# Patient Record
Sex: Female | Born: 1935 | Race: White | Hispanic: No | Smoking: Never smoker
Health system: Southern US, Community
[De-identification: ages and names within clinical notes are randomized; demographics above are authoritative.]

## PROBLEM LIST (undated history)

## (undated) DIAGNOSIS — N189 Chronic kidney disease, unspecified: Secondary | ICD-10-CM

## (undated) DIAGNOSIS — R519 Headache, unspecified: Secondary | ICD-10-CM

## (undated) DIAGNOSIS — N183 Chronic kidney disease, stage 3 unspecified: Secondary | ICD-10-CM

## (undated) DIAGNOSIS — J189 Pneumonia, unspecified organism: Secondary | ICD-10-CM

## (undated) DIAGNOSIS — R001 Bradycardia, unspecified: Secondary | ICD-10-CM

## (undated) DIAGNOSIS — R011 Cardiac murmur, unspecified: Secondary | ICD-10-CM

## (undated) DIAGNOSIS — I10 Essential (primary) hypertension: Secondary | ICD-10-CM

## (undated) DIAGNOSIS — C801 Malignant (primary) neoplasm, unspecified: Secondary | ICD-10-CM

## (undated) DIAGNOSIS — M1990 Unspecified osteoarthritis, unspecified site: Secondary | ICD-10-CM

## (undated) DIAGNOSIS — R51 Headache: Secondary | ICD-10-CM

## (undated) DIAGNOSIS — E039 Hypothyroidism, unspecified: Secondary | ICD-10-CM

## (undated) DIAGNOSIS — N179 Acute kidney failure, unspecified: Secondary | ICD-10-CM

## (undated) DIAGNOSIS — Z9889 Other specified postprocedural states: Secondary | ICD-10-CM

## (undated) DIAGNOSIS — C4490 Unspecified malignant neoplasm of skin, unspecified: Secondary | ICD-10-CM

## (undated) DIAGNOSIS — K219 Gastro-esophageal reflux disease without esophagitis: Secondary | ICD-10-CM

## (undated) DIAGNOSIS — N390 Urinary tract infection, site not specified: Secondary | ICD-10-CM

## (undated) DIAGNOSIS — R112 Nausea with vomiting, unspecified: Secondary | ICD-10-CM

## (undated) DIAGNOSIS — I248 Other forms of acute ischemic heart disease: Secondary | ICD-10-CM

## (undated) HISTORY — PX: COLONOSCOPY: SHX174

## (undated) HISTORY — PX: JOINT REPLACEMENT: SHX530

## (undated) HISTORY — PX: EYE SURGERY: SHX253

## (undated) HISTORY — DX: Chronic kidney disease, stage 3 unspecified: N18.30

## (undated) HISTORY — DX: Chronic kidney disease, stage 3 (moderate): N18.3

## (undated) HISTORY — DX: Cardiac murmur, unspecified: R01.1

## (undated) HISTORY — PX: TONSILLECTOMY: SUR1361

## (undated) HISTORY — PX: VAGINAL HYSTERECTOMY: SUR661

## (undated) HISTORY — PX: BILATERAL CARPAL TUNNEL RELEASE: SHX6508

---

## 1999-06-07 ENCOUNTER — Inpatient Hospital Stay (HOSPITAL_COMMUNITY): Admission: RE | Admit: 1999-06-07 | Discharge: 1999-06-11 | Payer: Self-pay | Admitting: Orthopedic Surgery

## 1999-06-07 ENCOUNTER — Encounter: Payer: Self-pay | Admitting: Orthopedic Surgery

## 2000-10-24 ENCOUNTER — Other Ambulatory Visit: Admission: RE | Admit: 2000-10-24 | Discharge: 2000-10-24 | Payer: Self-pay | Admitting: *Deleted

## 2004-02-16 ENCOUNTER — Ambulatory Visit: Payer: Self-pay | Admitting: Critical Care Medicine

## 2004-03-17 ENCOUNTER — Ambulatory Visit: Payer: Self-pay | Admitting: Critical Care Medicine

## 2004-05-25 ENCOUNTER — Ambulatory Visit: Payer: Self-pay | Admitting: Critical Care Medicine

## 2005-05-04 ENCOUNTER — Encounter: Admission: RE | Admit: 2005-05-04 | Discharge: 2005-05-04 | Payer: Self-pay | Admitting: Endocrinology

## 2007-08-18 ENCOUNTER — Encounter: Admission: RE | Admit: 2007-08-18 | Discharge: 2007-08-18 | Payer: Self-pay | Admitting: Specialist

## 2007-08-21 ENCOUNTER — Emergency Department (HOSPITAL_COMMUNITY): Admission: EM | Admit: 2007-08-21 | Discharge: 2007-08-21 | Payer: Self-pay | Admitting: Emergency Medicine

## 2010-07-28 NOTE — Op Note (Signed)
[place]. [facility]  Patient:    GRACLYNN, VANANTWERP                         MRN: [id number] Proc. Date: 06/07/99 Adm. Date:  [id number] Attending:  Twana First                           Operative Report  PREOPERATIVE DIAGNOSIS:  Left knee degenerative joint disease.  POSTOPERATIVE DIAGNOSIS:  Left knee degenerative joint disease.  PROCEDURES: 1. Left total knee replacement using Osteonics Scorpio total knee system with #5    cemented femur, #5 cemented tibial tray with 12 mm polyethylene spacer with    26 mm polyethylene cemented patella. 2. Left knee lateral retinacular release.  SURGEON:  Elana Alm. Thurston Hole, M.D.  ASSISTANT:  Kirstin Adelberger, P.A.  ANESTHESIA:  General.  OPERATIVE TIME:  1 hour and 25 minutes.  COMPLICATIONS:  None.  DESCRIPTION OF PROCEDURE:  Ms. Hedman was brought to the operating room on June 07, 1999, placed on the operating table in supine position.  After an adequate level of general anesthesia was obtained, she had Foley catheter placed under sterile conditions.  She received 1 g IV Ancef preoperatively for prophylaxis. Her left knee was examined under anesthesia.  Range of motion from -3 to 115 degrees, mild varus deformity.  Knee stable ligamentous examination.  After this was done, knee was prepped using sterile Betadine and draped using sterile technique. Leg was exsanguinated and a thigh tourniquet elevated 350 mm.  Initially, through a 25 cm longitudinal anterior incision, initial exposure was made.  The underlying subcutaneous tissues were incised in line with the skin incision.  A median arthrotomy was performed revealing an excessive amount of normal-appearing joint fluid.   Found to have significant degenerative changes, grade 4 changes medially and in the patellofemoral joint, grade 3 changes laterally.  The medial and lateral meniscal remnants were removed, as well as, the ACL, as well as,  the osteophytes off the femoral condyles and the patella.  Intramedullary drill was then drilled up the femoral canal for placement of the distal femoral cutting jig, which was placed in the approximately amount of rotation and set and then a distal 12 mm cut was  made removing the distal femoral condyles.  The distal femur was then incised. A #5 was felt to be the appropriate size, a #5 cutting jig placed and then the anterior, posterior and champhor cuts were made.   The proximal tibia was exposed. The tibial spines were removed with an oscillating saw.  Proximal tibia was sized. A #5 was felt to be the appropriate size.  An intramedullary drill drilled down the proximal tibial canal for placement of the proximal tibial cutting jig, which was then placed and a 4 mm proximal cut was made in the appropriate amount of rotation. At this point, the Scorpio PCL-sacrificing cutter was placed on the femur and these cuts were made without complications.  The #5 femoral trial was then placed, the #5 tibial trial was then placed with a 12 mm polyethylene spacer.  Found to be an excellent fit.  Range of motion zero to 125 degrees with excellent stability and restoration of normal alignment.  The tibial tray was then marked for rotation nd then the Keel cut was made.  The patella was then sized.  A 26 mm was felt to be the appropriate size and  a recessed 10 mm x 26 mm cut was made and three locking holes placed.  Excess bone removed from around the edges.  At this point, it was felt that all the trial components were of excellent size, fit and stability. he knee was then jet lavage irrigated with 3 L of saline solution.  The proximal tibial was then exposed.  The actual #5 tibial base plate with cement backing was hammered into position with an excellent fit.  Excess cement being removed from  around the edges.  The femoral component was hammered into position, also with n excellent  fit with excess cement being removed from around the edges.  The 12 mm polyethylene spacer was locked on the tibial base plate and the knee taken through a range of motion zero to 120 degrees with no lift off on the tray and excellent stability and norma alignment.  The patella button was locked into its recess hole and held there with a clamp.  Again, with excess cement being removed.  After all the cement hardened, patellofemoral tracking was evaluated.  There was some excessive tightness laterally and thus a lateral retinacular release was carried out improving patellofemoral tracking to normal.  At this point, it was felt that all of the components were of excellent size, fit and stability.  The wound was  further irrigated and then closed using #2 Panocryl suture over two medium Hemovac drains.  Subcutaneous tissues were closed with 0 and 2-0 Vicryl.  Skin closed with skin staples.  Sterile dressings were applied.  The Hemovac injected with 0.25%  Marcaine with epinephrine and then clamped.  The patient then turned lateral, where an epidural catheter was placed for postoperative pain control.  She was then awakened and taken to recovery room in stable condition.  Needle and sponge counts were correct x 2 at the end of the case. DD:  06/07/99 TD:  06/07/99 Job: [id number]

## 2010-07-28 NOTE — Discharge Summary (Signed)
[place]. [facility]  Patient:    April Hodges, April Hodges                           MRN: [id number] Adm. Date:  06/07/99 Disc. Date: 06/11/99 Attending:  Elana Alm. Thurston Hole, M.D. Dictator:   Product manager, P.A.                           Discharge Summary  ADMITTING DIAGNOSES:  1. End-stage degenerative joint disease, left knee.  2. Hypertension.  3. Hypothyroidism.   DISCHARGE DIAGNOSES:  1. End-stage degenerative joint disease of left knee, status post total knee     replacement.  2. Hypothyroidism.  3. Hypertension.  HISTORY OF PRESENT ILLNESS: The patient is a 77 year old white female who has end-stage DJD of her left knee, and continues to have pain with activity and pain at rest despite anti-inflammatories and cortisone injection.  She understands the risks and benefits and possible complications of a total knee replacement and is without questions.  PROCEDURES WHILE IN-HOUSE: On June 07, 1999 the patient underwent left total knee replacement.  HOSPITAL COURSE: The patient was admitted postoperatively for pain control, physical therapy, and DVT prophylaxis.  On postoperative day #1 the patient was up with physical therapy and tolerated this well.  She was begun on Coumadin.  Hemoglobin was 10.4.  On postoperative day #2 hemoglobin was once again 10.4 and on postoperative day #3 hemoglobin was 10.3.  On postoperative day #4 hemoglobin was 10.9.  The patient tolerated physical therapy well and was up on a walker on postoperative day #1 and ambulating well.  On postoperative day #3 the patient continued to improve and discharge planning was begun.  On postoperative day #4 the patient was discharged home.  DISCHARGE MEDICATIONS:  1. Home medications.  2. Percocet 1-2 p.o. q.4h to q.6h p.r.n. pain.  3. Colace 100 mg 1 p.o. b.i.d. with food.  4. Coumadin.  DISCHARGE INSTRUCTIONS: She had a home CPM, home health PT, and home health R.N. for PT  draws.  FOLLOW-UP: She will follow up in our office in two weeks.  DISPOSITION: She was discharged home.  DISCHARGE CONDITION: Stable. DD:  07/05/99 TD:  07/05/99 Job: [id number]

## 2010-07-28 NOTE — Procedures (Signed)
[place]. [facility]  Patient:    April Hodges, April Hodges                         MRN: [id number] Proc. Date: 06/07/99 Adm. Date:  [id number] Attending:  Twana First CC:         Anesthesia Dept                           Procedure Report  I was consulted by Elana Alm. Thurston Hole, M.D. to provide postoperative pain relief or his patient, [name].  We decided this should take the form of epidural catheter placement and postoperative monitoring by the anesthesiologist.  The risks and benefits of the procedure were discussed with the patient in the preoperative period.  The patient was aware of the risks and benefits and agreeable to the procedure.  At the completion of the operative procedure, the patient was turned to the left lateral decubitus position.  The back was prepped with Betadine solution x 3. rom a sterile epidural tray, a #17 gauge Tuohy needle was used in a paramedian approach at the L1-2 interspace.  A loss of resistance technique with air was used. The epidural space was located easily on the first attempt.  A Buerhenne catheter was passed 5 cm into the epidural space.  The needle was removed without apparent movement of the catheter.  The catheter was aspirated.  There was no evidence of cerebrospinal fluid or blood.  The catheter was injected with a solution containing 5 cc of 1% lidocaine plain and 10 cc of sterile preservative free normal saline. The catheter injected easily.  Repeat aspiration revealed no evidence of cerebrospinal fluid or blood.  The catheter was taped securely in place.  The patient was placed in the supine position and emerged uneventfully from the anesthetic.  The patient was brought to the recovery in good condition. DD:  06/07/99 TD:  06/07/99 Job: [id number]

## 2010-12-07 LAB — CBC
HCT: 42.3
Hemoglobin: 14.6
MCHC: 34.6
MCV: 90.7
Platelets: 201
RBC: 4.67
RDW: 13
WBC: 6.2

## 2010-12-07 LAB — BASIC METABOLIC PANEL
BUN: 27 — ABNORMAL HIGH
CO2: 26
Calcium: 9.8
Chloride: 103
Creatinine, Ser: 0.96
GFR calc Af Amer: >60
GFR calc non Af Amer: 56 — ABNORMAL LOW
Glucose, Bld: 100 — ABNORMAL HIGH
Potassium: 4.3
Sodium: 140

## 2010-12-07 LAB — POCT CARDIAC MARKERS
CKMB, poc: 1.6
Myoglobin, poc: 121
Operator id: 264031
Troponin i, poc: <0.05

## 2010-12-07 LAB — DIFFERENTIAL
Basophils Absolute: 0
Basophils Relative: 0
Eosinophils Absolute: 0
Eosinophils Relative: 1
Lymphocytes Relative: 19
Lymphs Abs: 1.2
Monocytes Absolute: 0.3
Monocytes Relative: 5
Neutro Abs: 4.6
Neutrophils Relative %: 75

## 2013-12-01 ENCOUNTER — Other Ambulatory Visit (HOSPITAL_COMMUNITY): Payer: Self-pay | Admitting: Geriatric Medicine

## 2013-12-01 ENCOUNTER — Ambulatory Visit (HOSPITAL_COMMUNITY): Payer: Medicare Other | Attending: Cardiology | Admitting: Radiology

## 2013-12-01 DIAGNOSIS — R011 Cardiac murmur, unspecified: Secondary | ICD-10-CM

## 2013-12-01 DIAGNOSIS — I379 Nonrheumatic pulmonary valve disorder, unspecified: Secondary | ICD-10-CM | POA: Insufficient documentation

## 2013-12-01 DIAGNOSIS — I10 Essential (primary) hypertension: Secondary | ICD-10-CM | POA: Insufficient documentation

## 2013-12-01 DIAGNOSIS — E7800 Pure hypercholesterolemia, unspecified: Secondary | ICD-10-CM | POA: Diagnosis not present

## 2013-12-01 DIAGNOSIS — E039 Hypothyroidism, unspecified: Secondary | ICD-10-CM | POA: Diagnosis not present

## 2013-12-01 NOTE — Progress Notes (Signed)
Echocardiogram performed.  

## 2014-02-22 ENCOUNTER — Other Ambulatory Visit: Payer: Self-pay | Admitting: Physical Medicine and Rehabilitation

## 2014-02-22 DIAGNOSIS — M48061 Spinal stenosis, lumbar region without neurogenic claudication: Secondary | ICD-10-CM

## 2014-02-23 ENCOUNTER — Ambulatory Visit
Admission: RE | Admit: 2014-02-23 | Discharge: 2014-02-23 | Disposition: A | Discharge status: Home / Self Care | Payer: Medicare Other | Source: Ambulatory Visit | Attending: Physical Medicine and Rehabilitation | Admitting: Physical Medicine and Rehabilitation

## 2014-02-23 DIAGNOSIS — M48061 Spinal stenosis, lumbar region without neurogenic claudication: Secondary | ICD-10-CM

## 2014-02-23 MED ORDER — ONDANSETRON HCL 4 MG/2ML IJ SOLN
4.0000 mg | Freq: Once | INTRAMUSCULAR | Status: AC
  Administered 2014-02-23: 4 mg via INTRAMUSCULAR

## 2014-02-23 MED ORDER — METHYLPREDNISOLONE ACETATE 40 MG/ML INJ SUSP (RADIOLOG
120.0000 mg | Freq: Once | INTRAMUSCULAR | Status: AC
  Administered 2014-02-23: 120 mg via EPIDURAL

## 2014-02-23 MED ORDER — IOHEXOL 180 MG/ML  SOLN
1.0000 mL | Freq: Once | INTRAMUSCULAR | Status: AC | PRN
  Administered 2014-02-23: 1 mL via EPIDURAL

## 2014-02-23 MED ORDER — MEPERIDINE HCL 100 MG/ML IJ SOLN
75.0000 mg | Freq: Once | INTRAMUSCULAR | Status: AC
  Administered 2014-02-23: 75 mg via INTRAMUSCULAR

## 2014-02-23 NOTE — Discharge Instructions (Signed)

## 2014-06-09 ENCOUNTER — Encounter (HOSPITAL_COMMUNITY): Payer: Self-pay | Admitting: *Deleted

## 2014-06-09 MED ORDER — MUPIROCIN 2 % EX OINT
1.0000 "application " | TOPICAL_OINTMENT | Freq: Once | CUTANEOUS | Status: AC
  Administered 2014-06-10: 1 via TOPICAL
  Filled 2014-06-09: qty 22

## 2014-06-09 MED ORDER — CEFAZOLIN SODIUM-DEXTROSE 2-3 GM-% IV SOLR
2.0000 g | INTRAVENOUS | Status: AC
  Administered 2014-06-10: 2 g via INTRAVENOUS
  Filled 2014-06-09: qty 50

## 2014-06-10 ENCOUNTER — Inpatient Hospital Stay (HOSPITAL_COMMUNITY)
Admission: RE | Admit: 2014-06-10 | Discharge: 2014-06-14 | DRG: 519 | Disposition: A | Discharge status: Skilled Nursing Facility | Payer: Medicare Other | Source: Ambulatory Visit | Attending: Orthopedic Surgery | Admitting: Orthopedic Surgery

## 2014-06-10 ENCOUNTER — Inpatient Hospital Stay (HOSPITAL_COMMUNITY): Payer: Medicare Other | Admitting: Anesthesiology

## 2014-06-10 ENCOUNTER — Other Ambulatory Visit (HOSPITAL_COMMUNITY): Payer: Self-pay

## 2014-06-10 ENCOUNTER — Encounter (HOSPITAL_COMMUNITY)
Admission: RE | Disposition: A | Discharge status: Skilled Nursing Facility | Payer: Self-pay | Source: Ambulatory Visit | Attending: Orthopedic Surgery

## 2014-06-10 ENCOUNTER — Inpatient Hospital Stay (HOSPITAL_COMMUNITY): Payer: Medicare Other

## 2014-06-10 ENCOUNTER — Encounter (HOSPITAL_COMMUNITY): Payer: Self-pay | Admitting: *Deleted

## 2014-06-10 DIAGNOSIS — Z96653 Presence of artificial knee joint, bilateral: Secondary | ICD-10-CM | POA: Diagnosis present

## 2014-06-10 DIAGNOSIS — M4316 Spondylolisthesis, lumbar region: Secondary | ICD-10-CM | POA: Diagnosis present

## 2014-06-10 DIAGNOSIS — E039 Hypothyroidism, unspecified: Secondary | ICD-10-CM | POA: Diagnosis present

## 2014-06-10 DIAGNOSIS — R001 Bradycardia, unspecified: Secondary | ICD-10-CM | POA: Diagnosis present

## 2014-06-10 DIAGNOSIS — Z85828 Personal history of other malignant neoplasm of skin: Secondary | ICD-10-CM

## 2014-06-10 DIAGNOSIS — Z90711 Acquired absence of uterus with remaining cervical stump: Secondary | ICD-10-CM | POA: Diagnosis present

## 2014-06-10 DIAGNOSIS — M5416 Radiculopathy, lumbar region: Secondary | ICD-10-CM | POA: Diagnosis present

## 2014-06-10 DIAGNOSIS — Y658 Other specified misadventures during surgical and medical care: Secondary | ICD-10-CM | POA: Diagnosis not present

## 2014-06-10 DIAGNOSIS — K219 Gastro-esophageal reflux disease without esophagitis: Secondary | ICD-10-CM | POA: Diagnosis present

## 2014-06-10 DIAGNOSIS — Y92234 Operating room of hospital as the place of occurrence of the external cause: Secondary | ICD-10-CM

## 2014-06-10 DIAGNOSIS — G960 Cerebrospinal fluid leak: Secondary | ICD-10-CM | POA: Diagnosis not present

## 2014-06-10 DIAGNOSIS — R110 Nausea: Secondary | ICD-10-CM | POA: Diagnosis not present

## 2014-06-10 DIAGNOSIS — I10 Essential (primary) hypertension: Secondary | ICD-10-CM | POA: Diagnosis present

## 2014-06-10 DIAGNOSIS — K5900 Constipation, unspecified: Secondary | ICD-10-CM | POA: Diagnosis not present

## 2014-06-10 DIAGNOSIS — B029 Zoster without complications: Secondary | ICD-10-CM | POA: Diagnosis present

## 2014-06-10 DIAGNOSIS — Z79899 Other long term (current) drug therapy: Secondary | ICD-10-CM

## 2014-06-10 DIAGNOSIS — M549 Dorsalgia, unspecified: Secondary | ICD-10-CM | POA: Diagnosis present

## 2014-06-10 DIAGNOSIS — M4806 Spinal stenosis, lumbar region: Secondary | ICD-10-CM | POA: Diagnosis present

## 2014-06-10 DIAGNOSIS — G9781 Other intraoperative complications of nervous system: Secondary | ICD-10-CM | POA: Diagnosis not present

## 2014-06-10 DIAGNOSIS — Z419 Encounter for procedure for purposes other than remedying health state, unspecified: Secondary | ICD-10-CM

## 2014-06-10 HISTORY — DX: Gastro-esophageal reflux disease without esophagitis: K21.9

## 2014-06-10 HISTORY — DX: Other specified postprocedural states: Z98.890

## 2014-06-10 HISTORY — DX: Pneumonia, unspecified organism: J18.9

## 2014-06-10 HISTORY — DX: Headache, unspecified: R51.9

## 2014-06-10 HISTORY — DX: Malignant (primary) neoplasm, unspecified: C80.1

## 2014-06-10 HISTORY — DX: Bradycardia, unspecified: R00.1

## 2014-06-10 HISTORY — DX: Unspecified osteoarthritis, unspecified site: M19.90

## 2014-06-10 HISTORY — DX: Hypothyroidism, unspecified: E03.9

## 2014-06-10 HISTORY — PX: LUMBAR LAMINECTOMY/DECOMPRESSION MICRODISCECTOMY: SHX5026

## 2014-06-10 HISTORY — DX: Headache: R51

## 2014-06-10 HISTORY — DX: Other specified postprocedural states: R11.2

## 2014-06-10 LAB — BASIC METABOLIC PANEL
Anion gap: 11 (ref 5–15)
BUN: 27 mg/dL — AB (ref 6–23)
CHLORIDE: 105 mmol/L (ref 96–112)
CO2: 24 mmol/L (ref 19–32)
CREATININE: 1.02 mg/dL (ref 0.50–1.10)
Calcium: 9.9 mg/dL (ref 8.4–10.5)
GFR calc Af Amer: 56 mL/min — ABNORMAL LOW (ref >90)
GFR calc non Af Amer: 48 mL/min — ABNORMAL LOW (ref >90)
Glucose, Bld: 96 mg/dL (ref 70–99)
Potassium: 4.2 mmol/L (ref 3.5–5.1)
Sodium: 140 mmol/L (ref 135–145)

## 2014-06-10 LAB — CBC
HCT: 43.1 % (ref 36.0–46.0)
Hemoglobin: 14.2 g/dL (ref 12.0–15.0)
MCH: 31.3 pg (ref 26.0–34.0)
MCHC: 32.9 g/dL (ref 30.0–36.0)
MCV: 94.9 fL (ref 78.0–100.0)
PLATELETS: 198 10*3/uL (ref 150–400)
RBC: 4.54 MIL/uL (ref 3.87–5.11)
RDW: 12.8 % (ref 11.5–15.5)
WBC: 5.6 10*3/uL (ref 4.0–10.5)

## 2014-06-10 LAB — SURGICAL PCR SCREEN
MRSA, PCR: NEGATIVE
Staphylococcus aureus: POSITIVE — AB

## 2014-06-10 SURGERY — LUMBAR LAMINECTOMY/DECOMPRESSION MICRODISCECTOMY 2 LEVELS
Anesthesia: General | Site: Back

## 2014-06-10 MED ORDER — GLYCOPYRROLATE 0.2 MG/ML IJ SOLN
INTRAMUSCULAR | Status: AC
  Filled 2014-06-10: qty 2

## 2014-06-10 MED ORDER — HYDROMORPHONE HCL 1 MG/ML IJ SOLN
INTRAMUSCULAR | Status: AC
  Administered 2014-06-10: 0.5 mg via INTRAVENOUS
  Filled 2014-06-10: qty 1

## 2014-06-10 MED ORDER — LACTATED RINGERS IV SOLN: INTRAVENOUS | Status: DC

## 2014-06-10 MED ORDER — FENTANYL CITRATE 0.05 MG/ML IJ SOLN
INTRAMUSCULAR | Status: AC
  Filled 2014-06-10: qty 5

## 2014-06-10 MED ORDER — LACTATED RINGERS IV SOLN
INTRAVENOUS | Status: DC | PRN
  Administered 2014-06-10 (×2): via INTRAVENOUS

## 2014-06-10 MED ORDER — NEOSTIGMINE METHYLSULFATE 10 MG/10ML IV SOLN
INTRAVENOUS | Status: AC
  Filled 2014-06-10: qty 1

## 2014-06-10 MED ORDER — ONDANSETRON HCL 4 MG/2ML IJ SOLN
4.0000 mg | INTRAMUSCULAR | Status: DC | PRN
  Administered 2014-06-10: 4 mg via INTRAVENOUS
  Filled 2014-06-10: qty 2

## 2014-06-10 MED ORDER — ACETAMINOPHEN 10 MG/ML IV SOLN
1000.0000 mg | Freq: Four times a day (QID) | INTRAVENOUS | Status: AC
  Administered 2014-06-10 – 2014-06-11 (×4): 1000 mg via INTRAVENOUS
  Filled 2014-06-10 (×4): qty 100

## 2014-06-10 MED ORDER — MENTHOL 3 MG MT LOZG: 1.0000 | LOZENGE | OROMUCOSAL | Status: DC | PRN

## 2014-06-10 MED ORDER — OXYCODONE HCL 5 MG PO TABS
10.0000 mg | ORAL_TABLET | ORAL | Status: DC | PRN
  Administered 2014-06-11: 5 mg via ORAL
  Administered 2014-06-11: 10 mg via ORAL
  Filled 2014-06-10 (×2): qty 2

## 2014-06-10 MED ORDER — EPHEDRINE SULFATE 50 MG/ML IJ SOLN
INTRAMUSCULAR | Status: DC | PRN
  Administered 2014-06-10 (×3): 10 mg via INTRAVENOUS

## 2014-06-10 MED ORDER — GLYCOPYRROLATE 0.2 MG/ML IJ SOLN
INTRAMUSCULAR | Status: DC | PRN
  Administered 2014-06-10: 0.4 mg via INTRAVENOUS

## 2014-06-10 MED ORDER — ONDANSETRON HCL 4 MG/2ML IJ SOLN: 4.0000 mg | Freq: Once | INTRAMUSCULAR | Status: DC | PRN

## 2014-06-10 MED ORDER — FENTANYL CITRATE 0.05 MG/ML IJ SOLN
INTRAMUSCULAR | Status: DC | PRN
  Administered 2014-06-10: 25 ug via INTRAVENOUS
  Administered 2014-06-10: 50 ug via INTRAVENOUS
  Administered 2014-06-10 (×2): 100 ug via INTRAVENOUS
  Administered 2014-06-10: 50 ug via INTRAVENOUS

## 2014-06-10 MED ORDER — DEXAMETHASONE 4 MG PO TABS
4.0000 mg | ORAL_TABLET | Freq: Four times a day (QID) | ORAL | Status: DC
  Administered 2014-06-11 – 2014-06-12 (×3): 4 mg via ORAL
  Filled 2014-06-10 (×4): qty 1

## 2014-06-10 MED ORDER — LIDOCAINE HCL (CARDIAC) 20 MG/ML IV SOLN
INTRAVENOUS | Status: DC | PRN
  Administered 2014-06-10: 100 mg via INTRAVENOUS

## 2014-06-10 MED ORDER — METHOCARBAMOL 1000 MG/10ML IJ SOLN
500.0000 mg | Freq: Four times a day (QID) | INTRAVENOUS | Status: DC | PRN
  Administered 2014-06-10: 500 mg via INTRAVENOUS
  Filled 2014-06-10 (×3): qty 5

## 2014-06-10 MED ORDER — SODIUM CHLORIDE 0.9 % IV SOLN
250.0000 mL | INTRAVENOUS | Status: DC
  Administered 2014-06-12: 250 mL via INTRAVENOUS

## 2014-06-10 MED ORDER — ONDANSETRON HCL 4 MG/2ML IJ SOLN
INTRAMUSCULAR | Status: DC | PRN
  Administered 2014-06-10: 4 mg via INTRAVENOUS

## 2014-06-10 MED ORDER — NEOSTIGMINE METHYLSULFATE 10 MG/10ML IV SOLN
INTRAVENOUS | Status: DC | PRN
  Administered 2014-06-10: 3 mg via INTRAVENOUS

## 2014-06-10 MED ORDER — LACTATED RINGERS IV SOLN
INTRAVENOUS | Status: DC
  Administered 2014-06-10: 11:00:00 via INTRAVENOUS

## 2014-06-10 MED ORDER — DEXAMETHASONE SODIUM PHOSPHATE 4 MG/ML IJ SOLN
4.0000 mg | Freq: Four times a day (QID) | INTRAMUSCULAR | Status: DC
  Administered 2014-06-10 – 2014-06-12 (×4): 4 mg via INTRAVENOUS
  Filled 2014-06-10 (×4): qty 1

## 2014-06-10 MED ORDER — HEMOSTATIC AGENTS (NO CHARGE) OPTIME
TOPICAL | Status: DC | PRN
  Administered 2014-06-10: 1 via TOPICAL

## 2014-06-10 MED ORDER — PHENYLEPHRINE HCL 10 MG/ML IJ SOLN
INTRAMUSCULAR | Status: DC | PRN
  Administered 2014-06-10 (×2): 40 ug via INTRAVENOUS
  Administered 2014-06-10: 80 ug via INTRAVENOUS

## 2014-06-10 MED ORDER — PHENOL 1.4 % MT LIQD: 1.0000 | OROMUCOSAL | Status: DC | PRN

## 2014-06-10 MED ORDER — AMLODIPINE BESYLATE 2.5 MG PO TABS
2.5000 mg | ORAL_TABLET | Freq: Every day | ORAL | Status: DC
  Administered 2014-06-11 – 2014-06-14 (×4): 2.5 mg via ORAL
  Filled 2014-06-10 (×5): qty 1

## 2014-06-10 MED ORDER — PROPOFOL 10 MG/ML IV BOLUS
INTRAVENOUS | Status: DC | PRN
Start: 2014-06-10 — End: 2014-06-10
  Administered 2014-06-10: 120 mg via INTRAVENOUS

## 2014-06-10 MED ORDER — THROMBIN 20000 UNITS EX SOLR
CUTANEOUS | Status: DC | PRN
  Administered 2014-06-10: 20000 [IU] via TOPICAL

## 2014-06-10 MED ORDER — ONDANSETRON HCL 4 MG/2ML IJ SOLN
INTRAMUSCULAR | Status: AC
  Filled 2014-06-10: qty 2

## 2014-06-10 MED ORDER — HYDROMORPHONE HCL 1 MG/ML IJ SOLN
0.2500 mg | INTRAMUSCULAR | Status: DC | PRN
  Administered 2014-06-10 (×4): 0.5 mg via INTRAVENOUS

## 2014-06-10 MED ORDER — THROMBIN 20000 UNITS EX SOLR
CUTANEOUS | Status: AC
  Filled 2014-06-10: qty 20000

## 2014-06-10 MED ORDER — PHENYLEPHRINE 40 MCG/ML (10ML) SYRINGE FOR IV PUSH (FOR BLOOD PRESSURE SUPPORT)
PREFILLED_SYRINGE | INTRAVENOUS | Status: AC
  Filled 2014-06-10: qty 10

## 2014-06-10 MED ORDER — METHOCARBAMOL 500 MG PO TABS
500.0000 mg | ORAL_TABLET | Freq: Four times a day (QID) | ORAL | Status: DC | PRN
  Administered 2014-06-12 – 2014-06-13 (×2): 500 mg via ORAL
  Filled 2014-06-10 (×3): qty 1

## 2014-06-10 MED ORDER — MEPERIDINE HCL 25 MG/ML IJ SOLN: 6.2500 mg | INTRAMUSCULAR | Status: DC | PRN

## 2014-06-10 MED ORDER — ROCURONIUM BROMIDE 100 MG/10ML IV SOLN
INTRAVENOUS | Status: DC | PRN
  Administered 2014-06-10: 50 mg via INTRAVENOUS

## 2014-06-10 MED ORDER — LEVOTHYROXINE SODIUM 125 MCG PO TABS
125.0000 ug | ORAL_TABLET | Freq: Every day | ORAL | Status: DC
  Administered 2014-06-12: 125 ug via ORAL
  Filled 2014-06-10: qty 1

## 2014-06-10 MED ORDER — BUPIVACAINE-EPINEPHRINE (PF) 0.25% -1:200000 IJ SOLN
INTRAMUSCULAR | Status: DC | PRN
  Administered 2014-06-10: 10 mL via PERINEURAL

## 2014-06-10 MED ORDER — PROMETHAZINE HCL 25 MG/ML IJ SOLN
12.5000 mg | Freq: Four times a day (QID) | INTRAMUSCULAR | Status: DC | PRN
  Administered 2014-06-10: 12.5 mg via INTRAVENOUS
  Filled 2014-06-10: qty 1

## 2014-06-10 MED ORDER — MORPHINE SULFATE 2 MG/ML IJ SOLN
1.0000 mg | INTRAMUSCULAR | Status: DC | PRN
  Filled 2014-06-10: qty 1

## 2014-06-10 MED ORDER — SODIUM CHLORIDE 0.9 % IJ SOLN
3.0000 mL | Freq: Two times a day (BID) | INTRAMUSCULAR | Status: DC
  Filled 2014-06-10: qty 3

## 2014-06-10 MED ORDER — BUPIVACAINE-EPINEPHRINE (PF) 0.25% -1:200000 IJ SOLN
INTRAMUSCULAR | Status: AC
  Filled 2014-06-10: qty 30

## 2014-06-10 MED ORDER — CEFAZOLIN SODIUM 1-5 GM-% IV SOLN
1.0000 g | Freq: Three times a day (TID) | INTRAVENOUS | Status: AC
  Administered 2014-06-10 – 2014-06-11 (×2): 1 g via INTRAVENOUS
  Filled 2014-06-10 (×2): qty 50

## 2014-06-10 MED ORDER — SODIUM CHLORIDE 0.9 % IJ SOLN: 3.0000 mL | INTRAMUSCULAR | Status: DC | PRN

## 2014-06-10 SURGICAL SUPPLY — 67 items
APL SRG 60D 8 XTD TIP BNDBL (TIP) ×1
BNDG GAUZE ELAST 4 BULKY (GAUZE/BANDAGES/DRESSINGS) ×3 IMPLANT
BUR EGG ELITE 4.0 (BURR) ×1 IMPLANT
BUR EGG ELITE 4.0MM (BURR) ×1
CLOSURE STERI-STRIP 1/2X4 (GAUZE/BANDAGES/DRESSINGS) ×2
CLOSURE WOUND 1/2 X4 (GAUZE/BANDAGES/DRESSINGS)
CLSR STERI-STRIP ANTIMIC 1/2X4 (GAUZE/BANDAGES/DRESSINGS) ×2 IMPLANT
CORDS BIPOLAR (ELECTRODE) ×3 IMPLANT
DRAIN CHANNEL 15F RND FF W/TCR (WOUND CARE) ×2 IMPLANT
DRAPE C-ARM 42X72 X-RAY (DRAPES) ×3 IMPLANT
DRAPE POUCH INSTRU U-SHP 10X18 (DRAPES) ×3 IMPLANT
DRAPE SURG 17X11 SM STRL (DRAPES) ×3 IMPLANT
DRAPE U-SHAPE 47X51 STRL (DRAPES) ×3 IMPLANT
DRSG MEPILEX BORDER 4X4 (GAUZE/BANDAGES/DRESSINGS) ×5 IMPLANT
DURAPREP 26ML APPLICATOR (WOUND CARE) ×3 IMPLANT
DURASEAL APPLICATOR TIP (TIP) ×2 IMPLANT
DURASEAL SPINE SEALANT 3ML (MISCELLANEOUS) ×2 IMPLANT
ELECT BLADE 4.0 EZ CLEAN MEGAD (MISCELLANEOUS) ×3
ELECT CAUTERY BLADE 6.4 (BLADE) ×3 IMPLANT
ELECT PENCIL ROCKER SW 15FT (MISCELLANEOUS) ×3 IMPLANT
ELECT REM PT RETURN 9FT ADLT (ELECTROSURGICAL) ×3
ELECTRODE BLDE 4.0 EZ CLN MEGD (MISCELLANEOUS) ×1 IMPLANT
ELECTRODE REM PT RTRN 9FT ADLT (ELECTROSURGICAL) ×1 IMPLANT
EVACUATOR SILICONE 100CC (DRAIN) ×2 IMPLANT
FLOSEAL (HEMOSTASIS) IMPLANT
GLOVE BIOGEL PI IND STRL 8 (GLOVE) ×1 IMPLANT
GLOVE BIOGEL PI IND STRL 8.5 (GLOVE) ×1 IMPLANT
GLOVE BIOGEL PI INDICATOR 8 (GLOVE) ×2
GLOVE BIOGEL PI INDICATOR 8.5 (GLOVE) ×2
GLOVE ORTHO TXT STRL SZ7.5 (GLOVE) ×3 IMPLANT
GLOVE SS BIOGEL STRL SZ 8.5 (GLOVE) ×1 IMPLANT
GLOVE SUPERSENSE BIOGEL SZ 8.5 (GLOVE) ×2
GOWN STRL REUS W/ TWL LRG LVL3 (GOWN DISPOSABLE) ×1 IMPLANT
GOWN STRL REUS W/TWL 2XL LVL3 (GOWN DISPOSABLE) ×6 IMPLANT
GOWN STRL REUS W/TWL LRG LVL3 (GOWN DISPOSABLE) ×3
GRAFT DURAGEN MATRIX 1WX1L (Tissue) ×2 IMPLANT
KIT BASIN OR (CUSTOM PROCEDURE TRAY) ×3 IMPLANT
NDL SPNL 18GX3.5 QUINCKE PK (NEEDLE) ×2 IMPLANT
NEEDLE 22X1 1/2 (OR ONLY) (NEEDLE) ×3 IMPLANT
NEEDLE SPNL 18GX3.5 QUINCKE PK (NEEDLE) ×6 IMPLANT
NS IRRIG 1000ML POUR BTL (IV SOLUTION) ×3 IMPLANT
PACK LAMINECTOMY ORTHO (CUSTOM PROCEDURE TRAY) ×3 IMPLANT
PACK UNIVERSAL I (CUSTOM PROCEDURE TRAY) ×3 IMPLANT
PATTIES SURGICAL .5 X.5 (GAUZE/BANDAGES/DRESSINGS) IMPLANT
PATTIES SURGICAL .5 X1 (DISPOSABLE) ×3 IMPLANT
SPONGE LAP 4X18 X RAY DECT (DISPOSABLE) ×2 IMPLANT
SPONGE SURGIFOAM ABS GEL 100 (HEMOSTASIS) ×3 IMPLANT
STAPLER VISISTAT 35W (STAPLE) IMPLANT
STRIP CLOSURE SKIN 1/2X4 (GAUZE/BANDAGES/DRESSINGS) IMPLANT
SURGIFLO TRUKIT (HEMOSTASIS) IMPLANT
SUT BONE WAX W31G (SUTURE) ×3 IMPLANT
SUT ETHILON 3 0 PS 1 (SUTURE) ×2 IMPLANT
SUT MON AB 3-0 SH 27 (SUTURE) ×3
SUT MON AB 3-0 SH27 (SUTURE) ×1 IMPLANT
SUT PROLENE 6 0 P 1 18 (SUTURE) ×2 IMPLANT
SUT VIC AB 1 CT1 18XCR BRD 8 (SUTURE) ×1 IMPLANT
SUT VIC AB 1 CT1 27 (SUTURE) ×6
SUT VIC AB 1 CT1 27XBRD ANTBC (SUTURE) ×2 IMPLANT
SUT VIC AB 1 CT1 8-18 (SUTURE) ×6
SUT VIC AB 2-0 CT1 18 (SUTURE) ×6 IMPLANT
SUT VICRYL 0 UR6 27IN ABS (SUTURE) ×3 IMPLANT
SYR BULB IRRIGATION 50ML (SYRINGE) ×3 IMPLANT
SYR CONTROL 10ML LL (SYRINGE) ×3 IMPLANT
TOWEL OR 17X26 10 PK STRL BLUE (TOWEL DISPOSABLE) ×6 IMPLANT
TRAY FOLEY CATH 16FRSI W/METER (SET/KITS/TRAYS/PACK) IMPLANT
WATER STERILE IRR 1000ML POUR (IV SOLUTION) ×3 IMPLANT
YANKAUER SUCT BULB TIP NO VENT (SUCTIONS) ×3 IMPLANT

## 2014-06-10 NOTE — Brief Op Note (Signed)
06/10/2014  5:51 PM  PATIENT:  April Hodges  81 y.o. female  PRE-OPERATIVE DIAGNOSIS:  spinal stenosis with slip L4 - L5  POST-OPERATIVE DIAGNOSIS:  spinal stenosis with slip L4 - L5  PROCEDURE:  Procedure(s): LUMBAR DECOMPRESSION L3 - L5 2 LEVELS (N/A)  SURGEON:  Surgeon(s) and Role:    * Melina Schools, MD - Primary  PHYSICIAN ASSISTANT:   ASSISTANTS: none   ANESTHESIA:   general  EBL:  Total I/O In: 1000 [I.V.:1000] Out: 450 [Urine:200; Blood:250]  BLOOD ADMINISTERED:none  DRAINS: 1 JP in the back   LOCAL MEDICATIONS USED:  MARCAINE     SPECIMEN:  No Specimen  DISPOSITION OF SPECIMEN:  N/A  COUNTS:  YES  TOURNIQUET:  * No tourniquets in log *  DICTATION: .Other Dictation: Dictation Number [id number]  PLAN OF CARE: Admit to inpatient   PATIENT DISPOSITION:  PACU - hemodynamically stable.   Complication: CSF leak.  Duragen patch with Duraseal    No active CSF leak afterwards - tested with valsavla to 40 for 10 sec.

## 2014-06-10 NOTE — Anesthesia Preprocedure Evaluation (Signed)
Anesthesia Evaluation  Patient identified by MRN, date of birth, ID band Patient awake    Reviewed: Allergy & Precautions, NPO status , Patient's Chart, lab work & pertinent test results  History of Anesthesia Complications (+) PONV  Airway Mallampati: I  TM Distance: >3 FB Neck ROM: Full    Dental   Pulmonary          Cardiovascular hypertension, Pt. on medications     Neuro/Psych    GI/Hepatic GERD-  Medicated and Controlled,  Endo/Other  Hypothyroidism   Renal/GU      Musculoskeletal   Abdominal   Peds  Hematology   Anesthesia Other Findings   Reproductive/Obstetrics                             Anesthesia Physical Anesthesia Plan  ASA: II  Anesthesia Plan: General   Post-op Pain Management:    Induction: Intravenous  Airway Management Planned: Oral ETT  Additional Equipment:   Intra-op Plan:   Post-operative Plan: Extubation in OR  Informed Consent: I have reviewed the patients History and Physical, chart, labs and discussed the procedure including the risks, benefits and alternatives for the proposed anesthesia with the patient or authorized representative who has indicated his/her understanding and acceptance.     Plan Discussed with: CRNA and Surgeon  Anesthesia Plan Comments:         Anesthesia Quick Evaluation

## 2014-06-10 NOTE — Transfer of Care (Signed)
Immediate Anesthesia Transfer of Care Note  Patient: April Hodges  Procedure(s) Performed: Procedure(s): LUMBAR DECOMPRESSION L3 - L5 2 LEVELS (N/A)  Patient Location: PACU  Anesthesia Type:General  Level of Consciousness: awake, alert , oriented and patient cooperative  Airway & Oxygen Therapy: Patient Spontanous Breathing and Patient connected to face mask oxygen  Post-op Assessment: Report given to RN, Post -op Vital signs reviewed and stable and Patient moving all extremities  Post vital signs: Reviewed and stable  Last Vitals:  Filed Vitals:   06/10/14 1811  BP:   Pulse:   Temp: 36.8 C  Resp:     Complications: No apparent anesthesia complications

## 2014-06-10 NOTE — H&P (Signed)
History of Present Illness The patient is a 81 year old female who presents with back pain. The patient is here today for a surgical consult (from Dr Nelva Bush). The patient reports low back and bilateral leg pain symptoms including low back pain, numbness (on/off) and tingling which began 4 month(s) ago without any known injury. and Symptoms include numbness (both legs), while symptoms do not include incontinence of stool or incontinence of urine. The pain radiates to the left foot and right foot. The patient describes the severity of their symptoms as 3 / 10 on an analog pain scale. The patient feels as if the symptoms are worsening (for several months). Symptoms are relieved by activity modification and heat packs (hot shower helps). Current treatment includes nonsteroidal anti-inflammatory drugs (Celebrex), non-opioid analgesics (Extra Strenght Tylenol) and heating pad. The patient states that the first episode of back pain that they can recall was year(s) (This pain has come and gone over the years. She has a history of shingles secondary to sudden onset of back pain.) ago. Prior to being seen today the patient was previously evaluated in this clinic (Dr Nelva Bush and chiropractor). Past evaluation has included MRI of the lumbar spine (lumbar @ [facility]). Past treatment has included chiropractic manipulation.  Subjective Transcription She presents today for evaluation.  Allergies No Known Drug Allergies12/03/2013  Family History Congestive Heart Failure Mother. Heart Disease Brother, Father, Mother. Osteoarthritis Mother.  Social History  Exercise Exercises rarely Children 3 Current work status retired No alcohol use 03-27-14 Tobacco use Never smoker. 03-27-14 Marital status widowed No history of drug/alcohol rehab Not under pain contract Current drinker 02/09/2014: Currently drinks wine only occasionally per week Living situation live alone  Medication History  Ultram (50MG  Tablet,  1 (one) Tablet Oral three times daily, as needed, Taken starting 03/08/2014) Active. CeleBREX (200MG  Capsule, Oral) Active. Aspirin (81MG  Tablet, 1 (one) Oral) Active. Multiple Vitamin (1 (one) Oral) Active. Tylenol (500MG  Capsule, 1 (one) Oral) Active. Triamterene-HCTZ (37.5-25MG  Tablet, Oral) Active. Levothyroxine Sodium (125MCG Tablet, Oral) Active. Omeprazole (20MG  Tablet DR, Oral) Active. AmLODIPine Besylate (2.5MG  Tablet, Oral) Active. Medications Reconciled  Past Surgical History  Arthroscopy of Knee left Carpal Tunnel Repair bilateral Cataract Surgery bilateral Hysterectomy partial (non-cancerous) Total Knee Replacement bilateral  Other Problems Skin Cancer Shingles last outbreak 2009 High blood pressure Hypothyroidism Osteoarthritis  Objective Transcription She is a pleasant woman who appears younger than her stated age. She is alert, she is oriented times three. No shortness of breath or chest pain. The abdomen is soft and nontender. No incontinence of bowel and bladder. She has got significant back pain with extension of the spine. No previous surgical scars on the lumbar spine. Relief with forward flexion, pain that radiates into the buttock and into both hamstrings or occasionally below the knees. Worse again with extension of the spine. Compartments are soft and nontender. Intact 1+ deep tendon dorsalis pedis and posterior tibialis pulses. 1+ deep tendon reflexes. She has got bilateral total knee replacements. No clonus. She does have difficulty maintaining her balance primarily due to the onset of pain.  Her MRI was reviewed from 04/19/14. It demonstrates significant spinal stenosis at L3-4 and L4-5. There is a grade 1 anterior listhesis at L4-5. There is severe central and lateral recess stenosis at 4-5. This is the worst of the two levels. There is mild disease at 2-3 and there is degenerative disc disease with moderate facet  arthrosis at 5-1.  Assessment & Plan  Spinal stenosis of lumbar region with  radiculopathy  Current Plans Follow up as needed Plans Transcription At this point in time clinically I think the spinal stenosis at 3-4 and 4-5 are her primary sources of pain. We have talked about a surgical solution for her pain. She had a previous epidural injection which was complicated with significant increase in her pain. She is not too thrilled with the idea of repeating the injections. At this point we have talked about a decompressive procedure. I have talked to the patient and her daughter in law. They are present for the dictation, all of their questions were addressed. The risks of surgery include infection, bleeding, nerve damage, death, stroke, paralysis, failure to heal, need for further surgery, ongoing or worse pain, loss of bowel and bladder control, migration of device, recurrent spinal stenosis, wound infection. All of their questions were encouraged and addressed. She will take some time and contact me and let me know how she would like to proceed.

## 2014-06-11 LAB — CBC
HEMATOCRIT: 39.2 % (ref 36.0–46.0)
Hemoglobin: 12.7 g/dL (ref 12.0–15.0)
MCH: 30.6 pg (ref 26.0–34.0)
MCHC: 32.4 g/dL (ref 30.0–36.0)
MCV: 94.5 fL (ref 78.0–100.0)
Platelets: 183 10*3/uL (ref 150–400)
RBC: 4.15 MIL/uL (ref 3.87–5.11)
RDW: 12.8 % (ref 11.5–15.5)
WBC: 8 10*3/uL (ref 4.0–10.5)

## 2014-06-11 MED ORDER — CEFAZOLIN SODIUM 1-5 GM-% IV SOLN
1.0000 g | Freq: Three times a day (TID) | INTRAVENOUS | Status: AC
  Administered 2014-06-12 (×2): 1 g via INTRAVENOUS
  Filled 2014-06-11 (×2): qty 50

## 2014-06-11 MED ORDER — CEFAZOLIN SODIUM 1-5 GM-% IV SOLN
1.0000 g | Freq: Three times a day (TID) | INTRAVENOUS | Status: DC
  Administered 2014-06-11: 1 g via INTRAVENOUS
  Filled 2014-06-11 (×4): qty 50

## 2014-06-11 NOTE — Progress Notes (Addendum)
    Subjective: Procedure(s) (LRB): LUMBAR DECOMPRESSION L3 - L5 2 LEVELS (N/A) 1 Day Post-Op  Patient reports pain as 3 on 0-10 scale.  Reports decreased leg pain reports incisional back pain   N/A void - foley in place Negative bowel movement Negative flatus Negative chest pain or shortness of breath  Objective: Vital signs in last 24 hours: Temp:  [97.5 F (36.4 C)-98.3 F (36.8 C)] 98.1 F (36.7 C) (04/01 0600) Pulse Rate:  [56-94] 78 (04/01 0600) Resp:  [13-18] 16 (04/01 0600) BP: (136-166)/(52-67) 147/67 mmHg (04/01 0600) SpO2:  [95 %-100 %] 95 % (04/01 0600) Weight:  [73.029 kg (161 lb)] 73.029 kg (161 lb) (03/31 1049)  Intake/Output from previous day: 03/31 0701 - 04/01 0700 In: 3818 [P.O.:120; I.V.:1500] Out: 1320 [Urine:750; Drains:320; Blood:250]  Labs:  Recent Labs  06/10/14 1031  WBC 5.6  RBC 4.54  HCT 43.1  PLT 198    Recent Labs  06/10/14 1031  NA 140  K 4.2  CL 105  CO2 24  BUN 27*  CREATININE 1.02  GLUCOSE 96  CALCIUM 9.9   No results for input(s): LABPT, INR in the last 72 hours.  Physical Exam: Neurologically intact ABD soft Intact pulses distally Incision: dressing C/D/I Compartment soft no significant headache Drain: serous output. No clear fluid - 320 since surgery.  Assessment/Plan: Patient stable  xrays n/a Continue mobilization with physical therapy Continue care  Patient stable - no focal neuro deficits.  Neuropathic leg pain improving At noon will incrementally elevate HOB to make sure no further CSF leak Continue drain given increased output Will continue IV ancef since drain will remain in place Nausea improved with phenergan   Melina Schools, MD [place] [phone]

## 2014-06-11 NOTE — Progress Notes (Signed)
Summary: Initiated @ 1200 per order - advanced HOB to flat with increasing degrees Q 30 minutes to 90 degrees x 30 minutes w/o headache or complaints. Therapy informed activity restrictions are now d/c'ed - PT went to patient room to work with her for 1st post-op therapy visit - see therapy notes.

## 2014-06-11 NOTE — Evaluation (Signed)
Physical Therapy Evaluation Patient Details Name: April Hodges MRN: [id number] DOB: [birth year] Today's Date: 06/11/2014   History of Present Illness  81 y.o. female admitted to [facility] 06/10/14 for elective lumbar decompression and fusion I9-4 with complication of CSF leak.  Pt on bedrest until noon on 06/11/14 and then allowed to mobilize OOB with therapy.  Pt with significant PMHx of HTN, sinus brady, and Bil TKA.  Clinical Impression  Pt is mobilizing well min assist with RW, short in-room distances.  She is appropriate for SNF at [facility] home before returning [facility] ([facility]).  PT to follow acutely for deficits listed below.       Follow Up Recommendations SNF    Equipment Recommendations  None recommended by PT    Recommendations for Other Services   NA    Precautions / Restrictions Precautions Precautions: Back Precaution Comments: reviewed back precautions and brace use Required Braces or Orthoses: Other Brace/Splint;Spinal Brace Spinal Brace: Lumbar corset;Applied in sitting position Other Brace/Splint: has a JP drain      Mobility  Bed Mobility Overal bed mobility: Needs Assistance Bed Mobility: Rolling;Sidelying to Sit Rolling: Min assist Sidelying to sit: Min assist       General bed mobility comments: Min assist to roll with verbal cues for log roll technique.  Min assist to support trunk to get from side lying to sitting EOB with bed rail for support.    Transfers Overall transfer level: Needs assistance Equipment used: Rolling walker (2 wheeled) Transfers: Sit to/from Stand Sit to Stand: Min assist         General transfer comment: Min assist to support trunk during transition to stand.   Ambulation/Gait Ambulation/Gait assistance: Min assist Ambulation Distance (Feet): 20 Feet Assistive device: Rolling walker (2 wheeled) Gait Pattern/deviations: Step-through pattern;Shuffle Gait velocity:  decreased   General Gait Details: Verbal cues for proximity to RW and upright posture.  Min assist to steady pt for balance during gait.          Balance Overall balance assessment: Needs assistance Sitting-balance support: Feet supported;Bilateral upper extremity supported Sitting balance-Leahy Scale: Fair     Standing balance support: Bilateral upper extremity supported Standing balance-Leahy Scale: Poor                               Pertinent Vitals/Pain Pain Assessment: 0-10 Pain Score: 5  Pain Location: buttocks and lower back Pain Descriptors / Indicators: Aching;Burning;Numbness Pain Intervention(s): Limited activity within patient's tolerance;Monitored during session;Repositioned    Home Living Family/patient expects to be discharged to:: Skilled nursing facility (lives at [place] in a house.  Plan for SNF for 1-2 weeks) Living Arrangements: Alone Available Help at Discharge: Family;Available 24 hours/day (daughter staying for a few weeks) Type of Home: House Home Access: Level entry     Home Layout: One level Home Equipment: Walker - 2 wheels;Cane - single point;Shower seat - built in;Grab bars - tub/shower;Hand held shower head Additional Comments: Has a bed rail.  Uses a golf cart to get around campus.     Prior Function Level of Independence: Independent with assistive device(s)         Comments: [facility].  Does use a cane at times.  Likes to walk her dog.      Hand Dominance   Dominant Hand: Right    Extremity/Trunk Assessment   Upper Extremity Assessment: Defer to  OT evaluation           Lower Extremity Assessment: Generalized weakness         Communication   Communication: No difficulties  Cognition Arousal/Alertness: Awake/alert Behavior During Therapy: WFL for tasks assessed/performed Overall Cognitive Status: Within Functional Limits for tasks assessed                                Assessment/Plan    PT Assessment Patient needs continued PT services  PT Diagnosis Difficulty walking;Abnormality of gait;Generalized weakness;Acute pain   PT Problem List Decreased strength;Decreased activity tolerance;Decreased balance;Decreased mobility;Decreased knowledge of use of DME;Decreased knowledge of precautions;Pain  PT Treatment Interventions DME instruction;Gait training;Functional mobility training;Therapeutic activities;Therapeutic exercise;Balance training;Neuromuscular re-education;Patient/family education;Modalities   PT Goals (Current goals can be found in the Care Plan section) Acute Rehab PT Goals Patient Stated Goal: to go home, get back to her PLOF PT Goal Formulation: With patient/family Time For Goal Achievement: 06/18/14 Potential to Achieve Goals: Good    Frequency Min 5X/week    End of Session Equipment Utilized During Treatment: Back brace Activity Tolerance: Patient limited by pain Patient left: in chair;with call bell/phone within reach;with family/visitor present Nurse Communication: Mobility status         Time: 8381-8403 PT Time Calculation (min) (ACUTE ONLY): 26 min   Charges:   PT Evaluation $Initial PT Evaluation Tier I: 1 Procedure PT Treatments $Therapeutic Activity: 8-22 mins        Lauralei Clouse B. [place], [place], DPT [phone]   06/11/2014, 4:50 PM

## 2014-06-11 NOTE — Progress Notes (Signed)
PT Cancellation Note  Patient Details Name: April Hodges MRN: [id number] DOB: [birth year]   Cancelled Treatment:    Reason Eval/Treat Not Completed: Patient not medically ready.  Per MD note, bed rest until noon then incremental increase of HOB.  PT will check back with RN late PM to see if we can evaluate/mobilize today.  Thanks,    Barbarann Ehlers. [place], [place], DPT [phone]   06/11/2014, 10:54 AM

## 2014-06-11 NOTE — Op Note (Signed)
April, Hodges NO.:  [id number]  MEDICAL RECORD NO.:  [id number]  LOCATION:  [id number]                        FACILITY:  [facility]  PHYSICIAN:  Dahlia Bailiff, MD    DATE OF BIRTH:  [birth year]  DATE OF PROCEDURE:  06/10/2014 DATE OF DISCHARGE:                              OPERATIVE REPORT   PREOPERATIVE DIAGNOSIS:  Severe spinal stenosis with spondylolisthesis L4-5 and L3-4.  POSTOPERATIVE DIAGNOSIS:  Severe spinal stenosis with spondylolisthesis L4-5 and L3-4.  OPERATIVE PROCEDURES: 1. Gill decompression, L4-5, with central and lateral recess     decompression, L3-4. 2. In situ arthrodesis, L4-5.  COMPLICATIONS:  CSF leak, managed with attempted suture repair, ultimately used a DuraGen patch with DuraSeal.  Following this use, I then did a Valsalva to 40 mmHg for 10 seconds.  There was no active CSF leak.  INTRAOPERATIVE FINDINGS:  Severe thecal sac compression at L4-5 with significant adhesive, inflammatory tissue with significant compression of the L5 nerve root.  There was also severe spinal stenosis at the L3-4 level with marked compression of the L4 nerve root and lateral recess. Significant facet arthrosis.  CLINICAL HISTORY:  This is a very pleasant 81 year old woman who has been having severe debilitating back, buttock, bilateral leg pain, left side worse than the right.  The patient has been treated conservatively and despite appropriate management, has had continued severe debilitating pain.  As a result of the severe stenosis and her neurogenic claudication, we elected to proceed with surgery.  All appropriate risks, benefits, and alternatives to surgery were discussed with the patient and consent was obtained.  DESCRIPTION OF PROCEDURE:  The patient was brought to the operating room, placed supine on the operating table.  After successful induction of general anesthesia and endotracheal intubation, TEDs, SCDs, and Foley were inserted.   The patient was turned prone onto the Wilson frame.  All bony prominences were well padded and the back was prepped and draped in a standard fashion.  Time-out was taken to confirm the patient, procedure, and all other pertinent important data.  Once that was completed, fluoro machine was used to identify the L4-5 level.  Once this was identified, I marked out the incision site and infiltrated with 0.25% Marcaine with epinephrine.  A midline incision was made from the superior aspect of the L3 spinous process down to the inferior aspect of the L5 spinous process.  Sharp dissection was carried out down to the deep fascia.  Deep fascia was sharply incised and using Bovie, I stripped the paraspinal muscles to expose the L3, L4, and L5 spinous process.  There was significant loss of the L4-5 space with marked facet arthrosis.  Once I had the exposure, I then rechecked the levels to ensure I was at the L4-5 level.  I then used a double-action Leksell rongeur to remove the spinous process of L4 and L3 in their entirety.  I then took a fine nerve curette and gently developed the plane between the ligamentum flavum and the L4 lamina.  This was very difficult given the fact that there was marked compression.  Once I was able to get underneath the L4 lamina, I  used my 2 and 3 mm Kerrison to ultimately perform a complete laminectomy of L4.  This was very difficult given how adherent the thecal sac was.  Great care was taken during the dissection using [place] 4 and [facility] elevator to create a safe plane between the ligamentum flavum and the dura to be resected with Kerrison rongeur. Once I had a good central decompression at L4-5, I proceeded into the lateral recess.  I clearly identified the L5 nerve root which was markedly compressed.  On both sides, there was an indentation and petechial changes noted on the nerve root.  I gently used my Kerrison rongeur to perform a foraminotomy to decompress  the nerve.  This was done bilaterally.  Once this was completed, I then went out to the L3 level, developed a similar plane using a nerve curette and then used my 2 and 3 mm Kerrison to perform a central laminotomy.  I then dissected out laterally.  Once I had most of the bony dissection completed, I then developed a plane in the ligamentum flavum and resected the ligamentum flavum to expose the thecal sac.  I continued into the lateral recess. Once I was down into the lateral recess, I was able to palpate the L3 pedicle.  I then identified the L4 nerve root in the lateral recess and decompressed it using a 2 and 3 mm Kerrison rongeur.  At this point, I now had just the remaining portion of the L4 lamina centrally which was significantly adherent and scarred to the thecal sac.  Using great care, I eventually developed a plane and was able to complete the L4 laminectomy.  I then went towards the left-hand gutter.  Again, there was a thickened ligamentum flavum that had calcified and had become very adherent to the underlying dura.  As I was developing a plane for removal of this, there was noted to be a CSF leak.  I then placed a Neuro patty over the area that was leaking and continued to decompression.  Once I had this decompression, I could now visualize the entire L5 nerve root in the lateral recess and into its foramen, and I could visualize the L4 lamina.  I did a very aggressive foraminotomy in order to create adequate space.  The inferior L4 facet ultimately was removed from the left side to create adequate room.  On the right-hand side, there was a significant amount of neuro compression, but it was not as severe as the left.  Once I had the decompression complete, I could use my [facility] now, freely passed it along the L5 nerve root into the foramen in the lateral recess at L4-5 out the L4 foramen and superiorly towards the L3 pedicle.  On the left-hand side, I was able to  move the [facility], signifying I had an adequate decompression.  At this point, I went back to the area on the left L4-5 level where there was a leak.  I attempted to identify the hole, but it was more of an irritated and inflamed area that was leaking.  I attempted to place a stitch, but this was not successful in controlling the leak.  At this point, I placed a DuraGen patch over the area where it was leaking and then placed DuraSeal to form a watertight closure. Once this was allowed to process, I then did a Valsalva to 40.  At this point, there was no further leak of spinal fluid that I could identify. With the  decompression complete, I then used a high-speed bur to decorticate the transverse process of L5 and L4 bilaterally and then packed the posterolateral gutter with bone graft that I had harvested from the decompression.  I then placed a drain and irrigated copiously with normal saline.  I made sure I had hemostasis using bipolar electrocautery.  The drain was stitched and prevented from losing and then I closed the deep fascia with interrupted #1 Vicryl sutures, superficial with 2-0 Vicryl sutures, and 3-0 Monocryl for the skin. Steri-Strips and a dry dressing were applied.  The patient was ultimately extubated, transferred to the PACU, and left in a Trendelenburg position.  At the end of the case, all needle and sponge counts were correct.     Dahlia Bailiff, MD     DDB/MEDQ  D:  06/10/2014  T:  06/11/2014  Job:  [id number]

## 2014-06-11 NOTE — Progress Notes (Signed)
Utilization review completed. Fritz Cauthon, RN, BSN. 

## 2014-06-11 NOTE — Progress Notes (Signed)
OT Cancellation Note  Patient Details Name: April Hodges MRN: [id number] DOB: 03-Dec-1925   Cancelled Treatment:    Reason Eval/Treat Not Completed: Medical issues which prohibited therapy (BR until noon ) Pt with gradual HOB increase. OT to check back as time allows and appropriate .   Peri Maris  Pager: [phone]  06/11/2014, 7:47 AM

## 2014-06-11 NOTE — Anesthesia Postprocedure Evaluation (Signed)
Anesthesia Post Note  Patient: April Hodges  Procedure(s) Performed: Procedure(s) (LRB): LUMBAR DECOMPRESSION L3 - L5 2 LEVELS (N/A)  Anesthesia type: general  Patient location: PACU  Post pain: Pain level controlled  Post assessment: Patient's Cardiovascular Status Stable  Last Vitals:  Filed Vitals:   06/11/14 0600  BP: 147/67  Pulse: 78  Temp: 36.7 C  Resp: 16    Post vital signs: Reviewed and stable  Level of consciousness: sedated  Complications: No apparent anesthesia complications

## 2014-06-12 MED ORDER — TRAMADOL HCL 50 MG PO TABS
50.0000 mg | ORAL_TABLET | Freq: Four times a day (QID) | ORAL | Status: DC | PRN
  Administered 2014-06-12 (×2): 50 mg via ORAL
  Filled 2014-06-12 (×2): qty 1

## 2014-06-12 MED ORDER — ACETAMINOPHEN 325 MG PO TABS
650.0000 mg | ORAL_TABLET | Freq: Four times a day (QID) | ORAL | Status: DC | PRN
  Administered 2014-06-12 – 2014-06-14 (×4): 650 mg via ORAL
  Filled 2014-06-12 (×4): qty 2

## 2014-06-12 MED ORDER — DEXAMETHASONE 4 MG PO TABS
4.0000 mg | ORAL_TABLET | Freq: Four times a day (QID) | ORAL | Status: DC
  Administered 2014-06-12 – 2014-06-14 (×8): 4 mg via ORAL
  Filled 2014-06-12 (×6): qty 1

## 2014-06-12 MED ORDER — DEXAMETHASONE SODIUM PHOSPHATE 4 MG/ML IJ SOLN
4.0000 mg | Freq: Four times a day (QID) | INTRAMUSCULAR | Status: DC
  Filled 2014-06-12: qty 1

## 2014-06-12 MED ORDER — MAGNESIUM CITRATE PO SOLN
0.5000 | Freq: Once | ORAL | Status: AC
  Administered 2014-06-12: 0.5 via ORAL
  Filled 2014-06-12: qty 296

## 2014-06-12 MED ORDER — SODIUM CHLORIDE 0.9 % IJ SOLN
3.0000 mL | Freq: Two times a day (BID) | INTRAMUSCULAR | Status: DC
  Administered 2014-06-12 – 2014-06-13 (×3): 3 mL via INTRAVENOUS

## 2014-06-12 NOTE — Progress Notes (Signed)
Physical Therapy Treatment Patient Details Name: April Hodges MRN: [id number] DOB: 1926-01-31 Today's Date: 2014/07/09    History of Present Illness 81 y.o. female admitted to [facility] 06/10/14 for elective lumbar decompression and fusion M0-8 with complication of CSF leak.  Pt on bedrest until noon on 06/11/14 and then allowed to mobilize OOB with therapy.  Pt with significant PMHx of HTN, sinus brady, and Bil TKA.    PT Comments    Pt making steady progress toward her goals. Today's session focused on education as pt has been up all day. Reports she walking in hallway with her son this am and has been to/from bathroom several times today, at this time want to rest in bed.   Follow Up Recommendations  SNF     Equipment Recommendations  None recommended by PT       Precautions / Restrictions Precautions Precautions: Back;Fall Precaution Comments: reviewed back precautions and brace use    Self Care: Handout on back precautions and general body mechanics/positioning given to pt and daughter. Educated on these positions and general do's/don'ts with mobility. Pt has a cat at home that she has to keep food bowl separate and away from her dog. Discussed elevating this food on surface high enough dog can not get too it and cat can. This will also keep her from having to squat down. Daughter plans to handle the litter box for now. Also provided educated on how to get in/out of car when pt asked. Pt deferred any mobility as she has just got into bed after being up all day and just received medicine to stimulate her to have a BM.     Cognition Arousal/Alertness: Awake/alert Behavior During Therapy: WFL for tasks assessed/performed Overall Cognitive Status: Within Functional Limits for tasks assessed          Pertinent Vitals/Pain Pain Assessment: 0-10 Pain Score: 4  Pain Location: back Pain Descriptors / Indicators: Sore;Aching Pain Intervention(s): Monitored during session;Limited activity  within patient's tolerance     PT Goals (current goals can now be found in the care plan section) Acute Rehab PT Goals Patient Stated Goal: to go home, get back to her PLOF PT Goal Formulation: With patient/family Time For Goal Achievement: 06/18/14 Potential to Achieve Goals: Good Progress towards PT goals: Progressing toward goals    Frequency  Min 5X/week    PT Plan Current plan remains appropriate       End of Session   Activity Tolerance: Patient limited by pain;Patient limited by fatigue Patient left: in bed;with family/visitor present;with call bell/phone within reach     Time: 1435-1449 PT Time Calculation (min) (ACUTE ONLY): 14 min  Charges:  $Self Care/Home Management: November 28, 2022                    G Codes:      Willow Ora Jul 09, 2014, 2:52 PM  Willow Ora, PTA, [place] [address], [place], [place] [address] [phone] 09-Jul-2014, 2:56 PM

## 2014-06-12 NOTE — Progress Notes (Addendum)
Subjective: 2 Days Post-Op Procedure(s) (LRB): LUMBAR DECOMPRESSION L3 - L5 2 LEVELS (N/A) Patient reports pain as moderate.  Legs already feeling much better than pre-op. Noting some incisional back soreness. She would like to try ultram and tylenol prn for pain. Voiding without difficulty, positive flatus, no BM yet.  Objective: Vital signs in last 24 hours: Temp:  [97.8 F (36.6 C)-98.2 F (36.8 C)] 97.8 F (36.6 C) (04/02 0619) Pulse Rate:  [64-79] 64 (04/02 0619) Resp:  [18] 18 (04/01 1320) BP: (124-146)/(42-61) 146/61 mmHg (04/02 0619) SpO2:  [93 %-95 %] 93 % (04/02 0619)  Intake/Output from previous day: 04/01 0701 - 04/02 0700 In: 600 [P.O.:600] Out: 1380 [Urine:1200; Drains:180] Intake/Output this shift:     Recent Labs  06/10/14 1031 06/11/14 0524  HGB 14.2 12.7    Recent Labs  06/10/14 1031 06/11/14 0524  WBC 5.6 8.0  RBC 4.54 4.15  HCT 43.1 39.2  PLT 198 183    Recent Labs  06/10/14 1031  NA 140  K 4.2  CL 105  CO2 24  BUN 27*  CREATININE 1.02  GLUCOSE 96  CALCIUM 9.9   No results for input(s): LABPT, INR in the last 72 hours.  Neurologically intact ABD soft Neurovascular intact Sensation intact distally Intact pulses distally Dorsiflexion/Plantar flexion intact Incision: dressing C/D/I and no drainage No cellulitis present Compartments soft No calf pain or sign of DVT EHL/DF/PF strength 5/5 B/L Drain D/C'd tip intact  Assessment/Plan: 2 Days Post-Op Procedure(s) (LRB): LUMBAR DECOMPRESSION L3 - L5 2 LEVELS (N/A) Advance diet Up with therapy D/C IV fluids  Drain removed today, new dressing placed Discussed with Dr. Rolena Infante who also saw pt this AM Will add 1/2 bottle mag citrate for constipation now Will use ultram and tylenol prn pain, D/C'd OxyIR Plan D/C Monday to SNF  Jude Linck M. 06/12/2014, 9:47 AM

## 2014-06-12 NOTE — Progress Notes (Signed)
Clinical Social Work Department CLINICAL SOCIAL WORK PLACEMENT NOTE 06/12/2014  Patient:  April Hodges, April Hodges  Account Number:  [id number] Admit date:  06/10/2014  Clinical Social Worker:  Crawford Givens, LCSW  Date/time:  06/12/2014 04:35 PM  Clinical Social Work is seeking post-discharge placement for this patient at the following level of care:   SKILLED NURSING   (*CSW will update this form in Epic as items are completed)   06/12/2014  Patient/family provided with [place] Department of Clinical Social Work's list of facilities offering this level of care within the geographic area requested by the patient (or if unable, by the patient's family).  06/12/2014  Patient/family informed of their freedom to choose among providers that offer the needed level of care, that participate in Medicare, Medicaid or managed care program needed by the patient, have an available bed and are willing to accept the patient.  06/12/2014  Patient/family informed of [facility]' ownership interest in [facility], as well as of the fact that they are under no obligation to receive care at this facility.  PASARR submitted to EDS on  PASARR number received on   FL2 transmitted to all facilities in geographic area requested by pt/family on   FL2 transmitted to all facilities within larger geographic area on   Patient informed that his/her managed care company has contracts with or will negotiate with  certain facilities, including the following:     Patient/family informed of bed offers received:   Patient chooses bed at  Physician recommends and patient chooses bed at    Patient to be transferred to  on   Patient to be transferred to facility by  Patient and family notified of transfer on  Name of family member notified:    The following physician request were entered in Epic:   Additional Comments:   [place]

## 2014-06-12 NOTE — Progress Notes (Signed)
Clinical Social Work Department BRIEF PSYCHOSOCIAL ASSESSMENT 06/12/2014  Patient:  April Hodges, April Hodges     Account Number:  [id number]     Admit date:  06/10/2014  Clinical Social Worker:  Frederico Hamman  Date/Time:  06/12/2014 04:24 PM  Referred by:  Physician  Date Referred:  06/12/2014 Referred for  SNF Placement   Other Referral:   Interview type:  Patient Other interview type:   Pt's daughter Shaune Pascal [phone]) was also present at the bedside.    PSYCHOSOCIAL DATA Living Status:  FACILITY Admitted from facility:  [facility] Level of care:  Independent Living Primary support name:  Shaune Pascal [phone] Primary support relationship to patient:  CHILD, ADULT Degree of support available:   Pt has a good support system as Pt lives at [facility] in the independent living as will be going to the SNF at D/C.    CURRENT CONCERNS Current Concerns  Post-Acute Placement   Other Concerns:    SOCIAL WORK ASSESSMENT / PLAN CSW met with the Pt and daughter at the bedside to discuss d/c planing to SNF. CSW introduced self and reason for assessment. CSW explained d/c planning process. Pt was aware of the placement options and stated that she "lives at [facility] and would like to go to their SNF section for rehab." Pt stated that the facility is aware of her wishes and are holding a bed for her at the time of d/c.    Pt stated that she received excellant care from Dr. Rolena Infante and feels much better than before." Pt looking forward to d/c.   Assessment/plan status:  Information/Referral to [employer] Other assessment/ plan:   Information/referral to community resources:   No resources were needed at this time.    PATIENT'S/FAMILY'S RESPONSE TO PLAN OF CARE: Pt and daughter were appreciative for assistance and assessment. Weekday to follow up for d/c planning to [facility] at time of d/c.        [place]

## 2014-06-12 NOTE — Care Management Note (Signed)
CARE MANAGEMENT NOTE 06/12/2014  Patient:  April Hodges, April Hodges   Account Number:  [id number]  Date Initiated:  06/12/2014  Documentation initiated by:  Oliveras-Aizpurua,Kaelum Kissick  Subjective/Objective Assessment:   81 yo F, s/p lumbar decompression and fusion K1-5 with complication of CSF leak     Action/Plan:   PT is recommending SNF   Anticipated DC Date:  06/14/2014   Anticipated DC Plan:  SKILLED NURSING FACILITY  In-house referral  Clinical Social Worker      DC Planning Services  CM consult      Choice offered to / List presented to:             Status of service:  In process, will continue to follow Medicare Important Message given?   (If response is "NO", the following Medicare IM given date fields will be blank) Date Medicare IM given:   Medicare IM given by:   Date Additional Medicare IM given:  06/12/2014 Additional Medicare IM given by:  Norina Buzzard  Discharge Disposition:  [facility]  Per UR Regulation:    If discussed at Long Length of Stay Meetings, dates discussed:    Comments:  06/12/14 1600 - Frann Rider, RN, BSN  Met with pt and daughter. Pt resides at [facility]. She plans to go to the care center at the community center. She already talked to the SW.

## 2014-06-12 NOTE — Evaluation (Signed)
Occupational Therapy Evaluation Patient Details Name: April Hodges MRN: [id number] DOB: 1925/09/10 Today's Date: 06/12/2014    History of Present Illness 81 y.o. female admitted to [facility] 06/10/14 for elective lumbar decompression and fusion X4-4 with complication of CSF leak.  Pt on bedrest until noon on 06/11/14 and then allowed to mobilize OOB with therapy.  Pt with significant PMHx of HTN, sinus brady, and Bil TKA.   Clinical Impression   Pt was performing ADL at a modified independent level prior to admission.  Pt presents pain, generalized weakness and impaired balance interfering with ability to perform ADL and ADL transfers.  Plan is for post acute rehab in SNF. Will defer further OT to SNF.    Follow Up Recommendations  SNF;Supervision/Assistance - 24 hour    Equipment Recommendations       Recommendations for Other Services       Precautions / Restrictions Precautions Precautions: Back;Fall Precaution Booklet Issued: Yes (comment) Precaution Comments: reviewed back precautions and brace use Required Braces or Orthoses: Other Brace/Splint;Spinal Brace Spinal Brace: Lumbar corset;Applied in sitting position Other Brace/Splint: has a JP drain      Mobility Bed Mobility                  Transfers Overall transfer level: Needs assistance Equipment used: Rolling walker (2 wheeled) Transfers: Sit to/from Stand Sit to Stand: Min guard         General transfer comment: no physical assist, verbal cues for hand placement, extra time    Balance                                            ADL Overall ADL's : Needs assistance/impaired Eating/Feeding: Independent;Sitting   Grooming: Wash/dry hands;Min guard;Standing   Upper Body Bathing: Minimal assitance;Sitting Upper Body Bathing Details (indicate cue type and reason): assist for back Lower Body Bathing: Minimal assistance;Sit to/from stand Lower Body Bathing Details (indicate cue type and  reason): recommended long handled sponge Upper Body Dressing : Minimal assistance;Maximal assistance;Standing Upper Body Dressing Details (indicate cue type and reason): mod assist for back brace Lower Body Dressing: Moderate assistance;Sit to/from stand Lower Body Dressing Details (indicate cue type and reason): educated in use of reacher  Toilet Transfer: Min guard;Ambulation;RW   Toileting- Clothing Manipulation and Hygiene: Minimal assistance;Sit to/from stand       Functional mobility during ADLs: Min guard;Rolling walker General ADL Comments: Educated in transporting items with RW, technique for feeding her dog, will need assist for laundry, changing sheets on bed, taking out garbage and performing heaving cleaning.  Pt has resources at [employer] to assist.     Vision     Perception     Praxis      Pertinent Vitals/Pain Pain Assessment: 0-10 Pain Score: 5  Pain Location: back Pain Descriptors / Indicators: Grimacing;Operative site guarding Pain Intervention(s): Limited activity within patient's tolerance;Monitored during session;Repositioned     Hand Dominance Right   Extremity/Trunk Assessment Upper Extremity Assessment Upper Extremity Assessment: Overall WFL for tasks assessed (arthritic changes in hands)   Lower Extremity Assessment Lower Extremity Assessment: Defer to PT evaluation       Communication Communication Communication: No difficulties   Cognition Arousal/Alertness: Awake/alert Behavior During Therapy: WFL for tasks assessed/performed Overall Cognitive Status: Within Functional Limits for tasks assessed  General Comments       Exercises       Shoulder Instructions      Home Living Family/patient expects to be discharged to:: Skilled nursing facility [facility] ) Living Arrangements: Alone Available Help at Discharge: Family;Available 24 hours/day Type of Home: House Home Access: Level entry     Home  Layout: One level               Home Equipment: Walker - 2 wheels;Cane - single point;Shower seat - built in;Grab bars - tub/shower;Hand held shower head;Adaptive equipment Adaptive Equipment: Reacher;Sock aid Additional Comments: Has a bed rail.  Uses a golf cart to get around campus.       Prior Functioning/Environment Level of Independence: Independent with assistive device(s)        Comments: does not plan to wear socks, plans to use slip on shoes    OT Diagnosis: Generalized weakness;Acute pain   OT Problem List:     OT Treatment/Interventions:      OT Goals(Current goals can be found in the care plan section) Acute Rehab OT Goals Patient Stated Goal: to go home, get back to her PLOF  OT Frequency:     Barriers to D/C:            Co-evaluation              End of Session    Activity Tolerance: Patient tolerated treatment well Patient left: in chair;with call bell/phone within reach;with family/visitor present   Time: 0017-4944 OT Time Calculation (min): 27 min Charges:  OT General Charges $OT Visit: 1 Procedure OT Evaluation $Initial OT Evaluation Tier I: 1 Procedure OT Treatments $Self Care/Home Management : 8-22 mins G-Codes:    Malka So 06/12/2014, 9:21 AM  [phone]

## 2014-06-13 NOTE — Progress Notes (Signed)
   Subjective: 3 Days Post-Op Procedure(s) (LRB): LUMBAR DECOMPRESSION L3 - L5 2 LEVELS (N/A) Patient reports pain as mild.   Patient seen in rounds for Dr. Rolena Infante. Patient is well, and has had no acute complaints or problems. She reports that she slept very well last night. No SOB or chest pain. She feels that she is improving and getting stronger. Reports desire to get to [facility] tomorrow. Voiding well. No BM but positive flatus.    Objective: Vital signs in last 24 hours: Temp:  [97.9 F (36.6 C)-98.5 F (36.9 C)] 97.9 F (36.6 C) (04/02 2020) Pulse Rate:  [62-66] 62 (04/02 2020) Resp:  [18] 18 (04/02 2020) BP: (151)/(59) 151/59 mmHg (04/02 2020) SpO2:  [92 %-97 %] 92 % (04/02 2020)  Intake/Output from previous day:  Intake/Output Summary (Last 24 hours) at 06/13/14 0656 Last data filed at 06/12/14 1300  Gross per 24 hour  Intake    600 ml  Output      0 ml  Net    600 ml     Labs:  Recent Labs  06/10/14 1031 06/11/14 0524  HGB 14.2 12.7    Recent Labs  06/10/14 1031 06/11/14 0524  WBC 5.6 8.0  RBC 4.54 4.15  HCT 43.1 39.2  PLT 198 183    Recent Labs  06/10/14 1031  NA 140  K 4.2  CL 105  CO2 24  BUN 27*  CREATININE 1.02  GLUCOSE 96  CALCIUM 9.9   EXAM General - Patient is Alert and Oriented Extremity - Neurologically intact Neurovascular intact Sensation intact distally Intact pulses distally Dorsiflexion/Plantar flexion intact Dressing/Incision - clean, dry, no drainage   Past Medical History  Diagnosis Date  . Hypothyroidism   . Hypertension   . GERD (gastroesophageal reflux disease)   . Arthritis   . Sinus bradycardia     states rates in 40's and low 50's are normal for her  . Pneumonia     as a child  . Headache     in younger years  . Cancer     skin cancer  . PONV (postoperative nausea and vomiting)     Assessment/Plan: 3 Days Post-Op Procedure(s) (LRB): LUMBAR DECOMPRESSION L3 - L5 2 LEVELS (N/A) Active  Problems:   Back pain  Estimated body mass index is 30.44 kg/(m^2) as calculated from the following:   Height as of this encounter: 5\' 1"  (1.549 m).   Weight as of this encounter: 73.029 kg (161 lb). Advance diet Up with therapy  Will continue PT today. Plan for DC to [facility] tomorrow pending continued improvement.   Ardeen Jourdain, PA-C Orthopaedic Surgery 06/13/2014, 6:56 AM

## 2014-06-14 ENCOUNTER — Encounter (HOSPITAL_COMMUNITY): Payer: Self-pay | Admitting: Orthopedic Surgery

## 2014-06-14 MED ORDER — HYDROCODONE-ACETAMINOPHEN 5-325 MG PO TABS: 1.0000 | ORAL_TABLET | ORAL | Status: DC | PRN

## 2014-06-14 MED ORDER — ONDANSETRON HCL 4 MG PO TABS: 4.0000 mg | ORAL_TABLET | Freq: Three times a day (TID) | ORAL | Status: DC | PRN

## 2014-06-14 MED ORDER — METHOCARBAMOL 500 MG PO TABS: 500.0000 mg | ORAL_TABLET | Freq: Three times a day (TID) | ORAL | Status: DC | PRN

## 2014-06-14 NOTE — Progress Notes (Signed)
    Subjective: Procedure(s) (LRB): LUMBAR DECOMPRESSION L3 - L5 2 LEVELS (N/A) 4 Days Post-Op  Patient reports pain as 1 on 0-10 scale.  Reports decreased leg pain reports incisional back pain   Positive void Positive bowel movement Positive flatus Negative chest pain or shortness of breath  Objective: Vital signs in last 24 hours: Temp:  [98.1 F (36.7 C)-98.2 F (36.8 C)] 98.1 F (36.7 C) (04/04 0554) Pulse Rate:  [56-58] 56 (04/04 0554) Resp:  [16] 16 (04/04 0554) BP: (157-168)/(58-61) 168/61 mmHg (04/04 1053) SpO2:  [93 %-94 %] 94 % (04/04 0554)  Intake/Output from previous day: 04/03 0701 - 04/04 0700 In: 840 [P.O.:840] Out: -   Labs: No results for input(s): WBC, RBC, HCT, PLT in the last 72 hours. No results for input(s): NA, K, CL, CO2, BUN, CREATININE, GLUCOSE, CALCIUM in the last 72 hours. No results for input(s): LABPT, INR in the last 72 hours.  Physical Exam: Neurologically intact ABD soft Dorsiflexion/Plantar flexion intact Incision: dressing C/D/I Compartment soft  Assessment/Plan: Patient stable  xrays n/a Continue mobilization with physical therapy Continue care  Discharge to SNF  Melina Schools, MD [place] [phone]

## 2014-06-14 NOTE — Discharge Instructions (Signed)
Ok to shower in 3 days Keep wound clean and dry Ambulate with brace

## 2014-06-14 NOTE — Clinical Social Work Note (Signed)
Patient is set up with [facility] SNF at time of discharge.  Bed is available.  Patient will be transported via family at time of discharge.    Discharge: pending MD/PA completion of dc summary and dc order placed  Family and patient aware/updated.  Nonnie Done, [place] [phone]  Psychiatric & Orthopedics (5N 1-8) Clinical Social Worker

## 2014-06-14 NOTE — Care Management Note (Signed)
CARE MANAGEMENT NOTE 06/14/2014  Patient:  April Hodges, April Hodges   Account Number:  [id number]  Date Initiated:  06/12/2014  Documentation initiated by:  Oliveras-Aizpurua,Jeannette  Subjective/Objective Assessment:   81 yo F, s/p lumbar decompression and fusion A1-9 with complication of CSF leak     Action/Plan:   PT is recommending SNF   Anticipated DC Date:  06/14/2014   Anticipated DC Plan:  SKILLED NURSING FACILITY  In-house referral  Clinical Social Worker      DC Planning Services  CM consult      Choice offered to / List presented to:             Status of service:  Completed, signed off Medicare Important Message given?   (If response is "NO", the following Medicare IM given date fields will be blank) Date Medicare IM given:   Medicare IM given by:   Date Additional Medicare IM given:  06/12/2014 Additional Medicare IM given by:  Norina Buzzard  Discharge Disposition:  [place]  Per UR Regulation:  Reviewed for med. necessity/level of care/duration of stay  If discussed at [place] of Stay Meetings, dates discussed:    Comments:  06/12/14 1600 - Frann Rider, RN, BSN  Met with pt and daughter. Pt resides at [facility]. She plans to [facility] the [facility]. She already talked to the SW.

## 2014-06-14 NOTE — Discharge Summary (Signed)
Patient ID: April Hodges MRN: [id number] DOB/AGE: 04-29-1925 81 y.o.  Admit date: 06/10/2014 Discharge date: 06/14/2014  Admission Diagnoses:  Active Problems:   Back pain   Discharge Diagnoses:  Active Problems:   Back pain  status post Procedure(s): LUMBAR DECOMPRESSION L3 - L5 2 LEVELS  Past Medical History  Diagnosis Date  . Hypothyroidism   . Hypertension   . GERD (gastroesophageal reflux disease)   . Arthritis   . Sinus bradycardia     states rates in 40's and low 50's are normal for her  . Pneumonia     as a child  . Headache     in younger years  . Cancer     skin cancer  . PONV (postoperative nausea and vomiting)     Surgeries: Procedure(s): LUMBAR DECOMPRESSION L3 - L5 2 LEVELS on 06/10/2014   Consultants:    Discharged Condition: Improved  Hospital Course: April Hodges is an 81 y.o. female who was admitted 06/10/2014 for operative treatment of stenosis. Patient failed conservative treatments (please see the history and physical for the specifics) and had severe unremitting pain that affects sleep, daily activities and work/hobbies. After pre-op clearance, the patient was taken to the operating room on 06/10/2014 and underwent  Procedure(s): LUMBAR DECOMPRESSION L3 - L5 2 LEVELS.    Patient was given perioperative antibiotics: Anti-infectives    Start     Dose/Rate Route Frequency Ordered Stop   06/12/14 0200  ceFAZolin (ANCEF) IVPB 1 g/50 mL premix     1 g 100 mL/hr over 30 Minutes Intravenous Every 8 hours 06/11/14 2001 06/12/14 1109   06/11/14 1100  ceFAZolin (ANCEF) IVPB 1 g/50 mL premix  Status:  Discontinued     1 g 100 mL/hr over 30 Minutes Intravenous 3 times per day 06/11/14 0706 06/11/14 2001   06/10/14 1930  ceFAZolin (ANCEF) IVPB 1 g/50 mL premix     1 g 100 mL/hr over 30 Minutes Intravenous Every 8 hours 06/10/14 1917 06/11/14 0324   06/09/14 1311  ceFAZolin (ANCEF) IVPB 2 g/50 mL premix     2 g 100 mL/hr over 30 Minutes Intravenous 30  min pre-op 06/09/14 1311 06/10/14 1411       Patient was given sequential compression devices and early ambulation to prevent DVT.   Patient benefited maximally from hospital stay and there were no complications. At the time of discharge, the patient was urinating/moving their bowels without difficulty, tolerating a regular diet, pain is controlled with oral pain medications and they have been cleared by PT/OT.   Recent vital signs: Patient Vitals for the past 24 hrs:  BP Temp Temp src Pulse Resp SpO2  06/14/14 1053 (!) 168/61 mmHg - - - - -  06/14/14 0554 (!) 168/61 mmHg 98.1 F (36.7 C) Oral (!) 56 16 94 %  06/13/14 2125 (!) 157/58 mmHg 98.2 F (36.8 C) Oral (!) 58 16 93 %     Recent laboratory studies: No results for input(s): WBC, HGB, HCT, PLT, NA, K, CL, CO2, BUN, CREATININE, GLUCOSE, INR, CALCIUM in the last 72 hours.  Invalid input(s): PT, 2   Discharge Medications:     Medication List    STOP taking these medications        acetaminophen 500 MG tablet  Commonly known as:  TYLENOL     celecoxib 200 MG capsule  Commonly known as:  CELEBREX      TAKE these medications        amLODipine  2.5 MG tablet  Commonly known as:  NORVASC  Take 2.5 mg by mouth daily.     aspirin EC 81 MG tablet  Take 81 mg by mouth daily.     glucosamine-chondroitin 500-400 MG tablet  Take 1 tablet by mouth 2 (two) times daily.     HYDROcodone-acetaminophen 5-325 MG per tablet  Commonly known as:  NORCO  Take 1 tablet by mouth every 4 (four) hours as needed for moderate pain.     levothyroxine 125 MCG tablet  Commonly known as:  SYNTHROID, LEVOTHROID  Take 125 mcg by mouth daily before breakfast.     methocarbamol 500 MG tablet  Commonly known as:  ROBAXIN  Take 1 tablet (500 mg total) by mouth 3 (three) times daily as needed for muscle spasms.     MULTIVITAMIN PO  Take 1 tablet by mouth daily.     omeprazole 20 MG capsule  Commonly known as:  PRILOSEC  Take 20 mg by mouth  daily as needed (heartburn/acid reflux).     ondansetron 4 MG tablet  Commonly known as:  ZOFRAN  Take 1 tablet (4 mg total) by mouth every 8 (eight) hours as needed for nausea or vomiting.     triamterene-hydrochlorothiazide 37.5-25 MG per tablet  Commonly known as:  MAXZIDE-25  Take 1 tablet by mouth daily.        Diagnostic Studies: Dg Lumbar Spine 1 View  06/24/14   CLINICAL DATA:  Lumbar decompression L3-L5.  EXAM: DG C-ARM 61-120 MIN; LUMBAR SPINE - 1 VIEW  : COMPARISON:  None  FINDINGS: No comparison imaging to correlate spinal numbering. When numbered from below, surgical probes project at the inferior aspect of the L3 vertebral body at the mid L5 body.  There is lumbar degenerative disc disease with L3-4 retrolisthesis and L4-5 anterolisthesis. Diffuse disc narrowing, especially at L5-S1.  IMPRESSION: Fluoroscopy for intraoperative localization.   Electronically Signed   By: Monte Fantasia M.D.   On: 2014-06-24 18:01   Dg C-arm 1-60 Min  06-24-14   CLINICAL DATA:  Lumbar decompression L3-L5.  EXAM: DG C-ARM 61-120 MIN; LUMBAR SPINE - 1 VIEW  : COMPARISON:  None  FINDINGS: No comparison imaging to correlate spinal numbering. When numbered from below, surgical probes project at the inferior aspect of the L3 vertebral body at the mid L5 body.  There is lumbar degenerative disc disease with L3-4 retrolisthesis and L4-5 anterolisthesis. Diffuse disc narrowing, especially at L5-S1.  IMPRESSION: Fluoroscopy for intraoperative localization.   Electronically Signed   By: Monte Fantasia M.D.   On: 06/24/2014 18:01     Hospital course: Patient s/p lumbar decompression and in situ fusion for stenosis. Surgery complicated by CSF leak. HOD #1 patient slowly elevated HOB without spinal headache. Patient has remained symptom free with respect to CSF leak.  Wound intact, no neuro deficits, neuropathic pain significantly improved from pre-op, no spinal HA.       Follow-up Information     Follow up with Dahlia Bailiff, MD. Schedule an appointment as soon as possible for a visit in 10 days.   Specialty:  Orthopedic Surgery   Why:  For suture removal, For wound re-check   Contact information:   [address] [place] [address] [phone]       Discharge Plan:  discharge to [facility]  Disposition: Doing well.  F/u in 10 days for wound check    Signed: Melina Schools D for Dr. Melina Schools [facility] [phone]  06/14/2014, 12:03 PM

## 2014-06-14 NOTE — Discharge Planning (Signed)
Patient to discharge to [facility]. Patient family to transport patient. RN to call report to [phone].  Discharge packet complete and placed on patient's chart.  Lubertha Sayres, [place] Cell: [phone]       Fax: [phone] Clinical Social Work: Orthopedics [phone]) and Surgical [phone])

## 2014-07-31 ENCOUNTER — Emergency Department (HOSPITAL_COMMUNITY)
Admission: EM | Admit: 2014-07-31 | Discharge: 2014-07-31 | Disposition: A | Discharge status: Home / Self Care | Payer: Medicare Other | Attending: Emergency Medicine | Admitting: Emergency Medicine

## 2014-07-31 ENCOUNTER — Encounter (HOSPITAL_COMMUNITY): Payer: Self-pay | Admitting: Emergency Medicine

## 2014-07-31 DIAGNOSIS — M7989 Other specified soft tissue disorders: Secondary | ICD-10-CM | POA: Diagnosis not present

## 2014-07-31 DIAGNOSIS — I10 Essential (primary) hypertension: Secondary | ICD-10-CM | POA: Insufficient documentation

## 2014-07-31 DIAGNOSIS — K219 Gastro-esophageal reflux disease without esophagitis: Secondary | ICD-10-CM | POA: Diagnosis not present

## 2014-07-31 DIAGNOSIS — Z85828 Personal history of other malignant neoplasm of skin: Secondary | ICD-10-CM | POA: Diagnosis not present

## 2014-07-31 DIAGNOSIS — M7981 Nontraumatic hematoma of soft tissue: Secondary | ICD-10-CM | POA: Diagnosis not present

## 2014-07-31 DIAGNOSIS — Z79899 Other long term (current) drug therapy: Secondary | ICD-10-CM | POA: Diagnosis not present

## 2014-07-31 DIAGNOSIS — Z8701 Personal history of pneumonia (recurrent): Secondary | ICD-10-CM | POA: Diagnosis not present

## 2014-07-31 DIAGNOSIS — Z7982 Long term (current) use of aspirin: Secondary | ICD-10-CM | POA: Insufficient documentation

## 2014-07-31 DIAGNOSIS — E039 Hypothyroidism, unspecified: Secondary | ICD-10-CM | POA: Diagnosis not present

## 2014-07-31 DIAGNOSIS — M79662 Pain in left lower leg: Secondary | ICD-10-CM

## 2014-07-31 DIAGNOSIS — M79605 Pain in left leg: Secondary | ICD-10-CM | POA: Diagnosis present

## 2014-07-31 DIAGNOSIS — R58 Hemorrhage, not elsewhere classified: Secondary | ICD-10-CM

## 2014-07-31 DIAGNOSIS — M1990 Unspecified osteoarthritis, unspecified site: Secondary | ICD-10-CM | POA: Diagnosis not present

## 2014-07-31 LAB — APTT: APTT: 30 s (ref 24–37)

## 2014-07-31 LAB — BASIC METABOLIC PANEL
ANION GAP: 13 (ref 5–15)
BUN: 33 mg/dL — AB (ref 6–20)
CO2: 25 mmol/L (ref 22–32)
CREATININE: 1.11 mg/dL — AB (ref 0.44–1.00)
Calcium: 9.5 mg/dL (ref 8.9–10.3)
Chloride: 100 mmol/L — ABNORMAL LOW (ref 101–111)
GFR calc Af Amer: 51 mL/min — ABNORMAL LOW (ref >60)
GFR, EST NON AFRICAN AMERICAN: 44 mL/min — AB (ref >60)
GLUCOSE: 97 mg/dL (ref 65–99)
Potassium: 3.8 mmol/L (ref 3.5–5.1)
Sodium: 138 mmol/L (ref 135–145)

## 2014-07-31 LAB — CBC
HEMATOCRIT: 35 % — AB (ref 36.0–46.0)
Hemoglobin: 11.3 g/dL — ABNORMAL LOW (ref 12.0–15.0)
MCH: 30.1 pg (ref 26.0–34.0)
MCHC: 32.3 g/dL (ref 30.0–36.0)
MCV: 93.1 fL (ref 78.0–100.0)
Platelets: 252 10*3/uL (ref 150–400)
RBC: 3.76 MIL/uL — AB (ref 3.87–5.11)
RDW: 13.7 % (ref 11.5–15.5)
WBC: 7.4 10*3/uL (ref 4.0–10.5)

## 2014-07-31 LAB — PROTIME-INR
INR: 1.01 (ref 0.00–1.49)
PROTHROMBIN TIME: 13.5 s (ref 11.6–15.2)

## 2014-07-31 MED ORDER — ENOXAPARIN SODIUM 100 MG/ML ~~LOC~~ SOLN
1.0000 mg/kg | Freq: Once | SUBCUTANEOUS | Status: AC
  Administered 2014-07-31: 75 mg via SUBCUTANEOUS
  Filled 2014-07-31: qty 1

## 2014-07-31 NOTE — ED Provider Notes (Signed)
CSN: [id number]     Arrival date & time 07/31/14  1941 History   First MD Initiated Contact with Patient 07/31/14 2002     Chief Complaint  Patient presents with  . Leg Pain     (Consider location/radiation/quality/duration/timing/severity/associated sxs/prior Treatment) HPI Comments: 81 year old female, history of lower back pain and surgery 7 weeks ago, approximately 6 weeks postop (7 days ago) the patient had acute onset of pain in her left proximal medial thigh at night, in the morning she noticed that she had bruising and swelling of the area, over the last week this has been persistently painful swollen and bruised and now the blood is settling down towards her calf. She has some difficulty with ambulation secondary to pain, she has no shortness of breath or chest pain, she is not on any blood thinners. This is worse with palpation, nothing makes it better, no associated history of blood clots, no significant history of anemia.  She called her orthopedist today, recommended come to the ER for evaluation of blood clot.  Patient is a 81 y.o. female presenting with leg pain. The history is provided by the patient and the spouse.  Leg Pain   Past Medical History  Diagnosis Date  . Hypothyroidism   . Hypertension   . GERD (gastroesophageal reflux disease)   . Arthritis   . Sinus bradycardia     states rates in 40's and low 50's are normal for her  . Pneumonia     as a child  . Headache     in younger years  . Cancer     skin cancer  . PONV (postoperative nausea and vomiting)    Past Surgical History  Procedure Laterality Date  . Eye surgery Bilateral     cataracts removed and lens implant  . Tonsillectomy    . Vaginal hysterectomy    . Joint replacement Bilateral     knees  . Bilateral carpal tunnel release    . Colonoscopy    . Lumbar laminectomy/decompression microdiscectomy N/A 06/10/2014    Procedure: LUMBAR DECOMPRESSION L3 - L5 2 LEVELS;  Surgeon: Melina Schools, MD;   Location: [facility];  Service: Orthopedics;  Laterality: N/A;   Family History  Problem Relation Age of Onset  . Congestive Heart Failure Mother   . Heart attack Father    History  Substance Use Topics  . Smoking status: Never Smoker   . Smokeless tobacco: Never Used  . Alcohol Use: Yes     Comment: occ wine   OB History    No data available     Review of Systems  All other systems reviewed and are negative.     Allergies  Review of patient's allergies indicates no known allergies.  Home Medications   Prior to Admission medications   Medication Sig Start Date End Date Taking? Authorizing Provider  amLODipine (NORVASC) 2.5 MG tablet Take 2.5 mg by mouth daily. 05/12/14  Yes Historical Provider, MD  aspirin EC 81 MG tablet Take 81 mg by mouth daily.   Yes Historical Provider, MD  glucosamine-chondroitin 500-400 MG tablet Take 1 tablet by mouth daily.    Yes Historical Provider, MD  levothyroxine (SYNTHROID, LEVOTHROID) 125 MCG tablet Take 125 mcg by mouth daily before breakfast. 03/23/14  Yes Historical Provider, MD  Multiple Vitamins-Minerals (MULTIVITAMIN PO) Take 1 tablet by mouth daily.   Yes Historical Provider, MD  omeprazole (PRILOSEC) 20 MG capsule Take 20 mg by mouth daily as needed (heartburn/acid reflux).  Yes Historical Provider, MD  triamterene-hydrochlorothiazide (MAXZIDE-25) 37.5-25 MG per tablet Take 1 tablet by mouth daily. 06/01/14  Yes Historical Provider, MD  HYDROcodone-acetaminophen (NORCO) 5-325 MG per tablet Take 1 tablet by mouth every 4 (four) hours as needed for moderate pain. Patient not taking: Reported on 07/31/2014 06/14/14   Melina Schools, MD  methocarbamol (ROBAXIN) 500 MG tablet Take 1 tablet (500 mg total) by mouth 3 (three) times daily as needed for muscle spasms. Patient not taking: Reported on 07/31/2014 06/14/14   Melina Schools, MD  ondansetron (ZOFRAN) 4 MG tablet Take 1 tablet (4 mg total) by mouth every 8 (eight) hours as needed for nausea or  vomiting. Patient not taking: Reported on 07/31/2014 06/14/14   Melina Schools, MD   BP 144/63 mmHg  Pulse 64  Temp(Src) 97.7 F (36.5 C) (Oral)  Resp 20  Ht 5\' 1"  (1.549 m)  Wt 160 lb (72.576 kg)  BMI 30.25 kg/m2  SpO2 96% Physical Exam  Constitutional: She appears well-developed and well-nourished. No distress.  HENT:  Head: Normocephalic and atraumatic.  Mouth/Throat: Oropharynx is clear and moist. No oropharyngeal exudate.  Eyes: Conjunctivae and EOM are normal. Pupils are equal, round, and reactive to light. Right eye exhibits no discharge. Left eye exhibits no discharge. No scleral icterus.  Neck: Normal range of motion. Neck supple. No JVD present. No thyromegaly present.  Cardiovascular: Normal rate, regular rhythm, normal heart sounds and intact distal pulses.  Exam reveals no gallop and no friction rub.   No murmur heard. Pulmonary/Chest: Effort normal and breath sounds normal. No respiratory distress. She has no wheezes. She has no rales.  Abdominal: Soft. Bowel sounds are normal. She exhibits no distension and no mass. There is no tenderness.  Musculoskeletal: Normal range of motion. She exhibits edema (scant left ankle edema) and tenderness (tenderness to the left medial thigh, left medial knee and left calf, bruising present in these areas).  Lymphadenopathy:    She has no cervical adenopathy.  Neurological: She is alert. Coordination normal.  Skin: Skin is warm and dry. No rash noted. No erythema.  Psychiatric: She has a normal mood and affect. Her behavior is normal.  Nursing note and vitals reviewed.   ED Course  Procedures (including critical care time) Labs Review Labs Reviewed  CBC - Abnormal; Notable for the following:    RBC 3.76 (*)    Hemoglobin 11.3 (*)    HCT 35.0 (*)    All other components within normal limits  BASIC METABOLIC PANEL - Abnormal; Notable for the following:    Chloride 100 (*)    BUN 33 (*)    Creatinine, Ser 1.11 (*)    GFR calc non  Af Amer 44 (*)    GFR calc Af Amer 51 (*)    All other components within normal limits  APTT  PROTIME-INR    Imaging Review No results found.    MDM   Final diagnoses:  Pain and swelling of left lower leg  Ecchymosis    The patient does not have any compression deformities of the left popliteal vein or common femoral vein or superficial femoral vein on my exam, she has some risk for DVT being postop and relatively immobile, we'll check labs, given dose of Lovenox, anticipate discharge for follow-up ultrasound in the morning. No signs of PE at this time.Noemi Chapel, MD 08/01/14 [phone]

## 2014-07-31 NOTE — ED Notes (Signed)
Pt observed ambulating in the hallway without difficulty.

## 2014-07-31 NOTE — ED Notes (Signed)
Pt arrived to the ED with a complaint of left leg pain.  Pt has had pain for a week.  Pt has a large hematoma on the inner upper thigh area.  Pt had back surgery 31March 2016.  Pt also has had venous surgery.  Pt states the bruise formed a week ago and has since in the estimation of the patient diminished.

## 2014-07-31 NOTE — Discharge Instructions (Signed)
Please call your doctor for a followup appointment within 24-48 hours. When you talk to your doctor please let them know that you were seen in the emergency department and have them acquire all of your records so that they can discuss the findings with you and formulate a treatment plan to fully care for your new and ongoing problems.  We have given you the medicine as a blood thinner in case you have a blood clot - you must return in the morning for the ultrasound of your leg.

## 2014-08-01 ENCOUNTER — Ambulatory Visit (HOSPITAL_COMMUNITY)
Admission: RE | Admit: 2014-08-01 | Discharge: 2014-08-01 | Disposition: A | Discharge status: Home / Self Care | Payer: Medicare Other | Source: Ambulatory Visit | Attending: Emergency Medicine | Admitting: Emergency Medicine

## 2014-08-01 DIAGNOSIS — R609 Edema, unspecified: Secondary | ICD-10-CM | POA: Insufficient documentation

## 2014-08-01 DIAGNOSIS — M79609 Pain in unspecified limb: Secondary | ICD-10-CM | POA: Diagnosis not present

## 2014-08-01 NOTE — Progress Notes (Signed)
VASCULAR LAB PRELIMINARY  PRELIMINARY  PRELIMINARY  PRELIMINARY  Left lower extremity venous duplex completed.    Preliminary report:  Left:  No evidence of DVT, superficial thrombosis, or Baker's cyst.  Smith Mcnicholas, RVS 08/01/2014, 11:49 AM

## 2015-05-19 DIAGNOSIS — Z0000 Encounter for general adult medical examination without abnormal findings: Secondary | ICD-10-CM | POA: Diagnosis not present

## 2015-05-19 DIAGNOSIS — Z6832 Body mass index (BMI) 32.0-32.9, adult: Secondary | ICD-10-CM | POA: Diagnosis not present

## 2015-05-19 DIAGNOSIS — Z1389 Encounter for screening for other disorder: Secondary | ICD-10-CM | POA: Diagnosis not present

## 2015-06-02 ENCOUNTER — Inpatient Hospital Stay (HOSPITAL_COMMUNITY)
Admission: EM | Admit: 2015-06-02 | Discharge: 2015-06-05 | DRG: 311 | Disposition: A | Discharge status: Home / Self Care | Payer: PPO | Attending: Internal Medicine | Admitting: Internal Medicine

## 2015-06-02 ENCOUNTER — Emergency Department (HOSPITAL_COMMUNITY): Payer: PPO

## 2015-06-02 DIAGNOSIS — N189 Chronic kidney disease, unspecified: Secondary | ICD-10-CM | POA: Diagnosis present

## 2015-06-02 DIAGNOSIS — R7881 Bacteremia: Secondary | ICD-10-CM | POA: Diagnosis not present

## 2015-06-02 DIAGNOSIS — N390 Urinary tract infection, site not specified: Secondary | ICD-10-CM

## 2015-06-02 DIAGNOSIS — I249 Acute ischemic heart disease, unspecified: Secondary | ICD-10-CM | POA: Diagnosis present

## 2015-06-02 DIAGNOSIS — B9620 Unspecified Escherichia coli [E. coli] as the cause of diseases classified elsewhere: Secondary | ICD-10-CM

## 2015-06-02 DIAGNOSIS — I129 Hypertensive chronic kidney disease with stage 1 through stage 4 chronic kidney disease, or unspecified chronic kidney disease: Secondary | ICD-10-CM | POA: Diagnosis present

## 2015-06-02 DIAGNOSIS — I248 Other forms of acute ischemic heart disease: Secondary | ICD-10-CM | POA: Diagnosis not present

## 2015-06-02 DIAGNOSIS — I2129 ST elevation (STEMI) myocardial infarction involving other sites: Secondary | ICD-10-CM

## 2015-06-02 DIAGNOSIS — R05 Cough: Secondary | ICD-10-CM | POA: Diagnosis not present

## 2015-06-02 DIAGNOSIS — E039 Hypothyroidism, unspecified: Secondary | ICD-10-CM | POA: Diagnosis present

## 2015-06-02 DIAGNOSIS — Z7982 Long term (current) use of aspirin: Secondary | ICD-10-CM | POA: Diagnosis not present

## 2015-06-02 DIAGNOSIS — R778 Other specified abnormalities of plasma proteins: Secondary | ICD-10-CM

## 2015-06-02 DIAGNOSIS — N179 Acute kidney failure, unspecified: Secondary | ICD-10-CM | POA: Diagnosis not present

## 2015-06-02 DIAGNOSIS — R7989 Other specified abnormal findings of blood chemistry: Secondary | ICD-10-CM

## 2015-06-02 DIAGNOSIS — I200 Unstable angina: Secondary | ICD-10-CM | POA: Diagnosis not present

## 2015-06-02 DIAGNOSIS — Z85828 Personal history of other malignant neoplasm of skin: Secondary | ICD-10-CM

## 2015-06-02 DIAGNOSIS — K219 Gastro-esophageal reflux disease without esophagitis: Secondary | ICD-10-CM | POA: Diagnosis not present

## 2015-06-02 DIAGNOSIS — I2489 Other forms of acute ischemic heart disease: Secondary | ICD-10-CM

## 2015-06-02 DIAGNOSIS — I10 Essential (primary) hypertension: Secondary | ICD-10-CM

## 2015-06-02 DIAGNOSIS — Z79899 Other long term (current) drug therapy: Secondary | ICD-10-CM | POA: Diagnosis not present

## 2015-06-02 DIAGNOSIS — R112 Nausea with vomiting, unspecified: Secondary | ICD-10-CM

## 2015-06-02 DIAGNOSIS — R404 Transient alteration of awareness: Secondary | ICD-10-CM | POA: Diagnosis not present

## 2015-06-02 DIAGNOSIS — Z96653 Presence of artificial knee joint, bilateral: Secondary | ICD-10-CM | POA: Diagnosis not present

## 2015-06-02 DIAGNOSIS — R0602 Shortness of breath: Secondary | ICD-10-CM | POA: Diagnosis not present

## 2015-06-02 DIAGNOSIS — A419 Sepsis, unspecified organism: Secondary | ICD-10-CM | POA: Diagnosis not present

## 2015-06-02 DIAGNOSIS — Z8249 Family history of ischemic heart disease and other diseases of the circulatory system: Secondary | ICD-10-CM | POA: Diagnosis not present

## 2015-06-02 DIAGNOSIS — R531 Weakness: Secondary | ICD-10-CM | POA: Diagnosis not present

## 2015-06-02 HISTORY — DX: Other forms of acute ischemic heart disease: I24.8

## 2015-06-02 HISTORY — DX: Essential (primary) hypertension: I10

## 2015-06-02 HISTORY — DX: Urinary tract infection, site not specified: N39.0

## 2015-06-02 HISTORY — DX: Chronic kidney disease, unspecified: N18.9

## 2015-06-02 HISTORY — DX: Acute kidney failure, unspecified: N17.9

## 2015-06-02 LAB — COMPREHENSIVE METABOLIC PANEL
ALBUMIN: 3.3 g/dL — AB (ref 3.5–5.0)
ALT: 15 U/L (ref 14–54)
ANION GAP: 15 (ref 5–15)
AST: 30 U/L (ref 15–41)
Alkaline Phosphatase: 43 U/L (ref 38–126)
BUN: 40 mg/dL — ABNORMAL HIGH (ref 6–20)
CHLORIDE: 99 mmol/L — AB (ref 101–111)
CO2: 20 mmol/L — AB (ref 22–32)
Calcium: 8.7 mg/dL — ABNORMAL LOW (ref 8.9–10.3)
Creatinine, Ser: 1.91 mg/dL — ABNORMAL HIGH (ref 0.44–1.00)
GFR calc non Af Amer: 22 mL/min — ABNORMAL LOW (ref >60)
GFR, EST AFRICAN AMERICAN: 26 mL/min — AB (ref >60)
GLUCOSE: 156 mg/dL — AB (ref 65–99)
Potassium: 3.7 mmol/L (ref 3.5–5.1)
Sodium: 134 mmol/L — ABNORMAL LOW (ref 135–145)
Total Bilirubin: 0.9 mg/dL (ref 0.3–1.2)
Total Protein: 7.1 g/dL (ref 6.5–8.1)

## 2015-06-02 LAB — I-STAT CG4 LACTIC ACID, ED: Lactic Acid, Venous: 1.41 mmol/L (ref 0.5–2.0)

## 2015-06-02 LAB — I-STAT CHEM 8, ED
BUN: 39 mg/dL — ABNORMAL HIGH (ref 6–20)
Calcium, Ion: 1.02 mmol/L — ABNORMAL LOW (ref 1.13–1.30)
Chloride: 98 mmol/L — ABNORMAL LOW (ref 101–111)
Creatinine, Ser: 1.9 mg/dL — ABNORMAL HIGH (ref 0.44–1.00)
Glucose, Bld: 152 mg/dL — ABNORMAL HIGH (ref 65–99)
HCT: 36 % (ref 36.0–46.0)
Hemoglobin: 12.2 g/dL (ref 12.0–15.0)
Potassium: 3.6 mmol/L (ref 3.5–5.1)
Sodium: 134 mmol/L — ABNORMAL LOW (ref 135–145)
TCO2: 21 mmol/L (ref 0–100)

## 2015-06-02 LAB — CBC
HCT: 37.8 % (ref 36.0–46.0)
Hemoglobin: 12.8 g/dL (ref 12.0–15.0)
MCH: 29.4 pg (ref 26.0–34.0)
MCHC: 33.9 g/dL (ref 30.0–36.0)
MCV: 86.9 fL (ref 78.0–100.0)
PLATELETS: 142 10*3/uL — AB (ref 150–400)
RBC: 4.35 MIL/uL (ref 3.87–5.11)
RDW: 14.1 % (ref 11.5–15.5)
WBC: 10.7 10*3/uL — ABNORMAL HIGH (ref 4.0–10.5)

## 2015-06-02 LAB — I-STAT TROPONIN, ED: TROPONIN I, POC: 7.4 ng/mL — AB (ref 0.00–0.08)

## 2015-06-02 MED ORDER — SODIUM CHLORIDE 0.9 % IV BOLUS (SEPSIS)
1000.0000 mL | Freq: Once | INTRAVENOUS | Status: AC
  Administered 2015-06-02: 1000 mL via INTRAVENOUS

## 2015-06-02 MED ORDER — SODIUM CHLORIDE 0.9 % IV BOLUS (SEPSIS)
500.0000 mL | Freq: Once | INTRAVENOUS | Status: AC
  Administered 2015-06-02: 500 mL via INTRAVENOUS

## 2015-06-02 MED ORDER — HEPARIN SODIUM (PORCINE) 5000 UNIT/ML IJ SOLN
INTRAMUSCULAR | Status: AC
  Filled 2015-06-02: qty 1

## 2015-06-02 MED ORDER — HEPARIN BOLUS VIA INFUSION
3000.0000 [IU] | Freq: Once | INTRAVENOUS | Status: AC
  Administered 2015-06-02: 3000 [IU] via INTRAVENOUS
  Filled 2015-06-02: qty 3000

## 2015-06-02 MED ORDER — ASPIRIN 325 MG PO TABS
325.0000 mg | ORAL_TABLET | Freq: Every day | ORAL | Status: DC
  Administered 2015-06-02: 325 mg via ORAL
  Filled 2015-06-02: qty 1

## 2015-06-02 MED ORDER — HEPARIN (PORCINE) IN NACL 100-0.45 UNIT/ML-% IJ SOLN
1000.0000 [IU]/h | INTRAMUSCULAR | Status: DC
  Administered 2015-06-02: 900 [IU]/h via INTRAVENOUS
  Filled 2015-06-02: qty 250

## 2015-06-02 NOTE — ED Notes (Signed)
Code STEMI cancelled per Cardiology MD and EDP.

## 2015-06-02 NOTE — H&P (Signed)
[facility] History and Physical  PCP: Mathews Argyle, MD  History of Present Illness (and review of medical records): April Hodges is a 82 y.o. female who presents for evaluation of nausea and vomiting.  She has hx of HTN, hypothyrodism, GERD with no prior MI or known CAD.  She states after dinner at her independent living facility on Monday night, she had acute onset of vomiting.  She had several episodes of Monday night into Tuesday.  This was associated with fevers, chills, and night sweats.  She denied any chest pain or shortness of breath.  She had decreased po intake following days but N/V improved.  She was evalauted by RN at facility tonight due to patient not sounding well per son over the phone.  EMS was called and she was brought to the ED for further evaluation.  She was initially called a STEMI.  This was discussed with on call internationalist and STEMI was cancelled after review.  She was evaluated in ED and found to have elevated troponin of 7.4.  She is comfortable with son at bedside with chest pain or shortness of breath and hemodynamically stable.  Previous diagnostic testing for coronary artery disease includes: none. Previous history of cardiac disease includes None. Coronary artery disease risk factors include: advanced age (older than 55 for men, 58 for women), hypertension and obesity (BMI >= 30 kg/m2).  Patient denies history of angina, arrhythmia, cardiomyopathy, coronary artery disease, ischemic heart disease, previous M.I. and valvular disease.  Review of Systems She reports that there is a viral syndrome at facility.  She had some loose stools this week as she has only been able to hydrate and not eat much solid foods.  She denies abdominal pain. Further review of systems was negative other than stated in HPI.  Patient Active Problem List   Diagnosis Date Noted  . Back pain 06/10/2014   Past Medical History  Diagnosis Date  . Hypothyroidism   .  Hypertension   . GERD (gastroesophageal reflux disease)   . Arthritis   . Sinus bradycardia     states rates in 40's and low 50's are normal for her  . Pneumonia     as a child  . Headache     in younger years  . Cancer     skin cancer  . PONV (postoperative nausea and vomiting)     Past Surgical History  Procedure Laterality Date  . Eye surgery Bilateral     cataracts removed and lens implant  . Tonsillectomy    . Vaginal hysterectomy    . Joint replacement Bilateral     knees  . Bilateral carpal tunnel release    . Colonoscopy    . Lumbar laminectomy/decompression microdiscectomy N/A 06/10/2014    Procedure: LUMBAR DECOMPRESSION L3 - L5 2 LEVELS;  Surgeon: Melina Schools, MD;  Location: [facility];  Service: Orthopedics;  Laterality: N/A;     Current facility-administered medications:  .  aspirin tablet 325 mg, 325 mg, Oral, Daily, Lajean Saver, MD, 325 mg at 06/02/15 2215 .  heparin 5000 UNIT/ML injection, , , , , Stopped at 06/02/15 2217 .  sodium chloride 0.9 % bolus 500 mL, 500 mL, Intravenous, Once, Lajean Saver, MD  Current outpatient prescriptions:  .  amLODipine (NORVASC) 2.5 MG tablet, Take 2.5 mg by mouth daily., Disp: , Rfl: 6 .  aspirin EC 81 MG tablet, Take 81 mg by mouth daily., Disp: , Rfl:  .  glucosamine-chondroitin 500-400 MG tablet, Take  1 tablet by mouth daily. , Disp: , Rfl:  .  HYDROcodone-acetaminophen (NORCO) 5-325 MG per tablet, Take 1 tablet by mouth every 4 (four) hours as needed for moderate pain. (Patient not taking: Reported on 07/31/2014), Disp: 90 tablet, Rfl: 0 .  levothyroxine (SYNTHROID, LEVOTHROID) 125 MCG tablet, Take 125 mcg by mouth daily before breakfast., Disp: , Rfl: 3 .  methocarbamol (ROBAXIN) 500 MG tablet, Take 1 tablet (500 mg total) by mouth 3 (three) times daily as needed for muscle spasms. (Patient not taking: Reported on 07/31/2014), Disp: 60 tablet, Rfl: 0 .  Multiple Vitamins-Minerals (MULTIVITAMIN PO), Take 1 tablet by mouth  daily., Disp: , Rfl:  .  omeprazole (PRILOSEC) 20 MG capsule, Take 20 mg by mouth daily as needed (heartburn/acid reflux)., Disp: , Rfl:  .  ondansetron (ZOFRAN) 4 MG tablet, Take 1 tablet (4 mg total) by mouth every 8 (eight) hours as needed for nausea or vomiting. (Patient not taking: Reported on 07/31/2014), Disp: 20 tablet, Rfl: 0 .  triamterene-hydrochlorothiazide (MAXZIDE-25) 37.5-25 MG per tablet, Take 1 tablet by mouth daily., Disp: , Rfl: 2 No Known Allergies  Social History  Substance Use Topics  . Smoking status: Never Smoker   . Smokeless tobacco: Never Used  . Alcohol Use: Yes     Comment: occ wine    Family History  Problem Relation Age of Onset  . Congestive Heart Failure Mother   . Heart attack Father      Objective:  Patient Vitals for the past 8 hrs:  BP Temp Temp src Pulse Resp SpO2  06/02/15 2245 103/58 mmHg - - 67 22 94 %  06/02/15 2231 100/55 mmHg - - 73 20 95 %  06/02/15 2215 (!) 110/54 mmHg - - 76 20 95 %  06/02/15 2200 117/56 mmHg - - 73 20 97 %  06/02/15 2153 (!) 113/53 mmHg 98.7 F (37.1 C) Oral 76 (!) 28 97 %   General appearance: alert, cooperative, appears stated age and no distress Head: Normocephalic, without obvious abnormality, atraumatic Eyes: PERRL, EOM's intact. Neck: no carotid bruit, no JVD and supple, Lungs: clear to auscultation bilaterally Chest wall: no tenderness Heart: regular rate and rhythm, S1, S2 normal Abdomen: soft, non-tender; bowel sounds normal Extremities: extremities normal, atraumatic, no edema Pulses: 2+ and symmetric Neurologic: Grossly normal  Results for orders placed or performed during the hospital encounter of 06/02/15 (from the past 48 hour(s))  CBC     Status: Abnormal   Collection Time: 06/02/15 10:16 PM  Result Value Ref Range   WBC 10.7 (H) 4.0 - 10.5 K/uL   RBC 4.35 3.87 - 5.11 MIL/uL   Hemoglobin 12.8 12.0 - 15.0 g/dL   HCT 37.8 36.0 - 46.0 %   MCV 86.9 78.0 - 100.0 fL   MCH 29.4 26.0 - 34.0 pg    MCHC 33.9 30.0 - 36.0 g/dL   RDW 14.1 11.5 - 15.5 %   Platelets 142 (L) 150 - 400 K/uL  I-stat troponin, ED     Status: Abnormal   Collection Time: 06/02/15 10:25 PM  Result Value Ref Range   Troponin i, poc 7.40 (HH) 0.00 - 0.08 ng/mL   Comment NOTIFIED PHYSICIAN    Comment 3            Comment: Due to the release kinetics of cTnI, a negative result within the first hours of the onset of symptoms does not rule out myocardial infarction with certainty. If myocardial infarction is still suspected, repeat the  test at appropriate intervals.   I-stat chem 8, ed     Status: Abnormal   Collection Time: 06/02/15 10:26 PM  Result Value Ref Range   Sodium 134 (L) 135 - 145 mmol/L   Potassium 3.6 3.5 - 5.1 mmol/L   Chloride 98 (L) 101 - 111 mmol/L   BUN 39 (H) 6 - 20 mg/dL   Creatinine, Ser 1.90 (H) 0.44 - 1.00 mg/dL   Glucose, Bld 152 (H) 65 - 99 mg/dL   Calcium, Ion 1.02 (L) 1.13 - 1.30 mmol/L   TCO2 21 0 - 100 mmol/L   Hemoglobin 12.2 12.0 - 15.0 g/dL   HCT 36.0 36.0 - 46.0 %  I-Stat CG4 Lactic Acid, ED     Status: None   Collection Time: 06/02/15 10:27 PM  Result Value Ref Range   Lactic Acid, Venous 1.41 0.5 - 2.0 mmol/L   Dg Chest Port 1 View  06/02/2015  CLINICAL DATA:  82 year old female with shortness of breath and cough EXAM: PORTABLE CHEST 1 VIEW COMPARISON:  None. FINDINGS: Single-view of the chest demonstrates emphysematous changes of the lungs. There is no focal consolidation, pleural effusion, or pneumothorax. The cardiac silhouette is within normal limits. No acute osseous pathology. IMPRESSION: No active disease. Electronically Signed   By: Anner Crete M.D.   On: 06/02/2015 22:15    ECG:  Sinus rhythm HR 75 IRBBB similar to prior, new ST elevation in inferior leads, with TW abnormality concerning for ischemia   Assessment: Acute coronary syndrome, possible late presenting MI Acute kidney injury, likely prerenal 2/2  dehydration Hypertension Hypothyrodism GERD   Plan: 1. Cardiology Admission to ICU for further evaluation and management 2. Continuous monitoring on Telemetry. 3. Repeat ekg on admit, prn chest pain or arrythmia 4. Trend cardiac biomarkers, check lipids, hgba1c, tsh 5. Medical management to include ASA, Heparin,  Low dose BB, Statin, 6. TTE in am to assess LV function and wall motion. 7. Will check for flu, blood and urine cultures.  No indication for empiric antibiotics 8. Volume resuscitation with IVFs, monitor renal function 9. Reconcile and continue home meds as indicated 10. GI proph- home PPI 11. DVT proph-on heparin gtt 12.  Further ischemic evaluation pending initial studies and clinical course.  Discussed with patient and son at bedside.

## 2015-06-02 NOTE — ED Provider Notes (Signed)
CSN: [id number]     Arrival date & time 06/02/15  2144 History   First MD Initiated Contact with Patient 06/02/15 2148     Chief Complaint  Patient presents with  . Code STEMI     (Consider location/radiation/quality/duration/timing/severity/associated sxs/prior Treatment) The history is provided by the patient and the EMS personnel.  Patient c/o nausea and vomiting onset this Monday (3 nights ago) after eating dinner.  Patient noted several episodes nv that night, not bloody or bilious. The next day felt generally weak, but slightly better. Tonight return of nausea, and generally felt weak.  EMS did ecg, and activated a code stemi. On arrival to ED, patient denies any chest pain or discomfort. Generally weak. No focal numbness/weakness. No sob. Denies cough, sore throat, or uri c/o. No abd pain or distension. Did have loose stool but no severe diarrhea. Subjective fever earlier in week. Compliant w normal meds. No dysuria or gu c/o.       Past Medical History  Diagnosis Date  . Hypothyroidism   . Hypertension   . GERD (gastroesophageal reflux disease)   . Arthritis   . Sinus bradycardia     states rates in 40's and low 50's are normal for her  . Pneumonia     as a child  . Headache     in younger years  . Cancer     skin cancer  . PONV (postoperative nausea and vomiting)    Past Surgical History  Procedure Laterality Date  . Eye surgery Bilateral     cataracts removed and lens implant  . Tonsillectomy    . Vaginal hysterectomy    . Joint replacement Bilateral     knees  . Bilateral carpal tunnel release    . Colonoscopy    . Lumbar laminectomy/decompression microdiscectomy N/A 06/10/2014    Procedure: LUMBAR DECOMPRESSION L3 - L5 2 LEVELS;  Surgeon: Melina Schools, MD;  Location: [facility];  Service: Orthopedics;  Laterality: N/A;   Family History  Problem Relation Age of Onset  . Congestive Heart Failure Mother   . Heart attack Father    Social History  Substance Use  Topics  . Smoking status: Never Smoker   . Smokeless tobacco: Never Used  . Alcohol Use: Yes     Comment: occ wine   OB History    No data available     Review of Systems  Constitutional: Positive for fever. Negative for chills.  HENT: Negative for sore throat.   Eyes: Negative for redness.  Respiratory: Negative for shortness of breath.   Cardiovascular: Negative for chest pain.  Gastrointestinal: Positive for nausea and vomiting. Negative for abdominal pain.  Endocrine: Negative for polyuria.  Genitourinary: Negative for dysuria and flank pain.  Musculoskeletal: Negative for back pain and neck pain.  Skin: Negative for rash.  Neurological: Negative for headaches.  Hematological: Does not bruise/bleed easily.  Psychiatric/Behavioral: Negative for confusion.      Allergies  Review of patient's allergies indicates no known allergies.  Home Medications   Prior to Admission medications   Medication Sig Start Date End Date Taking? Authorizing Provider  amLODipine (NORVASC) 2.5 MG tablet Take 2.5 mg by mouth daily. 05/12/14   Historical Provider, MD  aspirin EC 81 MG tablet Take 81 mg by mouth daily.    Historical Provider, MD  glucosamine-chondroitin 500-400 MG tablet Take 1 tablet by mouth daily.     Historical Provider, MD  HYDROcodone-acetaminophen (NORCO) 5-325 MG per tablet Take 1 tablet  by mouth every 4 (four) hours as needed for moderate pain. Patient not taking: Reported on 07/31/2014 06/14/14   Melina Schools, MD  levothyroxine (SYNTHROID, LEVOTHROID) 125 MCG tablet Take 125 mcg by mouth daily before breakfast. 03/23/14   Historical Provider, MD  methocarbamol (ROBAXIN) 500 MG tablet Take 1 tablet (500 mg total) by mouth 3 (three) times daily as needed for muscle spasms. Patient not taking: Reported on 07/31/2014 06/14/14   Melina Schools, MD  Multiple Vitamins-Minerals (MULTIVITAMIN PO) Take 1 tablet by mouth daily.    Historical Provider, MD  omeprazole (PRILOSEC) 20 MG  capsule Take 20 mg by mouth daily as needed (heartburn/acid reflux).    Historical Provider, MD  ondansetron (ZOFRAN) 4 MG tablet Take 1 tablet (4 mg total) by mouth every 8 (eight) hours as needed for nausea or vomiting. Patient not taking: Reported on 07/31/2014 06/14/14   Melina Schools, MD  triamterene-hydrochlorothiazide (MAXZIDE-25) 37.5-25 MG per tablet Take 1 tablet by mouth daily. 06/01/14   Historical Provider, MD   BP 113/53 mmHg  Pulse 76  Temp(Src) 98.7 F (37.1 C) (Oral)  Resp 28  SpO2 97% Physical Exam  Constitutional: She appears well-developed and well-nourished. No distress.  HENT:  Mouth/Throat: Oropharynx is clear and moist.  Eyes: Conjunctivae are normal. No scleral icterus.  Neck: Neck supple. No tracheal deviation present.  No stiffness or rigidity  Cardiovascular: Normal rate, regular rhythm, normal heart sounds and intact distal pulses.  Exam reveals no gallop and no friction rub.   No murmur heard. Pulmonary/Chest: Effort normal and breath sounds normal. No respiratory distress.  Abdominal: Soft. Normal appearance and bowel sounds are normal. She exhibits no distension and no mass. There is no tenderness. There is no rebound and no guarding.  Genitourinary:  No cva tenderness  Musculoskeletal: She exhibits no edema.  Neurological: She is alert.  Skin: Skin is warm and dry. No rash noted. She is not diaphoretic.  Psychiatric: She has a normal mood and affect.  Nursing note and vitals reviewed.   ED Course  Procedures (including critical care time) Labs Review  Results for orders placed or performed during the hospital encounter of 06/02/15  CBC  Result Value Ref Range   WBC 10.7 (H) 4.0 - 10.5 K/uL   RBC 4.35 3.87 - 5.11 MIL/uL   Hemoglobin 12.8 12.0 - 15.0 g/dL   HCT 37.8 36.0 - 46.0 %   MCV 86.9 78.0 - 100.0 fL   MCH 29.4 26.0 - 34.0 pg   MCHC 33.9 30.0 - 36.0 g/dL   RDW 14.1 11.5 - 15.5 %   Platelets 142 (L) 150 - 400 K/uL  I-stat troponin, ED   Result Value Ref Range   Troponin i, poc 7.40 (HH) 0.00 - 0.08 ng/mL   Comment NOTIFIED PHYSICIAN    Comment 3          I-stat chem 8, ed  Result Value Ref Range   Sodium 134 (L) 135 - 145 mmol/L   Potassium 3.6 3.5 - 5.1 mmol/L   Chloride 98 (L) 101 - 111 mmol/L   BUN 39 (H) 6 - 20 mg/dL   Creatinine, Ser 1.90 (H) 0.44 - 1.00 mg/dL   Glucose, Bld 152 (H) 65 - 99 mg/dL   Calcium, Ion 1.02 (L) 1.13 - 1.30 mmol/L   TCO2 21 0 - 100 mmol/L   Hemoglobin 12.2 12.0 - 15.0 g/dL   HCT 36.0 36.0 - 46.0 %  I-Stat CG4 Lactic Acid, ED  Result Value  Ref Range   Lactic Acid, Venous 1.41 0.5 - 2.0 mmol/L   Dg Chest Port 1 View  06/02/2015  CLINICAL DATA:  82 year old female with shortness of breath and cough EXAM: PORTABLE CHEST 1 VIEW COMPARISON:  None. FINDINGS: Single-view of the chest demonstrates emphysematous changes of the lungs. There is no focal consolidation, pleural effusion, or pneumothorax. The cardiac silhouette is within normal limits. No acute osseous pathology. IMPRESSION: No active disease. Electronically Signed   By: Anner Crete M.D.   On: 06/02/2015 22:15       I have personally reviewed and evaluated these images and lab results as part of my medical decision-making.   EKG Interpretation   Date/Time:  Thursday June 02 2015 21:51:03 EDT Ventricular Rate:  75 PR Interval:  198 QRS Duration: 119 QT Interval:  536 QTC Calculation: 599 R Axis:   -80 Text Interpretation:  Sinus rhythm Incomplete right bundle branch block  `st elev inf, prominent t waves diffusely, inverted t v2, ecg is changed  significantly as compared to 05/2014 Confirmed by Ashok Cordia  MD, Lennette Bihari ([id number])  on 06/02/2015 9:56:36 PM      MDM   Iv ns bolus.   EMS gave asa.  Continuous pulse ox and monitor. o2 [place].   Pt was a 'code stemi' prior to arrival, on arrival ecg done, discussed with interventional cardiologist on call, Dr Irish Lack, who reviewed ecg and cancelled code stemi, indicating to  proceed w standard ED workup.    Labs pending.   Troponin v high at 7.4.  Pt denies any chest pain or discomfort. Cardiology called back with markedly elevated troponin - they will admit.   Given hx, ?possible AMI, onset with patients symptoms a couple nights ago, acute changes noted on ecg today, w +trop.   Heparin per pharmacy.   Iv ns.   CRITICAL CARE  RE: acute myocardial infarction, markedly elevated troponin, AKI Performed by: Mirna Mires Total critical care time: 35 minutes Critical care time was exclusive of separately billable procedures and treating other patients. Critical care was necessary to treat or prevent imminent or life-threatening deterioration. Critical care was time spent personally by me on the following activities: development of treatment plan with patient and/or surrogate as well as nursing, discussions with consultants, evaluation of patient's response to treatment, examination of patient, obtaining history from patient or surrogate, ordering and performing treatments and interventions, ordering and review of laboratory studies, ordering and review of radiographic studies, pulse oximetry and re-evaluation of patient's condition.     Lajean Saver, MD 06/02/15 [phone]

## 2015-06-02 NOTE — Progress Notes (Signed)
ANTICOAGULATION CONSULT NOTE - Initial Consult  Pharmacy Consult for heparin Indication: chest pain/ACS  No Known Allergies  Patient Measurements: Height: 5\' 1"  (154.9 cm) Weight: 160 lb 15 oz (73 kg) IBW/kg (Calculated) : 47.8 Heparin Dosing Weight: 65kg  Vital Signs: Temp: 98.7 F (37.1 C) (03/23 2153) Temp Source: Oral (03/23 2153) BP: 100/49 mmHg (03/23 2315) Pulse Rate: 69 (03/23 2315)  Labs:  Recent Labs  06/02/15 2216 06/02/15 2226  HGB 12.8 12.2  HCT 37.8 36.0  PLT 142*  --   CREATININE 1.91* 1.90*    Estimated Creatinine Clearance: 19.1 mL/min (by C-G formula based on Cr of 1.9).   Medical History: Past Medical History  Diagnosis Date  . Hypothyroidism   . Hypertension   . GERD (gastroesophageal reflux disease)   . Arthritis   . Sinus bradycardia     states rates in 40's and low 50's are normal for her  . Pneumonia     as a child  . Headache     in younger years  . Cancer     skin cancer  . PONV (postoperative nausea and vomiting)      Assessment: 82yo female c/o 3d h/o N/V and generalized weakness, called EMS who did ECG and activated code STEMI that was subsequently canceled, initial istat troponin elevated to 7.4, to begin heparin.  Goal of Therapy:  Heparin level 0.3-0.7 units/ml Monitor platelets by anticoagulation protocol: Yes   Plan:  Will give heparin 3000 units IV bolus x1 followed by gtt at 900 units/hr and monitor heparin levels and CBC.  Wynona Neat, PharmD, BCPS  06/02/2015,11:36 PM

## 2015-06-02 NOTE — ED Notes (Signed)
Pt here by EMS for Flu Like Symptoms, ems ran ekg and has changes peaked t waves and elevation. Shows bbb as well. stemi paged out no pain, pt does have fever

## 2015-06-03 ENCOUNTER — Inpatient Hospital Stay (HOSPITAL_COMMUNITY): Payer: PPO

## 2015-06-03 ENCOUNTER — Encounter (HOSPITAL_COMMUNITY): Payer: Self-pay | Admitting: *Deleted

## 2015-06-03 ENCOUNTER — Other Ambulatory Visit (HOSPITAL_COMMUNITY): Payer: PPO

## 2015-06-03 DIAGNOSIS — N179 Acute kidney failure, unspecified: Secondary | ICD-10-CM

## 2015-06-03 DIAGNOSIS — I10 Essential (primary) hypertension: Secondary | ICD-10-CM

## 2015-06-03 DIAGNOSIS — I248 Other forms of acute ischemic heart disease: Secondary | ICD-10-CM | POA: Diagnosis not present

## 2015-06-03 DIAGNOSIS — I2489 Other forms of acute ischemic heart disease: Secondary | ICD-10-CM

## 2015-06-03 DIAGNOSIS — B9620 Unspecified Escherichia coli [E. coli] as the cause of diseases classified elsewhere: Secondary | ICD-10-CM

## 2015-06-03 DIAGNOSIS — N390 Urinary tract infection, site not specified: Secondary | ICD-10-CM | POA: Diagnosis not present

## 2015-06-03 DIAGNOSIS — N189 Chronic kidney disease, unspecified: Secondary | ICD-10-CM

## 2015-06-03 DIAGNOSIS — I200 Unstable angina: Secondary | ICD-10-CM | POA: Diagnosis not present

## 2015-06-03 DIAGNOSIS — I129 Hypertensive chronic kidney disease with stage 1 through stage 4 chronic kidney disease, or unspecified chronic kidney disease: Secondary | ICD-10-CM | POA: Diagnosis not present

## 2015-06-03 DIAGNOSIS — R7881 Bacteremia: Secondary | ICD-10-CM | POA: Diagnosis not present

## 2015-06-03 DIAGNOSIS — A419 Sepsis, unspecified organism: Secondary | ICD-10-CM | POA: Diagnosis not present

## 2015-06-03 HISTORY — DX: Essential (primary) hypertension: I10

## 2015-06-03 HISTORY — DX: Urinary tract infection, site not specified: N39.0

## 2015-06-03 HISTORY — DX: Other forms of acute ischemic heart disease: I24.8

## 2015-06-03 HISTORY — DX: Other forms of acute ischemic heart disease: I24.89

## 2015-06-03 HISTORY — DX: Chronic kidney disease, unspecified: N17.9

## 2015-06-03 LAB — BASIC METABOLIC PANEL
ANION GAP: 9 (ref 5–15)
BUN: 36 mg/dL — ABNORMAL HIGH (ref 6–20)
CALCIUM: 8.2 mg/dL — AB (ref 8.9–10.3)
CHLORIDE: 104 mmol/L (ref 101–111)
CO2: 24 mmol/L (ref 22–32)
Creatinine, Ser: 1.57 mg/dL — ABNORMAL HIGH (ref 0.44–1.00)
GFR calc non Af Amer: 29 mL/min — ABNORMAL LOW (ref >60)
GFR, EST AFRICAN AMERICAN: 33 mL/min — AB (ref >60)
Glucose, Bld: 110 mg/dL — ABNORMAL HIGH (ref 65–99)
Potassium: 4.2 mmol/L (ref 3.5–5.1)
Sodium: 137 mmol/L (ref 135–145)

## 2015-06-03 LAB — CBC
HEMATOCRIT: 34.7 % — AB (ref 36.0–46.0)
Hemoglobin: 11.4 g/dL — ABNORMAL LOW (ref 12.0–15.0)
MCH: 29.1 pg (ref 26.0–34.0)
MCHC: 32.9 g/dL (ref 30.0–36.0)
MCV: 88.5 fL (ref 78.0–100.0)
PLATELETS: 138 10*3/uL — AB (ref 150–400)
RBC: 3.92 MIL/uL (ref 3.87–5.11)
RDW: 14.5 % (ref 11.5–15.5)
WBC: 7.7 10*3/uL (ref 4.0–10.5)

## 2015-06-03 LAB — INFLUENZA PANEL BY PCR (TYPE A & B)
H1N1FLUPCR: NOT DETECTED
INFLAPCR: NEGATIVE
INFLBPCR: NEGATIVE

## 2015-06-03 LAB — URINE MICROSCOPIC-ADD ON

## 2015-06-03 LAB — LACTIC ACID, PLASMA
Lactic Acid, Venous: 0.9 mmol/L (ref 0.5–2.0)
Lactic Acid, Venous: 0.9 mmol/L (ref 0.5–2.0)

## 2015-06-03 LAB — URINALYSIS, ROUTINE W REFLEX MICROSCOPIC
BILIRUBIN URINE: NEGATIVE
Glucose, UA: NEGATIVE mg/dL
Ketones, ur: NEGATIVE mg/dL
NITRITE: NEGATIVE
PROTEIN: 30 mg/dL — AB
Specific Gravity, Urine: 1.015 (ref 1.005–1.030)
pH: 5.5 (ref 5.0–8.0)

## 2015-06-03 LAB — LIPID PANEL
CHOL/HDL RATIO: 4.1 ratio
Cholesterol: 144 mg/dL (ref 0–200)
HDL: 35 mg/dL — AB (ref >40)
LDL CALC: 90 mg/dL (ref 0–99)
Triglycerides: 97 mg/dL (ref <150)
VLDL: 19 mg/dL (ref 0–40)

## 2015-06-03 LAB — TSH: TSH: 0.318 u[IU]/mL — AB (ref 0.350–4.500)

## 2015-06-03 LAB — PROTIME-INR
INR: 1.25 (ref 0.00–1.49)
Prothrombin Time: 15.9 seconds — ABNORMAL HIGH (ref 11.6–15.2)

## 2015-06-03 LAB — BRAIN NATRIURETIC PEPTIDE: B Natriuretic Peptide: 188.9 pg/mL — ABNORMAL HIGH (ref 0.0–100.0)

## 2015-06-03 LAB — HEPARIN LEVEL (UNFRACTIONATED): Heparin Unfractionated: 0.23 IU/mL — ABNORMAL LOW (ref 0.30–0.70)

## 2015-06-03 LAB — T4, FREE: Free T4: 1.49 ng/dL — ABNORMAL HIGH (ref 0.61–1.12)

## 2015-06-03 LAB — TROPONIN I
Troponin I: 8.47 ng/mL (ref <0.031)
Troponin I: 7.01 ng/mL (ref <0.031)
Troponin I: 7 ng/mL (ref <0.031)

## 2015-06-03 LAB — ECHOCARDIOGRAM COMPLETE
HEIGHTINCHES: 61 in
WEIGHTICAEL: 2574.97 [oz_av]

## 2015-06-03 LAB — MRSA PCR SCREENING: MRSA BY PCR: NEGATIVE

## 2015-06-03 MED ORDER — ACETAMINOPHEN 325 MG PO TABS: 650.0000 mg | ORAL_TABLET | ORAL | Status: DC | PRN

## 2015-06-03 MED ORDER — ONDANSETRON HCL 4 MG/2ML IJ SOLN: 4.0000 mg | Freq: Four times a day (QID) | INTRAMUSCULAR | Status: DC | PRN

## 2015-06-03 MED ORDER — ATORVASTATIN CALCIUM 40 MG PO TABS
40.0000 mg | ORAL_TABLET | Freq: Every day | ORAL | Status: DC
  Administered 2015-06-03 – 2015-06-04 (×2): 40 mg via ORAL
  Filled 2015-06-03 (×3): qty 1

## 2015-06-03 MED ORDER — ASPIRIN EC 81 MG PO TBEC
81.0000 mg | DELAYED_RELEASE_TABLET | Freq: Every day | ORAL | Status: DC
  Administered 2015-06-03 – 2015-06-05 (×3): 81 mg via ORAL
  Filled 2015-06-03 (×3): qty 1

## 2015-06-03 MED ORDER — NITROGLYCERIN 0.4 MG SL SUBL: 0.4000 mg | SUBLINGUAL_TABLET | SUBLINGUAL | Status: DC | PRN

## 2015-06-03 MED ORDER — DEXTROSE 5 % IV SOLN
1.0000 g | INTRAVENOUS | Status: DC
  Administered 2015-06-03 – 2015-06-05 (×4): 1 g via INTRAVENOUS
  Filled 2015-06-03 (×5): qty 10

## 2015-06-03 MED ORDER — LEVOTHYROXINE SODIUM 100 MCG PO TABS
125.0000 ug | ORAL_TABLET | Freq: Every day | ORAL | Status: DC
  Administered 2015-06-03 – 2015-06-05 (×3): 125 ug via ORAL
  Filled 2015-06-03 (×3): qty 1

## 2015-06-03 MED ORDER — METOPROLOL TARTRATE 12.5 MG HALF TABLET
12.5000 mg | ORAL_TABLET | Freq: Two times a day (BID) | ORAL | Status: DC
  Administered 2015-06-03 – 2015-06-05 (×4): 12.5 mg via ORAL
  Filled 2015-06-03 (×6): qty 1

## 2015-06-03 MED ORDER — SODIUM CHLORIDE 0.9 % IV SOLN
INTRAVENOUS | Status: AC
  Administered 2015-06-03: 02:00:00 via INTRAVENOUS

## 2015-06-03 NOTE — Progress Notes (Signed)
Lab called with positive blood cultures from this morning. April Hodges has Gram negative rods.  Results text paged to [place], [place].

## 2015-06-03 NOTE — Progress Notes (Signed)
ANTICOAGULATION CONSULT NOTE - Follow Up Consult  Pharmacy Consult for heparin Indication: chest pain/ACS  No Known Allergies  Patient Measurements: Height: 5\' 1"  (154.9 cm) Weight: 160 lb 15 oz (73 kg) IBW/kg (Calculated) : 47.8 Heparin Dosing Weight: 65kg  Vital Signs: Temp: 98.7 F (37.1 C) (03/23 2153) Temp Source: Oral (03/23 2153) BP: 93/50 mmHg (03/24 0600) Pulse Rate: 57 (03/24 0940)  Labs:  Recent Labs  06/02/15 2216 06/02/15 2226 06/03/15 0430 06/03/15 0719  HGB 12.8 12.2  --  11.4*  HCT 37.8 36.0  --  34.7*  PLT 142*  --   --  138*  LABPROT  --   --   --  15.9*  INR  --   --   --  1.25  HEPARINUNFRC  --   --   --  0.23*  CREATININE 1.91* 1.90*  --  1.57*  TROPONINI  --   --  8.47* 7.01*    Estimated Creatinine Clearance: 23.1 mL/min (by C-G formula based on Cr of 1.57).   Medical History: Past Medical History  Diagnosis Date  . Hypothyroidism   . Hypertension   . GERD (gastroesophageal reflux disease)   . Arthritis   . Sinus bradycardia     states rates in 40's and low 50's are normal for her  . Pneumonia     as a child  . Headache     in younger years  . Cancer ([place])     skin cancer  . PONV (postoperative nausea and vomiting)   . Demand ischemia ([place]) 06/03/2015  . Acute on chronic renal failure ([facility]) 06/03/2015  . Essential hypertension 06/03/2015  . UTI (urinary tract infection) 06/03/2015     Assessment: 82yo female c/o 3d h/o N/V and generalized weakness, called EMS who did ECG and activated code STEMI that was subsequently canceled, initial istat troponin elevated to 7.4.  Heparin drip stared 900 uts/hr HL 0.23 slightly low.  CBC stable, no bleeding.    Goal of Therapy:  Heparin level 0.3-0.7 units/ml Monitor platelets by anticoagulation protocol: Yes   Plan:  Increase heparin 1000 uts/hr Check HL in 6hr Daily CBC, HL  Bonnita Nasuti Pharm.D. CPP, BCPS Clinical Pharmacist [phone] 06/03/2015 9:43 AM

## 2015-06-03 NOTE — Progress Notes (Addendum)
PATIENT ID:  April Hodges is an 82 year old female with hypertension, hypothyroidism, and gastroesophageal reflux disease who presents from [place] with nausea and vomiting and was found to have elevated cardiac enzymes.    INTERVAL HISTORY: nausea and vomiting have resolved.  No events overnight.   SUBJECTIVE:  April Hodges is feeling better.  She denies chest pain or shortness of breath.  She notes that many residents where she lives had GI illness.    PHYSICAL EXAM Filed Vitals:   06/03/15 0300 06/03/15 0401 06/03/15 0500 06/03/15 0600  BP: 81/42 87/47 119/99 93/50  Pulse: 57 52 63 51  Temp:      TempSrc:      Resp: 19 18 31 18   Height:      Weight:      SpO2: 94% 96% 99% 96%   General:  Well-appearing.  No acute distress.  Neck:  No JVD Lungs:  CTAB.  No crackles, rhonchi or wheezes.  Heart:  Regular rate and rhythm. No murmurs, rubs or gallops. Abdomen:  Soft, nontender, nondistended. Active BS.   Extremities:  WWP.  No edema .   LABS: Lab Results  Component Value Date   TROPONINI 7.01* 06/03/2015   Results for orders placed or performed during the hospital encounter of 06/02/15 (from the past 24 hour(s))  CBC     Status: Abnormal   Collection Time: 06/02/15 10:16 PM  Result Value Ref Range   WBC 10.7 (H) 4.0 - 10.5 K/uL   RBC 4.35 3.87 - 5.11 MIL/uL   Hemoglobin 12.8 12.0 - 15.0 g/dL   HCT 37.8 36.0 - 46.0 %   MCV 86.9 78.0 - 100.0 fL   MCH 29.4 26.0 - 34.0 pg   MCHC 33.9 30.0 - 36.0 g/dL   RDW 14.1 11.5 - 15.5 %   Platelets 142 (L) 150 - 400 K/uL  Comprehensive metabolic panel     Status: Abnormal   Collection Time: 06/02/15 10:16 PM  Result Value Ref Range   Sodium 134 (L) 135 - 145 mmol/L   Potassium 3.7 3.5 - 5.1 mmol/L   Chloride 99 (L) 101 - 111 mmol/L   CO2 20 (L) 22 - 32 mmol/L   Glucose, Bld 156 (H) 65 - 99 mg/dL   BUN 40 (H) 6 - 20 mg/dL   Creatinine, Ser 1.91 (H) 0.44 - 1.00 mg/dL   Calcium 8.7 (L) 8.9 - 10.3 mg/dL   Total  Protein 7.1 6.5 - 8.1 g/dL   Albumin 3.3 (L) 3.5 - 5.0 g/dL   AST 30 15 - 41 U/L   ALT 15 14 - 54 U/L   Alkaline Phosphatase 43 38 - 126 U/L   Total Bilirubin 0.9 0.3 - 1.2 mg/dL   GFR calc non Af Amer 22 (L) >60 mL/min   GFR calc Af Amer 26 (L) >60 mL/min   Anion gap 15 5 - 15  I-stat troponin, ED     Status: Abnormal   Collection Time: 06/02/15 10:25 PM  Result Value Ref Range   Troponin i, poc 7.40 (HH) 0.00 - 0.08 ng/mL   Comment NOTIFIED PHYSICIAN    Comment 3          I-stat chem 8, ed     Status: Abnormal   Collection Time: 06/02/15 10:26 PM  Result Value Ref Range   Sodium 134 (L) 135 - 145 mmol/L   Potassium 3.6 3.5 - 5.1 mmol/L   Chloride 98 (L) 101 - 111 mmol/L  BUN 39 (H) 6 - 20 mg/dL   Creatinine, Ser 1.90 (H) 0.44 - 1.00 mg/dL   Glucose, Bld 152 (H) 65 - 99 mg/dL   Calcium, Ion 1.02 (L) 1.13 - 1.30 mmol/L   TCO2 21 0 - 100 mmol/L   Hemoglobin 12.2 12.0 - 15.0 g/dL   HCT 36.0 36.0 - 46.0 %  I-Stat CG4 Lactic Acid, ED     Status: None   Collection Time: 06/02/15 10:27 PM  Result Value Ref Range   Lactic Acid, Venous 1.41 0.5 - 2.0 mmol/L  MRSA PCR Screening     Status: None   Collection Time: 06/03/15  1:12 AM  Result Value Ref Range   MRSA by PCR NEGATIVE NEGATIVE  Urinalysis, Routine w reflex microscopic (not at [facility])     Status: Abnormal   Collection Time: 06/03/15  2:32 AM  Result Value Ref Range   Color, Urine YELLOW YELLOW   APPearance CLOUDY (A) CLEAR   Specific Gravity, Urine 1.015 1.005 - 1.030   pH 5.5 5.0 - 8.0   Glucose, UA NEGATIVE NEGATIVE mg/dL   Hgb urine dipstick SMALL (A) NEGATIVE   Bilirubin Urine NEGATIVE NEGATIVE   Ketones, ur NEGATIVE NEGATIVE mg/dL   Protein, ur 30 (A) NEGATIVE mg/dL   Nitrite NEGATIVE NEGATIVE   Leukocytes, UA MODERATE (A) NEGATIVE  Urine microscopic-add on     Status: Abnormal   Collection Time: 06/03/15  2:32 AM  Result Value Ref Range   Squamous Epithelial / LPF 6-30 (A) NONE SEEN   WBC, UA 6-30 0 - 5  WBC/hpf   RBC / HPF 0-5 0 - 5 RBC/hpf   Bacteria, UA MANY (A) NONE SEEN   Casts HYALINE CASTS (A) NEGATIVE  Influenza panel by PCR (type A & B, H1N1)     Status: None   Collection Time: 06/03/15  4:08 AM  Result Value Ref Range   Influenza A By PCR NEGATIVE NEGATIVE   Influenza B By PCR NEGATIVE NEGATIVE   H1N1 flu by pcr NOT DETECTED NOT DETECTED  TSH     Status: Abnormal   Collection Time: 06/03/15  4:30 AM  Result Value Ref Range   TSH 0.318 (L) 0.350 - 4.500 uIU/mL  T4, free     Status: Abnormal   Collection Time: 06/03/15  4:30 AM  Result Value Ref Range   Free T4 1.49 (H) 0.61 - 1.12 ng/dL  Troponin I     Status: Abnormal   Collection Time: 06/03/15  4:30 AM  Result Value Ref Range   Troponin I 8.47 (HH) <0.031 ng/mL  Brain natriuretic peptide     Status: Abnormal   Collection Time: 06/03/15  4:30 AM  Result Value Ref Range   B Natriuretic Peptide 188.9 (H) 0.0 - 100.0 pg/mL  Lactic acid, plasma     Status: None   Collection Time: 06/03/15  4:30 AM  Result Value Ref Range   Lactic Acid, Venous 0.9 0.5 - 2.0 mmol/L  Lipid panel     Status: Abnormal   Collection Time: 06/03/15  4:30 AM  Result Value Ref Range   Cholesterol 144 0 - 200 mg/dL   Triglycerides 97 <150 mg/dL   HDL 35 (L) >40 mg/dL   Total CHOL/HDL Ratio 4.1 RATIO   VLDL 19 0 - 40 mg/dL   LDL Cholesterol 90 0 - 99 mg/dL  Heparin level (unfractionated)     Status: Abnormal   Collection Time: 06/03/15  7:19 AM  Result Value Ref  Range   Heparin Unfractionated 0.23 (L) 0.30 - 0.70 IU/mL  Troponin I     Status: Abnormal   Collection Time: 06/03/15  7:19 AM  Result Value Ref Range   Troponin I 7.01 (HH) <0.031 ng/mL  Basic metabolic panel     Status: Abnormal   Collection Time: 06/03/15  7:19 AM  Result Value Ref Range   Sodium 137 135 - 145 mmol/L   Potassium 4.2 3.5 - 5.1 mmol/L   Chloride 104 101 - 111 mmol/L   CO2 24 22 - 32 mmol/L   Glucose, Bld 110 (H) 65 - 99 mg/dL   BUN 36 (H) 6 - 20 mg/dL    Creatinine, Ser 1.57 (H) 0.44 - 1.00 mg/dL   Calcium 8.2 (L) 8.9 - 10.3 mg/dL   GFR calc non Af Amer 29 (L) >60 mL/min   GFR calc Af Amer 33 (L) >60 mL/min   Anion gap 9 5 - 15  CBC     Status: Abnormal   Collection Time: 06/03/15  7:19 AM  Result Value Ref Range   WBC 7.7 4.0 - 10.5 K/uL   RBC 3.92 3.87 - 5.11 MIL/uL   Hemoglobin 11.4 (L) 12.0 - 15.0 g/dL   HCT 34.7 (L) 36.0 - 46.0 %   MCV 88.5 78.0 - 100.0 fL   MCH 29.1 26.0 - 34.0 pg   MCHC 32.9 30.0 - 36.0 g/dL   RDW 14.5 11.5 - 15.5 %   Platelets 138 (L) 150 - 400 K/uL  Protime-INR     Status: Abnormal   Collection Time: 06/03/15  7:19 AM  Result Value Ref Range   Prothrombin Time 15.9 (H) 11.6 - 15.2 seconds   INR 1.25 0.00 - 1.49  Lactic acid, plasma     Status: None   Collection Time: 06/03/15  7:19 AM  Result Value Ref Range   Lactic Acid, Venous 0.9 0.5 - 2.0 mmol/L    Intake/Output Summary (Last 24 hours) at 06/03/15 0921 Last data filed at 06/03/15 0600  Gross per 24 hour  Intake 1524.3 ml  Output    600 ml  Net  924.3 ml    EKG:  Sinus bradycardia. Rate 52 bpm. Right bundle branch block. First-degree AV block.  Unchanged from 06/10/14.   ASSESSMENT AND PLAN:  Active Problems:   ACS (acute coronary syndrome) (HCC)   # Nausea and vomiting: # UTI:  Symptoms have mostly resolved. It seems as though there was a GI illness going around her facility.  It also appears that she has a mild urinary tract infection.  She is artery received IV fluids in the emergency department and her lactate is trending down, though it was never elevated to begin with. She is currently receiving ceftriaxone day 1 of 10. We will switch to oral therapy when culture data becomes available.  # Demand ischemia: Ms. Montufar denies any chest pain or shortness of breath. She is very active and does not have any symptoms with activity. Therefore, I suspect that her elevated troponin is due to demand ischemia rather than acute coronary syndrome.  Therefore, we will not proceed with cardiac catheterization at this time. An echocardiogram is pending. If she has significant systolic dysfunction or wall motion abnormalities or she develops chest pain we will reconsider this plan. We will stop the heparin infusion at this time.  Continue aspirin, atorvastatin, and metoprolol. She will likely need outpatient stress testing once stable.   # Hypertension: Her blood pressure has been  low, likely due to intravascular volume depletion in the setting of nausea and vomiting. Her home antihypertensives have been held. He is currently only receiving metoprolol. This may need to be held as well due to hypotension.  # Acute on chronic renal failure: Ms. Steen creatinine was up to 1.9 on admission. This is improved to 1.6 with hydration. Her renal function is likely worse in the setting of intravascular volume depletion. We are holding her home diuretic and she has been receiving gentle IV fluids.  This is another reason we will not pursue cardiac catheterization at this time.  Time spent: 25 minutes-Greater than 50% of this time was spent in counseling, explanation of diagnosis, planning of further management, and coordination of care.    Allysha Tryon C. Oval Linsey, MD, [facility] 06/03/2015 9:21 AM

## 2015-06-03 NOTE — Progress Notes (Signed)
  Echocardiogram 2D Echocardiogram has been performed.  Darlina Sicilian M 06/03/2015, 12:42 PM

## 2015-06-03 NOTE — Care Management Note (Signed)
Case Management Note  Patient Details  Name: April Hodges MRN: [id number] Date of Birth: [birth year]  Subjective/Objective:   Adm w acute cor syndrome, elev troponins                 Action/Plan: from [place]   Expected Discharge Date:                  Expected Discharge Plan:  [place]  In-House Referral:  Clinical Social Work  Discharge planning Services     Post Acute Care Choice:    Choice offered to:     DME Arranged:    DME Agency:     HH Arranged:    [place] Agency:     Status of Service:     Medicare Important Message Given:    Date Medicare IM Given:    Medicare IM give by:    Date Additional Medicare IM Given:    Additional Medicare Important Message give by:     If discussed at [place] of Stay Meetings, dates discussed:    Additional Comments: ur review done  Lacretia Leigh, RN 06/03/2015, 7:42 AM

## 2015-06-04 DIAGNOSIS — A419 Sepsis, unspecified organism: Secondary | ICD-10-CM | POA: Diagnosis not present

## 2015-06-04 DIAGNOSIS — I10 Essential (primary) hypertension: Secondary | ICD-10-CM | POA: Diagnosis not present

## 2015-06-04 DIAGNOSIS — I129 Hypertensive chronic kidney disease with stage 1 through stage 4 chronic kidney disease, or unspecified chronic kidney disease: Secondary | ICD-10-CM | POA: Diagnosis not present

## 2015-06-04 DIAGNOSIS — I248 Other forms of acute ischemic heart disease: Secondary | ICD-10-CM | POA: Diagnosis not present

## 2015-06-04 DIAGNOSIS — R7881 Bacteremia: Secondary | ICD-10-CM

## 2015-06-04 DIAGNOSIS — N390 Urinary tract infection, site not specified: Secondary | ICD-10-CM | POA: Diagnosis not present

## 2015-06-04 DIAGNOSIS — N179 Acute kidney failure, unspecified: Secondary | ICD-10-CM | POA: Diagnosis not present

## 2015-06-04 LAB — HEMOGLOBIN A1C
HEMOGLOBIN A1C: 6 % — AB (ref 4.8–5.6)
MEAN PLASMA GLUCOSE: 126 mg/dL

## 2015-06-04 LAB — CBC
HEMATOCRIT: 35.1 % — AB (ref 36.0–46.0)
HEMOGLOBIN: 11.1 g/dL — AB (ref 12.0–15.0)
MCH: 28.1 pg (ref 26.0–34.0)
MCHC: 31.6 g/dL (ref 30.0–36.0)
MCV: 88.9 fL (ref 78.0–100.0)
Platelets: 147 10*3/uL — ABNORMAL LOW (ref 150–400)
RBC: 3.95 MIL/uL (ref 3.87–5.11)
RDW: 14.3 % (ref 11.5–15.5)
WBC: 6.2 10*3/uL (ref 4.0–10.5)

## 2015-06-04 NOTE — Progress Notes (Signed)
PATIENT ID:  Ms. Vanderzwaag is an 82 year old female with hypertension, hypothyroidism, and gastroesophageal reflux disease who presented from [place] with nausea and vomiting and was found to have elevated cardiac enzymes.    INTERVAL HISTORY: No events overnight  SUBJECTIVE:  Ms. Mey is feeling better.  She denies chest pain or shortness of breath.     PHYSICAL EXAM Filed Vitals:   06/03/15 1437 06/03/15 2032 06/04/15 0413 06/04/15 0949  BP:  112/57 130/43 111/43  Pulse:  65 58 57  Temp:  98.9 F (37.2 C) 98 F (36.7 C)   TempSrc:  Oral Oral   Resp:  17 18   Height: 5\' 1"  (1.549 m)     Weight: 75.2 kg (165 lb 12.6 oz)  75.161 kg (165 lb 11.2 oz)   SpO2:  100% 98%    General:  Well-appearing.  No acute distress.  Neck:  No JVD Lungs:  CTAB.  No crackles, rhonchi or wheezes.  Heart:  Regular rate and rhythm. No murmurs, rubs or gallops. Abdomen:  Soft, nontender, nondistended. Active BS.   Extremities:  WWP.  No edema .   LABS: Lab Results  Component Value Date   TROPONINI 7.00* 06/03/2015   Results for orders placed or performed during the hospital encounter of 06/02/15 (from the past 24 hour(s))  Troponin I     Status: Abnormal   Collection Time: 06/03/15  1:12 PM  Result Value Ref Range   Troponin I 7.00 (HH) <0.031 ng/mL  CBC     Status: Abnormal   Collection Time: 06/04/15  4:00 AM  Result Value Ref Range   WBC 6.2 4.0 - 10.5 K/uL   RBC 3.95 3.87 - 5.11 MIL/uL   Hemoglobin 11.1 (L) 12.0 - 15.0 g/dL   HCT 35.1 (L) 36.0 - 46.0 %   MCV 88.9 78.0 - 100.0 fL   MCH 28.1 26.0 - 34.0 pg   MCHC 31.6 30.0 - 36.0 g/dL   RDW 14.3 11.5 - 15.5 %   Platelets 147 (L) 150 - 400 K/uL    Intake/Output Summary (Last 24 hours) at 06/04/15 1142 Last data filed at 06/04/15 0900  Gross per 24 hour  Intake 1143.84 ml  Output    825 ml  Net 318.84 ml    Telemetry: Sinus rhythm, sinus bradycardia.  Echo 06/03/15: Study Conclusions  - Left ventricle: The  cavity size was normal. Systolic function was  normal. The estimated ejection fraction was in the range of 60%  to 65%. Wall motion was normal; there were no regional wall  motion abnormalities. Doppler parameters are consistent with  abnormal left ventricular relaxation (grade 1 diastolic  dysfunction).   ASSESSMENT AND PLAN:  Active Problems:   Demand ischemia ([place])   Acute on chronic renal failure (HCC)   Essential hypertension   UTI (urinary tract infection)   # Sepis from a urinary source: Ms.  Gallogly is doing much better on Ceftriaxone.  It does not appear that a urine culture was sent.  However, blood cultures are positive for GNRs. We will await speciation and sensitivities. She is hemodynamically stable.  # Demand ischemia: Ms. Mccallister denies any chest pain or shortness of breath. She is very active and does not have any symptoms with activity. Therefore, I suspect that her elevated troponin is due to demand ischemia rather than acute coronary syndrome. Therefore, we will not proceed with cardiac catheterization at this time. Echo revealed normal systolic function without any wall motion  abnormalities.  Continue aspirin, atorvastatin, and metoprolol. Given her degree of troponin elevation she will likely need outpatient stress testing once stable.   # Hypertension: Her blood pressure has been low, likely due to intravascular volume depletion in the setting of nausea and vomiting. Blood pressure is well-controlled on metoprolol alone. Her home amlodipine and Maxzide are being held.    # Acute on chronic renal failure: Ms. Shewell creatinine was up to 1.9 on admission. This is improved to 1.6 with hydration. Her renal function is likely worse in the setting of intravascular volume depletion. We are holding her home diuretic and she has been receiving gentle IV fluids.  We will check a basic metabolic panel tomorrow.   Time spent: 25 minutes-Greater than 50% of this time was spent  in counseling, explanation of diagnosis, planning of further management, and coordination of care.    Martika Egler C. Oval Linsey, MD, [facility] 06/04/2015 11:42 AM

## 2015-06-05 DIAGNOSIS — R7881 Bacteremia: Secondary | ICD-10-CM | POA: Diagnosis not present

## 2015-06-05 DIAGNOSIS — A419 Sepsis, unspecified organism: Secondary | ICD-10-CM | POA: Diagnosis not present

## 2015-06-05 DIAGNOSIS — N179 Acute kidney failure, unspecified: Secondary | ICD-10-CM | POA: Diagnosis not present

## 2015-06-05 DIAGNOSIS — I10 Essential (primary) hypertension: Secondary | ICD-10-CM | POA: Diagnosis not present

## 2015-06-05 DIAGNOSIS — N390 Urinary tract infection, site not specified: Secondary | ICD-10-CM | POA: Diagnosis not present

## 2015-06-05 DIAGNOSIS — I248 Other forms of acute ischemic heart disease: Secondary | ICD-10-CM | POA: Diagnosis not present

## 2015-06-05 LAB — BASIC METABOLIC PANEL
ANION GAP: 11 (ref 5–15)
BUN: 19 mg/dL (ref 6–20)
CHLORIDE: 109 mmol/L (ref 101–111)
CO2: 22 mmol/L (ref 22–32)
Calcium: 8.7 mg/dL — ABNORMAL LOW (ref 8.9–10.3)
Creatinine, Ser: 1.02 mg/dL — ABNORMAL HIGH (ref 0.44–1.00)
GFR calc non Af Amer: 48 mL/min — ABNORMAL LOW (ref >60)
GFR, EST AFRICAN AMERICAN: 56 mL/min — AB (ref >60)
Glucose, Bld: 105 mg/dL — ABNORMAL HIGH (ref 65–99)
Potassium: 4.2 mmol/L (ref 3.5–5.1)
Sodium: 142 mmol/L (ref 135–145)

## 2015-06-05 LAB — CBC
HEMATOCRIT: 35.7 % — AB (ref 36.0–46.0)
HEMOGLOBIN: 11.1 g/dL — AB (ref 12.0–15.0)
MCH: 27.9 pg (ref 26.0–34.0)
MCHC: 31.1 g/dL (ref 30.0–36.0)
MCV: 89.7 fL (ref 78.0–100.0)
Platelets: 142 10*3/uL — ABNORMAL LOW (ref 150–400)
RBC: 3.98 MIL/uL (ref 3.87–5.11)
RDW: 14.2 % (ref 11.5–15.5)
WBC: 6.2 10*3/uL (ref 4.0–10.5)

## 2015-06-05 MED ORDER — AMOXICILLIN-POT CLAVULANATE 500-125 MG PO TABS: 1.0000 | ORAL_TABLET | Freq: Two times a day (BID) | ORAL | Status: DC

## 2015-06-05 MED ORDER — AMOXICILLIN-POT CLAVULANATE 500-125 MG PO TABS
1.0000 | ORAL_TABLET | Freq: Two times a day (BID) | ORAL | Status: DC
Start: 2015-06-06 — End: 2015-06-05

## 2015-06-05 MED ORDER — ATORVASTATIN CALCIUM 40 MG PO TABS: 40.0000 mg | ORAL_TABLET | Freq: Every day | ORAL | Status: DC

## 2015-06-05 MED ORDER — METOPROLOL TARTRATE 25 MG PO TABS: 12.5000 mg | ORAL_TABLET | Freq: Two times a day (BID) | ORAL | Status: DC

## 2015-06-05 MED ORDER — LEVOTHYROXINE SODIUM 125 MCG PO TABS: 125.0000 ug | ORAL_TABLET | Freq: Every day | ORAL | Status: DC

## 2015-06-05 MED ORDER — NITROGLYCERIN 0.4 MG SL SUBL: 0.4000 mg | SUBLINGUAL_TABLET | SUBLINGUAL | Status: DC | PRN

## 2015-06-05 NOTE — Progress Notes (Signed)
Pt for d/c today after receiving iv abt. Home with daughter

## 2015-06-05 NOTE — Discharge Summary (Signed)
Discharge Summary    Patient ID: April Hodges,  MRN: [id number], DOB/AGE: July 05, 1925 82 y.o.  Admit date: 06/02/2015 Discharge date: 06/05/2015  Primary Care Provider: [facility] Primary Cardiologist: Dr. Oval Linsey ([place])  Discharge Diagnoses    Principal Problem:   Demand ischemia [facility]) Active Problems:   Acute on chronic renal failure [facility])   Essential hypertension   UTI (urinary tract infection)   Sepis from a urinary source   Hypertension   Abnormal thyroid function   Hypothyroidism  Allergies No Known Allergies  Diagnostic Studies/Procedures    Echo 06/03/15 LV EF: 60% - 65%  ------------------------------------------------------------------- Indications: Abnormal blood test 790.99. Elevated Troponin.  ------------------------------------------------------------------- History: PMH: GERD. Bradycardia. PMH: Skin Cancer.  ------------------------------------------------------------------- Study Conclusions  - Left ventricle: The cavity size was normal. Systolic function was  normal. The estimated ejection fraction was in the range of 60%  to 65%. Wall motion was normal; there were no regional wall  motion abnormalities. Doppler parameters are consistent with  abnormal left ventricular relaxation (grade 1 diastolic  dysfunction).  History of Present Illness      April Hodges is a 82 y.o. female who presented [facility]p [facility] 06/02/15 for evaluation of nausea and vomiting. She has hx of HTN, hypothyrodism, GERD with no prior MI or known CAD. She states after dinner at her independent living facility on Monday night, she had acute onset of vomiting. She had several episodes of Monday night into Tuesday. This was associated with fevers, chills, and night sweats. She denied any chest pain or shortness of breath. She had decreased po intake following days but N/V improved. She was evalauted by RN at facility ue to patient not sounding well  per son over the phone. EMS was called and she was brought to the ED for further evaluation. She was initially called a STEMI. This was discussed with on call internationalist and STEMI was cancelled after review. She was evaluated in ED and found to have elevated troponin of 7.4. She is comfortable with son at bedside with chest pain or shortness of breath and hemodynamically stable.  Previous diagnostic testing for coronary artery disease includes: none. Previous history of cardiac disease includes None. Coronary artery disease risk factors include: advanced age (older than 52 for men, 32 for women), hypertension and obesity (BMI >= 30 kg/m2).  Patient denies history of angina, arrhythmia, cardiomyopathy, coronary artery disease, ischemic heart disease, previous M.I. and valvular disease.  The patient was admitted under cardiology service for further evaluation and management. Started on IV heparin.   Hospital Course     Consultants: None  # Sepis from a urinary source: April Hodges is doing much better on Ceftriaxone. Blood cultures show GNRs but there is no speciation or sensitivities. We will tx empirically switch her to Augmentin 500/125 mg q12h x 7 days. She will receive Ceftriaxone today before discharge.  # Demand ischemia: April Hodges denies any chest pain or shortness of breath. She is very active and does not have any symptoms with activity. Therefore, suspected that her elevated troponin is due to demand ischemia rather than acute coronary syndrome. Therefore, we will not proceed with cardiac catheterization at this time. Echo revealed normal systolic function without any wall motion abnormalities. Continue aspirin, atorvastatin, and metoprolol. Given her degree of troponin elevation she will likely need outpatient stress testing once stable.   # Hypertension: Blood pressure is well-controlled on metoprolol alone. Her home lisinopril has been held. She reported cough on this  medicine and scheduled a PCP appointment to request a change. We are also holding lasix. Volume status will be re-evaluated in clinic within 1 week.   # Acute on chronic renal failure: April Hodges creatinine was up to 1.9 on admission. This is improved to 1.2 on the day of discharge.   #Abnormal thyroid level: hx of hypothyroidism. Dose adjusted by Pharmacy.   The patient has been seen by Dr. Oval Linsey  today and deemed ready for discharge home. All follow-up appointments have been scheduled. Discharge medications are listed below.   The patient will need : TSH and Free T4 in 4-6 weeks; BMET and troponin level during TCM  Discharge Vitals Blood pressure 113/52, pulse 60, temperature 97.8 F (36.6 C), temperature source Oral, resp. rate 16, height 5\' 1"  (1.549 m), weight 164 lb 14.4 oz (74.798 kg), SpO2 97 %.  Filed Weights   06/03/15 1437 06/04/15 0413 06/05/15 0515  Weight: 165 lb 12.6 oz (75.2 kg) 165 lb 11.2 oz (75.161 kg) 164 lb 14.4 oz (74.798 kg)    Labs & Radiologic Studies     CBC  Recent Labs  06/04/15 0400 06/05/15 0344  WBC 6.2 6.2  HGB 11.1* 11.1*  HCT 35.1* 35.7*  MCV 88.9 89.7  PLT 147* [id number]*   Basic Metabolic Panel  Recent Labs  06/03/15 0719 06/05/15 0344  NA 137 142  K 4.2 4.2  CL 104 109  CO2 24 22  GLUCOSE 110* 105*  BUN 36* 19  CREATININE 1.57* 1.02*  CALCIUM 8.2* 8.7*   Liver Function Tests  Recent Labs  06/02/15 2216  AST 30  ALT 15  ALKPHOS 43  BILITOT 0.9  PROT 7.1  ALBUMIN 3.3*   No results for input(s): LIPASE, AMYLASE in the last 72 hours. Cardiac Enzymes  Recent Labs  06/03/15 0430 06/03/15 0719 06/03/15 1312  TROPONINI 8.47* 7.01* 7.00*   BNP 188.9  Hemoglobin A1C  Recent Labs  06/03/15 0430  HGBA1C 6.0*   Fasting Lipid Panel  Recent Labs  06/03/15 0430  CHOL 144  HDL 35*  LDLCALC 90  TRIG 97  CHOLHDL 4.1   Thyroid Function Tests  Recent Labs  06/03/15 0430  TSH 0.318*    Dg Chest Port 1  View  06/02/2015  CLINICAL DATA:  82 year old female with shortness of breath and cough EXAM: PORTABLE CHEST 1 VIEW COMPARISON:  None. FINDINGS: Single-view of the chest demonstrates emphysematous changes of the lungs. There is no focal consolidation, pleural effusion, or pneumothorax. The cardiac silhouette is within normal limits. No acute osseous pathology. IMPRESSION: No active disease. Electronically Signed   By: Anner Crete M.D.   On: 06/02/2015 22:15    Disposition   Pt is being discharged home today in good condition.  Follow-up Plans & Appointments    Follow-up Information    Follow up with Sharol Harness, MD.   Specialty:  Cardiology   Why:  office will call with appointment   Contact information:   [address] [place] [address] [phone]      Discharge Instructions    Diet - low sodium heart healthy    Complete by:  As directed      Increase activity slowly    Complete by:  As directed            Discharge Medications   Current Discharge Medication List    START taking these medications   Details  amoxicillin-clavulanate (AUGMENTIN) 500-125 MG tablet Take 1 tablet (  500 mg total) by mouth 2 (two) times daily. Qty: 14 tablet, Refills: 0    atorvastatin (LIPITOR) 40 MG tablet Take 1 tablet (40 mg total) by mouth daily at 6 PM. Qty: 30 tablet, Refills: 6    metoprolol tartrate (LOPRESSOR) 25 MG tablet Take 0.5 tablets (12.5 mg total) by mouth 2 (two) times daily. Qty: 30 tablet, Refills: 6    nitroGLYCERIN (NITROSTAT) 0.4 MG SL tablet Place 1 tablet (0.4 mg total) under the tongue every 5 (five) minutes x 3 doses as needed for chest pain. Qty: 5 tablet, Refills: 12      CONTINUE these medications which have CHANGED   Details  levothyroxine (SYNTHROID, LEVOTHROID) 125 MCG tablet Take 1 tablet (125 mcg total) by mouth daily. Qty: 30 tablet, Refills: 3      CONTINUE these medications which have NOT CHANGED   Details    acetaminophen (TYLENOL) 500 MG tablet Take 500 mg by mouth 2 (two) times daily as needed for mild pain.    MEGARED OMEGA-3 KRILL OIL 500 MG CAPS Take 500 mg by mouth daily.    omeprazole (PRILOSEC OTC) 20 MG tablet Take 10 mg by mouth daily.    aspirin EC 81 MG tablet Take 81 mg by mouth daily.    Multiple Vitamins-Minerals (MULTIVITAMIN PO) Take 1 tablet by mouth daily.      STOP taking these medications     furosemide (LASIX) 20 MG tablet      lisinopril (PRINIVIL,ZESTRIL) 5 MG tablet          Outstanding Labs/Studies   TSH and Free T4 in 4-6 weeks; BMET and troponin level during TCM  Duration of Discharge Encounter   Greater than 30 minutes including physician time.  Signed, Valeri Sula PA-C 06/05/2015, 11:08 AM

## 2015-06-05 NOTE — Progress Notes (Signed)
PATIENT ID:  April Hodges is an 82 year old female with hypertension, hypothyroidism, and gastroesophageal reflux disease who presented from [place] with nausea and vomiting and was found to have elevated cardiac enzymes.    INTERVAL HISTORY: No events overnight  SUBJECTIVE:  April Hodges is feeling better.  She denies chest pain or shortness of breath.     PHYSICAL EXAM Filed Vitals:   06/04/15 1210 06/04/15 2028 06/05/15 0515 06/05/15 0907  BP: 118/52 117/48 113/54 113/52  Pulse: 50 59 53 60  Temp: 98.2 F (36.8 C) 97.8 F (36.6 C) 97.8 F (36.6 C)   TempSrc: Oral Oral Oral   Resp: 18 16 16    Height:      Weight:   74.798 kg (164 lb 14.4 oz)   SpO2: 98% 99% 97%    General:  Well-appearing.  No acute distress.  Neck:  No JVD Lungs:  CTAB.  No crackles, rhonchi or wheezes.  Heart:  Regular rate and rhythm. No murmurs, rubs or gallops. Abdomen:  Soft, nontender, nondistended. Active BS.   Extremities:  WWP.  No edema .   LABS: Lab Results  Component Value Date   TROPONINI 7.00* 06/03/2015   Results for orders placed or performed during the hospital encounter of 06/02/15 (from the past 24 hour(s))  CBC     Status: Abnormal   Collection Time: 06/05/15  3:44 AM  Result Value Ref Range   WBC 6.2 4.0 - 10.5 K/uL   RBC 3.98 3.87 - 5.11 MIL/uL   Hemoglobin 11.1 (L) 12.0 - 15.0 g/dL   HCT 35.7 (L) 36.0 - 46.0 %   MCV 89.7 78.0 - 100.0 fL   MCH 27.9 26.0 - 34.0 pg   MCHC 31.1 30.0 - 36.0 g/dL   RDW 14.2 11.5 - 15.5 %   Platelets 142 (L) 150 - 400 K/uL  Basic metabolic panel     Status: Abnormal   Collection Time: 06/05/15  3:44 AM  Result Value Ref Range   Sodium 142 135 - 145 mmol/L   Potassium 4.2 3.5 - 5.1 mmol/L   Chloride 109 101 - 111 mmol/L   CO2 22 22 - 32 mmol/L   Glucose, Bld 105 (H) 65 - 99 mg/dL   BUN 19 6 - 20 mg/dL   Creatinine, Ser 1.02 (H) 0.44 - 1.00 mg/dL   Calcium 8.7 (L) 8.9 - 10.3 mg/dL   GFR calc non Af Amer 48 (L) >60 mL/min   GFR calc Af Amer 56 (L) >60 mL/min   Anion gap 11 5 - 15    Intake/Output Summary (Last 24 hours) at 06/05/15 1051 Last data filed at 06/05/15 0900  Gross per 24 hour  Intake    750 ml  Output    900 ml  Net   -150 ml    Telemetry: Sinus rhythm, sinus bradycardia.  Echo 06/03/15: Study Conclusions  - Left ventricle: The cavity size was normal. Systolic function was  normal. The estimated ejection fraction was in the range of 60%  to 65%. Wall motion was normal; there were no regional wall  motion abnormalities. Doppler parameters are consistent with  abnormal left ventricular relaxation (grade 1 diastolic  dysfunction).   ASSESSMENT AND PLAN:  Active Problems:   Demand ischemia ([place])   Acute on chronic renal failure (HCC)   Essential hypertension   UTI (urinary tract infection)   # Sepis from a urinary source: Ms.  Hodges is doing much better on Ceftriaxone.  Blood  cultures show GNRs but there is no speciation or sensitivities.  We will empirically switch her to Augmentin 500/125 mg q12h x 7 days. She will receive Ceftriaxone today before discharge.  # Demand ischemia: [name] denies any chest pain or shortness of breath. She is very active and does not have any symptoms with activity. Therefore, I suspect that her elevated troponin is due to demand ischemia rather than acute coronary syndrome. Therefore, we will not proceed with cardiac catheterization at this time. Echo revealed normal systolic function without any wall motion abnormalities.  Continue aspirin, atorvastatin, and metoprolol. Given her degree of troponin elevation she will likely need outpatient stress testing once stable.   # Hypertension:  Blood pressure is well-controlled on metoprolol alone. Her home lisinopril has been held.  She reported cough on this medicine and scheduled a PCP appointment to request a change.  We are also holding lasix.  Volume status will be re-evaluated in clinic within 1 week.     # Acute on chronic renal failure: April Hodges creatinine was up to 1.9 on admission. This is improved to 1.2 on the day of discharge.   April Proffit C. Oval Linsey, MD, [facility] 06/05/2015 10:51 AM

## 2015-06-06 LAB — CULTURE, BLOOD (ROUTINE X 2)

## 2015-06-07 ENCOUNTER — Telehealth: Payer: Self-pay | Admitting: Cardiovascular Disease

## 2015-06-07 NOTE — Telephone Encounter (Signed)
TCM per Vin 4/5 @ 1030am w/ Gaspar Bidding @ [facility]

## 2015-06-08 NOTE — Telephone Encounter (Signed)
Leave message to call back

## 2015-06-09 NOTE — Telephone Encounter (Signed)
TOC attempt #2 - phone busy  Encounter closed.

## 2015-06-15 ENCOUNTER — Ambulatory Visit (INDEPENDENT_AMBULATORY_CARE_PROVIDER_SITE_OTHER): Payer: PPO | Admitting: Physician Assistant

## 2015-06-15 ENCOUNTER — Encounter: Payer: Self-pay | Admitting: Physician Assistant

## 2015-06-15 VITALS — BP 120/78 | HR 52 | Ht 61.0 in | Wt 163.0 lb

## 2015-06-15 DIAGNOSIS — I248 Other forms of acute ischemic heart disease: Secondary | ICD-10-CM

## 2015-06-15 DIAGNOSIS — I10 Essential (primary) hypertension: Secondary | ICD-10-CM

## 2015-06-15 NOTE — Progress Notes (Signed)
Patient ID: April Hodges, female   DOB: [birth year], 82 y.o.   MRN: [id number]    Date:  06/15/2015   ID:  April Hodges, DOB 16-Jul-1925, MRN [id number]  PCP:  April Argyle, MD  Primary Cardiologist:  April Hodges  Chief Complaint  Patient presents with  . Follow-up    Asymptomatic     History of Present Illness: April Hodges is a 82 y.o. female who presented tp [facility] ED 06/02/15 for evaluation of nausea and vomiting. She has hx of HTN, hypothyrodism, GERD with no prior MI or known CAD. She states after dinner at her independent living facility on Monday night, she had acute onset of vomiting. She had several episodes of Monday night into Tuesday. This was associated with fevers, chills, and night sweats. She denied any chest pain or shortness of breath. She had decreased po intake following days but N/V improved. She was evalauted by RN at facility ue to patient not sounding well per son over the phone. EMS was called and she was brought to the ED for further evaluation. She was initially called a STEMI. This was discussed with on call internationalist and STEMI was cancelled after review. She was evaluated in ED and found to have elevated troponin of 7.4. Troponin elevation was thought to be related to demand ischemia in the setting of sepsis from urinary source. She was initially put on ceftriaxone however, blood culture showed gram-negative rods with no speciation or sensitivities. She was switched to Augmentin for 7 days.  She had an echocardiogram which revealed normal systolic function without any wall motion abnormalities. His continued on aspirin, statin and metoprolol. Will controlled. She had a crit of acute on chronic renal failure and her creatinine improved to 1.2 the day of discharge.  Patient presents for posthospital follow-up. She feels she is getting stronger each day however, she still feels somewhat tired.  She has finished  the Augmentin.  Her TSH in the hospital was mildly  low and her T4 was mildly high.  She says Dr. Felipa Hodges generally checks her labs.    The patient currently denies nausea, vomiting, fever, chest pain, shortness of breath, orthopnea, dizziness, PND, cough, congestion, abdominal pain, hematochezia, melena, lower extremity edema, claudication.  Wt Readings from Last 3 Encounters:  06/15/15 163 lb (73.936 kg)  06/05/15 164 lb 14.4 oz (74.798 kg)  07/31/14 160 lb (72.576 kg)     Past Medical History  Diagnosis Date  . Hypothyroidism   . Hypertension   . GERD (gastroesophageal reflux disease)   . Arthritis   . Sinus bradycardia     states rates in 40's and low 50's are normal for her  . Pneumonia     as a child  . Headache     in younger years  . Cancer (April Hodges)     skin cancer  . PONV (postoperative nausea and vomiting)   . Demand ischemia (April Hodges) 06/03/2015  . Acute on chronic renal failure (April Hodges) 06/03/2015  . Essential hypertension 06/03/2015  . UTI (urinary tract infection) 06/03/2015    Current Outpatient Prescriptions  Medication Sig Dispense Refill  . acetaminophen (TYLENOL) 500 MG tablet Take 500 mg by mouth 2 (two) times daily as needed for mild pain.    Marland Kitchen aspirin EC 81 MG tablet Take 81 mg by mouth daily.    Marland Kitchen atorvastatin (LIPITOR) 40 MG tablet Take 1 tablet (40 mg total) by mouth daily at 6 PM. 30 tablet 6  . bifidobacterium infantis (ALIGN) capsule Take  1 capsule by mouth daily.    Marland Kitchen levothyroxine (SYNTHROID, LEVOTHROID) 125 MCG tablet Take 1 tablet (125 mcg total) by mouth daily. 30 tablet 3  . MEGARED OMEGA-3 KRILL OIL 500 MG CAPS Take 500 mg by mouth daily.    . metoprolol tartrate (LOPRESSOR) 25 MG tablet Take 0.5 tablets (12.5 mg total) by mouth 2 (two) times daily. 30 tablet 6  . Multiple Vitamins-Minerals (MULTIVITAMIN PO) Take 1 tablet by mouth daily.    . nitroGLYCERIN (NITROSTAT) 0.4 MG SL tablet Place 1 tablet (0.4 mg total) under the tongue every 5 (five) minutes x 3 doses as needed for chest pain. 5 tablet 12  .  omeprazole (PRILOSEC OTC) 20 MG tablet Take 10 mg by mouth daily.     No current facility-administered medications for this visit.    Allergies:   No Known Allergies  Social History:  The patient  reports that she has never smoked. She has never used smokeless tobacco. She reports that she drinks alcohol. She reports that she does not use illicit drugs.   Family history:   Family History  Problem Relation Age of Onset  . Congestive Heart Failure Mother   . Heart attack Father     ROS:  Please see the history of present illness.  All other systems reviewed and negative.   PHYSICAL EXAM: VS:  BP 120/78 mmHg  Pulse 52  Ht 5\' 1"  (1.549 m)  Wt 163 lb (73.936 kg)  BMI 30.81 kg/m2 Well nourished, well developed, in no acute distress HEENT: Pupils are equal round react to light accommodation extraocular movements are intact.  Neck: no JVDNo cervical lymphadenopathy.  She does have a right-sided carotid bruit Cardiac: Regular rate and rhythm without murmurs rubs or gallops. Lungs:  clear to auscultation bilaterally, no wheezing, rhonchi or rales Abd: soft, nontender, positive bowel sounds all quadrants, no hepatosplenomegaly Ext: no lower extremity edema.  2+ radial and dorsalis pedis pulses. Skin: warm and dry Neuro:  Grossly normal   ASSESSMENT AND PLAN:  Problem List Items Addressed This Visit    Essential hypertension - Primary   Demand ischemia [facility])     April Hodges appears to be doing well. She feels she is getting stronger each day since her discharge. She's completed the antibiotics. Her echocardiogram was normal with the exception of grade 1 diastolic dysfunction.  She does have a right-sided carotid bruit.  Is not particularly loud however this may need evaluation in the future with carotid Dopplers.  We talked about some potential symptoms. Recommend rechecking TSH and T4 in 4-6 weeks.  We'll have her follow-up in 3 months with Dr. Oval Hodges.

## 2015-06-15 NOTE — Patient Instructions (Signed)
Medication Instructions:  Your physician recommends that you continue on your current medications as directed. Please refer to the Current Medication list given to you today.   Labwork: None ordered  Testing/Procedures: None ordered  Follow-Up: Your physician recommends that you schedule a follow-up appointment in: 3 months with Dr.Bradford   Any Other Special Instructions Will Be Listed Below (If Applicable).     If you need a refill on your cardiac medications before your next appointment, please call your pharmacy.

## 2015-06-29 ENCOUNTER — Telehealth: Payer: Self-pay | Admitting: Cardiovascular Disease

## 2015-06-29 NOTE — Telephone Encounter (Signed)
Returned call to pt. She is concerned that her diarrhea is coming from her medications metoprolol and atorvastatin.  She started these around 06/02/15 after being d/c'd from the hospital. She says it starts as severe abdominal cramping during the night with diarrhea about 6 hours after taking her evening dose of metoprolol and atorvastatin at 6pm. Her BP runs between 130-140's/60-70's.  No other complaints at this time.  Please advise. Will route to Dr. Oval Linsey for further evaluation.

## 2015-06-29 NOTE — Telephone Encounter (Signed)
Please call,concerning her Metoprolol and Atorvastatin. She have been having diarrhea all week,thats one of their side effects.. Please call to advise.

## 2015-07-01 NOTE — Telephone Encounter (Signed)
Left message to call back  

## 2015-07-01 NOTE — Telephone Encounter (Signed)
Lets try stopping atorvastatin to see if that helps.  Continue metoprolol.  Follow up in 2 weeks.  We can start rosuvastatin instead if the diarrhea improves.

## 2015-07-04 NOTE — Telephone Encounter (Signed)
F/u Phone call .. Mrs. Sollman is returning call about her medication, Please call   Thanks

## 2015-07-04 NOTE — Telephone Encounter (Signed)
Spoke with patient. She states she stopped atorvastatin one week ago. Her diarrhea improved. She has continued on metoprolol. Advised to continue holding statin until she sees Dr. Oval Linsey again - appt made for 5/9 @ 10am

## 2015-07-19 ENCOUNTER — Encounter: Payer: Self-pay | Admitting: Cardiovascular Disease

## 2015-07-19 ENCOUNTER — Ambulatory Visit (INDEPENDENT_AMBULATORY_CARE_PROVIDER_SITE_OTHER): Payer: PPO | Admitting: Cardiovascular Disease

## 2015-07-19 VITALS — BP 182/72 | HR 49 | Ht 60.0 in | Wt 164.6 lb

## 2015-07-19 DIAGNOSIS — I248 Other forms of acute ischemic heart disease: Secondary | ICD-10-CM | POA: Diagnosis not present

## 2015-07-19 DIAGNOSIS — I10 Essential (primary) hypertension: Secondary | ICD-10-CM | POA: Diagnosis not present

## 2015-07-19 DIAGNOSIS — E785 Hyperlipidemia, unspecified: Secondary | ICD-10-CM

## 2015-07-19 DIAGNOSIS — R5383 Other fatigue: Secondary | ICD-10-CM

## 2015-07-19 MED ORDER — NEBIVOLOL HCL 5 MG PO TABS: 5.0000 mg | ORAL_TABLET | Freq: Every day | ORAL | Status: DC

## 2015-07-19 MED ORDER — FUROSEMIDE 20 MG PO TABS: 20.0000 mg | ORAL_TABLET | Freq: Every morning | ORAL | Status: DC

## 2015-07-19 NOTE — Patient Instructions (Signed)
Medication Instructions:  STOP YOUR METOPROLOL   START BYSTOLIC 5 MG DAILY   Labwork: NONE  Testing/Procedures: NONE  Follow-Up: Your physician recommends that you schedule a follow-up appointment in: 3 MONTH OV  If you need a refill on your cardiac medications before your next appointment, please call your pharmacy.

## 2015-07-19 NOTE — Progress Notes (Signed)
Cardiology Office Note   Date:  07/19/2015   ID:  April Hodges, DOB [birth year], MRN [id number]  PCP:  Mathews Argyle, MD  Cardiologist:   Skeet Latch, MD   Chief Complaint  Patient presents with  . Follow-up    Med change from atorvastatin due to side affects  pt states she stared taking Lasix again because her feet and fingers swell     History of Present Illness: April Hodges is a 82 y.o. female with hypertension who presents for follow up after elevated troponin due to demand ischemia.  April Hodges was hospitalized 05/2014 for sepsis from a urinary source.  That hospitalization was complicated by elevated troponin of 7.4  She had no chest pain and it was felt to be demand ischemia.  Echo revealed normal systolic function and no wall motion abnormalities.  During that hospitalization she was noted to have mild hyperthyroidism.  She followed up with April Fuller, PA-C, on 06/15/15.  At that time she was bdoing well.  She was noted to have a R carotid bruit. Since that appointment she has noted diarrhea, which she attributed to either the atorvastatin or metoprolol that were started in the hospital.  She stopped the atorvastatin and her diarrhea improved.  April Hodges notes that she is feeling more tired than usual.  This started soon after leaving the hospital.  She continues to be active.  She walks her dog daily.  She walks 20-30 minutes daily.  However, she feels tired by 9:30-10 pm and takes a nap in the afternoon.  She never napped in the past.  She lives independely but gets meals at the [employer].  She likes to garden.  April Hodges not not Experienced any chest pain or shortness of breath. She denies lower extremity edema, orthopnea or PND. She also has not noted any palpitations, lightheadedness, or dizziness. She checks her blood pressure at home and notes that it has been running in the [id number] to [id number] systolic.   Past Medical History  Diagnosis Date  . Hypothyroidism   .  Hypertension   . GERD (gastroesophageal reflux disease)   . Arthritis   . Sinus bradycardia     states rates in 40's and low 50's are normal for her  . Pneumonia     as a child  . Headache     in younger years  . Cancer ([place])     skin cancer  . PONV (postoperative nausea and vomiting)   . Demand ischemia ([place]) 06/03/2015  . Acute on chronic renal failure ([place]) 06/03/2015  . Essential hypertension 06/03/2015  . UTI (urinary tract infection) 06/03/2015    Past Surgical History  Procedure Laterality Date  . Eye surgery Bilateral     cataracts removed and lens implant  . Tonsillectomy    . Vaginal hysterectomy    . Joint replacement Bilateral     knees  . Bilateral carpal tunnel release    . Colonoscopy    . Lumbar laminectomy/decompression microdiscectomy N/A 06/10/2014    Procedure: LUMBAR DECOMPRESSION L3 - L5 2 LEVELS;  Surgeon: Melina Schools, MD;  Location: [facility];  Service: Orthopedics;  Laterality: N/A;     Current Outpatient Prescriptions  Medication Sig Dispense Refill  . acetaminophen (TYLENOL) 500 MG tablet Take 500 mg by mouth 2 (two) times daily as needed for mild pain.    Marland Kitchen aspirin EC 81 MG tablet Take 81 mg by mouth daily.    . furosemide (LASIX)  20 MG tablet Take 1 tablet (20 mg total) by mouth every morning. 90 tablet 3  . levothyroxine (SYNTHROID, LEVOTHROID) 125 MCG tablet Take 1 tablet (125 mcg total) by mouth daily. 30 tablet 3  . MEGARED OMEGA-3 KRILL OIL 500 MG CAPS Take 500 mg by mouth daily.    . Multiple Vitamins-Minerals (MULTIVITAMIN PO) Take 1 tablet by mouth daily.    . nitroGLYCERIN (NITROSTAT) 0.4 MG SL tablet Place 1 tablet (0.4 mg total) under the tongue every 5 (five) minutes x 3 doses as needed for chest pain. 5 tablet 12  . omeprazole (PRILOSEC OTC) 20 MG tablet Take 10 mg by mouth daily.    . nebivolol (BYSTOLIC) 5 MG tablet Take 1 tablet (5 mg total) by mouth daily. 30 tablet 5   No current facility-administered medications for this visit.     Allergies:   Review of patient's allergies indicates no known allergies.    Social History:  The patient  reports that she has never smoked. She has never used smokeless tobacco. She reports that she drinks alcohol. She reports that she does not use illicit drugs.   Family History:  The patient's family history includes Congestive Heart Failure in her mother; Heart attack in her father.    ROS:  Please see the history of present illness.   Otherwise, review of systems are positive for none.   All other systems are reviewed and negative.    PHYSICAL EXAM: VS:  BP 182/72 mmHg  Pulse 49  Ht 5' (1.524 m)  Wt 74.662 kg (164 lb 9.6 oz)  BMI 32.15 kg/m2 , BMI Body mass index is 32.15 kg/(m^2). GENERAL:  Well appearing HEENT:  Pupils equal round and reactive, fundi not visualized, oral mucosa unremarkable NECK:  No jugular venous distention, waveform within normal limits, carotid upstroke brisk and symmetric, no bruits, no thyromegaly LYMPHATICS:  No cervical adenopathy LUNGS:  Clear to auscultation bilaterally HEART:  RRR.  PMI not displaced or sustained,S1 and S2 within normal limits, no S3, no S4, no clicks, no rubs, II/VI systolic murmur at the LUSB. ABD:  Flat, positive bowel sounds normal in frequency in pitch, no bruits, no rebound, no guarding, no midline pulsatile mass, no hepatomegaly, no splenomegaly EXT:  2 plus pulses throughout, no edema, no cyanosis no clubbing SKIN:  No rashes no nodules NEURO:  Cranial nerves II through XII grossly intact, motor grossly intact throughout PSYCH:  Cognitively intact, oriented to person place and time    EKG:  EKG is not ordered today. The ekg ordered 06/04/15 demonstrates sinus rhythm rate 61 bpm. Incomplete RBBB.  Echo 06/03/15: Study Conclusions  - Left ventricle: The cavity size was normal. Systolic function was  normal. The estimated ejection fraction was in the range of 60%  to 65%. Wall motion was normal; there were no  regional wall  motion abnormalities. Doppler parameters are consistent with  abnormal left ventricular relaxation (grade 1 diastolic  dysfunction).   Recent Labs: 06/02/2015: ALT 15 06/03/2015: B Natriuretic Peptide 188.9*; TSH 0.318* 06/05/2015: BUN 19; Creatinine, Ser 1.02*; Hemoglobin 11.1*; Platelets 142*; Potassium 4.2; Sodium 142    Lipid Panel    Component Value Date/Time   CHOL 144 06/03/2015 0430   TRIG 97 06/03/2015 0430   HDL 35* 06/03/2015 0430   CHOLHDL 4.1 06/03/2015 0430   VLDL 19 06/03/2015 0430   LDLCALC 90 06/03/2015 0430      Wt Readings from Last 3 Encounters:  07/19/15 74.662 kg (164 lb 9.6  oz)  06/15/15 73.936 kg (163 lb)  06/05/15 74.798 kg (164 lb 14.4 oz)      ASSESSMENT AND PLAN  # Hypertension: BP is poorly-controlled and she has fatigue on metoprolol.  We will stop metoprolol and start Bystolic  5 mg by mouth daily. Continue lasix 20 mg po daily.   # Hyperlipidemia: Ms. Feliciano did not tolerate atorvastatin and has myalgias with rosuvastatin in the past.  Her lipids are well-controlled.  Continue krill oil.  # Demand ischemia: Ms. Farell remains asymptomatic.  We will not pursue any additional investigation at this time.  Continue aspirin and start Bystolic as above.  We will not add another statin given that her LDL is 90 and she has not tolerated atorvastatin or rosuvastatin.  Continue Omega 3.   Current medicines are reviewed at length with the patient today.  The patient does not have concerns regarding medicines.  The following changes have been made:  Stop metoprolol and start Bystolic.   Labs/ tests ordered today include:  No orders of the defined types were placed in this encounter.     Disposition:   FU with Layla Kesling C. Oval Linsey, MD, [facility] in 3 months  This note was written with the assistance of speech recognition software.  Please excuse any transcriptional errors.  Signed, Kashina Mecum C. Oval Linsey, MD, [facility]  07/19/2015 1:23 PM     [place]

## 2015-07-25 DIAGNOSIS — N183 Chronic kidney disease, stage 3 (moderate): Secondary | ICD-10-CM | POA: Diagnosis not present

## 2015-07-25 DIAGNOSIS — I129 Hypertensive chronic kidney disease with stage 1 through stage 4 chronic kidney disease, or unspecified chronic kidney disease: Secondary | ICD-10-CM | POA: Diagnosis not present

## 2015-09-20 DIAGNOSIS — R7302 Impaired glucose tolerance (oral): Secondary | ICD-10-CM | POA: Diagnosis not present

## 2015-09-20 DIAGNOSIS — K219 Gastro-esophageal reflux disease without esophagitis: Secondary | ICD-10-CM | POA: Diagnosis not present

## 2015-09-20 DIAGNOSIS — E785 Hyperlipidemia, unspecified: Secondary | ICD-10-CM | POA: Diagnosis not present

## 2015-09-20 DIAGNOSIS — N521 Erectile dysfunction due to diseases classified elsewhere: Secondary | ICD-10-CM | POA: Diagnosis not present

## 2015-09-20 DIAGNOSIS — E291 Testicular hypofunction: Secondary | ICD-10-CM | POA: Diagnosis not present

## 2015-09-20 DIAGNOSIS — R6882 Decreased libido: Secondary | ICD-10-CM | POA: Diagnosis not present

## 2015-09-20 DIAGNOSIS — Z0000 Encounter for general adult medical examination without abnormal findings: Secondary | ICD-10-CM | POA: Diagnosis not present

## 2015-09-20 DIAGNOSIS — I10 Essential (primary) hypertension: Secondary | ICD-10-CM | POA: Diagnosis not present

## 2015-09-20 DIAGNOSIS — Z125 Encounter for screening for malignant neoplasm of prostate: Secondary | ICD-10-CM | POA: Diagnosis not present

## 2015-09-30 ENCOUNTER — Telehealth: Payer: Self-pay | Admitting: Cardiovascular Disease

## 2015-09-30 DIAGNOSIS — Z79899 Other long term (current) drug therapy: Secondary | ICD-10-CM

## 2015-09-30 NOTE — Telephone Encounter (Signed)
Left message to call back  

## 2015-09-30 NOTE — Telephone Encounter (Signed)
Spoke with patient and she has been having heart rate in the 40's for about a month  Blood pressure running 140-150/40's-60 States she has always been very active and now just has no energy When she walks her dog she is feeling out of breath after going up hills Reviewed medications with patient  Will forward to Dr Oval Linsey for review

## 2015-09-30 NOTE — Telephone Encounter (Signed)
Stop Bystolic.  Start amlodipine 5 mg daily.  BP appointment with pharmacy in 1 week.

## 2015-09-30 NOTE — Telephone Encounter (Signed)
New message   Pt c/o medication issue:  1. Name of Medication: bystolic  2. How are you currently taking this medication (dosage and times per day)? 1 a day, 5mg   3. Are you having a reaction (difficulty breathing--STAT)? Slow pulse    4. What is your medication issue? No energy, pt states pulse rate is 40, 42, 43, normal pulse is 50, 60. Could this be caused by medication.   Please advise.

## 2015-10-04 NOTE — Telephone Encounter (Signed)
F/u Message   pt call returning to speak with RN Rip Harbour about previous conversation. Please call back to discuss

## 2015-10-04 NOTE — Telephone Encounter (Signed)
Spoke with patient she stated that she had been prescribed Amlodipine by Dr Felipa Eth and it caused a cough.  Left message for [place] at Dr Carlyle Lipa to call back to verify medication.

## 2015-10-05 NOTE — Telephone Encounter (Signed)
Spoke with [place] and patient was prescribed Amlodipine 2.5 mg daily and developed swelling so is was discontinued.   Will forward to Dr Oval Linsey for review

## 2015-10-07 ENCOUNTER — Encounter: Payer: Self-pay | Admitting: *Deleted

## 2015-10-07 MED ORDER — LISINOPRIL 20 MG PO TABS: 20.0000 mg | ORAL_TABLET | Freq: Every day | ORAL | 1 refills | Status: DC

## 2015-10-07 NOTE — Telephone Encounter (Signed)
Let's try lisinopril 20 mg daily.  Please schedule follow up with pharmacy next week.

## 2015-10-11 NOTE — Telephone Encounter (Signed)
Patient did not want to come for blood pressure clinic. She live at a facility where blood pressure can be monitored. Discussed with Dr Oval Linsey and ok to have done there and will get bmet in 1 week  Advised patient, verbalized understanding

## 2015-10-17 DIAGNOSIS — Z79899 Other long term (current) drug therapy: Secondary | ICD-10-CM | POA: Diagnosis not present

## 2015-10-17 LAB — BASIC METABOLIC PANEL
BUN: 35 mg/dL — ABNORMAL HIGH (ref 7–25)
CHLORIDE: 104 mmol/L (ref 98–110)
CO2: 21 mmol/L (ref 20–31)
Calcium: 9.2 mg/dL (ref 8.6–10.4)
Creat: 1.39 mg/dL — ABNORMAL HIGH (ref 0.60–0.88)
Glucose, Bld: 121 mg/dL — ABNORMAL HIGH (ref 65–99)
Potassium: 4.8 mmol/L (ref 3.5–5.3)
SODIUM: 138 mmol/L (ref 135–146)

## 2015-10-25 ENCOUNTER — Encounter (INDEPENDENT_AMBULATORY_CARE_PROVIDER_SITE_OTHER): Payer: Self-pay

## 2015-10-25 ENCOUNTER — Ambulatory Visit (INDEPENDENT_AMBULATORY_CARE_PROVIDER_SITE_OTHER): Payer: PPO | Admitting: Cardiovascular Disease

## 2015-10-25 ENCOUNTER — Encounter: Payer: Self-pay | Admitting: Cardiovascular Disease

## 2015-10-25 VITALS — BP 169/71 | HR 60 | Ht 60.0 in | Wt 167.6 lb

## 2015-10-25 DIAGNOSIS — R011 Cardiac murmur, unspecified: Secondary | ICD-10-CM | POA: Diagnosis not present

## 2015-10-25 DIAGNOSIS — R001 Bradycardia, unspecified: Secondary | ICD-10-CM | POA: Diagnosis not present

## 2015-10-25 DIAGNOSIS — I10 Essential (primary) hypertension: Secondary | ICD-10-CM | POA: Diagnosis not present

## 2015-10-25 NOTE — Patient Instructions (Signed)
Medication Instructions:  Your physician recommends that you continue on your current medications as directed. Please refer to the Current Medication list given to you today.  Labwork: none  Testing/Procedures: Your physician has recommended that you wear a holter monitor. Holter monitors are medical devices that record the heart's electrical activity. Doctors most often use these monitors to diagnose arrhythmias. Arrhythmias are problems with the speed or rhythm of the heartbeat. The monitor is a small, portable device. You can wear one while you do your normal daily activities. This is usually used to diagnose what is causing palpitations/syncope (passing out). 48 hours  [address]  Follow-Up: Your physician recommends that you schedule a follow-up appointment in: 2 weeks with PA/NP  If you need a refill on your cardiac medications before your next appointment, please call your pharmacy.

## 2015-10-25 NOTE — Progress Notes (Signed)
Cardiology Office Note   Date:  10/25/2015   ID:  April Hodges, DOB Jun 29, 1925, MRN [id number]  PCP:  April Argyle, MD  Cardiologist:   April Latch, MD   Chief Complaint  Patient presents with  . Follow-up    3 Months; cramping in legs at night. edema in ankles. Pt also states felt fatigue yesterday 10/24/2015, along wiith pulse ranging in the 40's      History of Present Illness: April Hodges is a 82 y.o. female with hypertension who presents for follow up after elevated troponin due to demand ischemia.  Ms. April Hodges was hospitalized 05/2014 for sepsis from a urinary source.  That hospitalization was complicated by elevated troponin of 7.4  She had no chest pain and it was felt to be demand ischemia.  Echo 05/2015 revealed normal systolic function and no wall motion abnormalities.  During that hospitalization she was noted to have mild hyperthyroidism.  Atorvastatin was discontinued due to diarrhea.  She did not experience any chest pain and did not undergo an ischemia evaluation, as she was physically active and denied symptoms of ischemia.  She reported fatigue on metoprolol so this was switched to nebivolol.  However, she developed bradycardia so this was switched to amlodipine.  However she developed lower extremity edema on amlodipine 2.5 mg daily and lisinopril was started.  She is tolerating lisinopril well.  She brings a log of her blood pressures that shows it has ranged from 123-155/70-79.  Her heart rate has ranged from 47-56.  Yesterday she had an episode of feeling poorly after lunch and noted that her heart rate was irregular and in the 40s.  She rested for a while and then felt better after dinner.  She has a longstanding history of bradycardia but doesn't remember her heart rate being in the 40s.  She did not note any chest pain, shortness of breath, lower extremity edema, orhtopnea or PND.  She has been limiting her physical activity because she is afraid that her heart  rate will get low and she won't feel well again.   Review echo AS murmur with radiation to carotid   Past Medical History:  Diagnosis Date  . Acute on chronic renal failure ([place]) 06/03/2015  . Arthritis   . Cancer ([place])    skin cancer  . Demand ischemia (April Hodges) 06/03/2015  . Essential hypertension 06/03/2015  . GERD (gastroesophageal reflux disease)   . Headache    in younger years  . Hypertension   . Hypothyroidism   . Pneumonia    as a child  . PONV (postoperative nausea and vomiting)   . Sinus bradycardia    states rates in 40's and low 50's are normal for her  . UTI (urinary tract infection) 06/03/2015    Past Surgical History:  Procedure Laterality Date  . BILATERAL CARPAL TUNNEL RELEASE    . COLONOSCOPY    . EYE SURGERY Bilateral    cataracts removed and lens implant  . JOINT REPLACEMENT Bilateral    knees  . LUMBAR LAMINECTOMY/DECOMPRESSION MICRODISCECTOMY N/A 06/10/2014   Procedure: LUMBAR DECOMPRESSION L3 - L5 2 LEVELS;  Surgeon: Melina Schools, MD;  Location: [facility];  Service: Orthopedics;  Laterality: N/A;  . TONSILLECTOMY    . VAGINAL HYSTERECTOMY       Current Outpatient Prescriptions  Medication Sig Dispense Refill  . acetaminophen (TYLENOL) 500 MG tablet Take 500 mg by mouth 2 (two) times daily as needed for mild pain.    Marland Kitchen aspirin  EC 81 MG tablet Take 81 mg by mouth daily.    . furosemide (LASIX) 20 MG tablet Take 1 tablet (20 mg total) by mouth every morning. 90 tablet 3  . levothyroxine (SYNTHROID, LEVOTHROID) 125 MCG tablet Take 1 tablet (125 mcg total) by mouth daily. 30 tablet 3  . lisinopril (PRINIVIL,ZESTRIL) 20 MG tablet Take 1 tablet (20 mg total) by mouth daily. 90 tablet 1  . MEGARED OMEGA-3 KRILL OIL 500 MG CAPS Take 500 mg by mouth daily.    . Multiple Vitamins-Minerals (MULTIVITAMIN PO) Take 1 tablet by mouth daily.    . nitroGLYCERIN (NITROSTAT) 0.4 MG SL tablet Place 1 tablet (0.4 mg total) under the tongue every 5 (five) minutes x 3 doses as  needed for chest pain. 5 tablet 12  . omeprazole (PRILOSEC OTC) 20 MG tablet Take 10 mg by mouth daily.     No current facility-administered medications for this visit.     Allergies:   Amlodipine and Bystolic [nebivolol hcl]    Social History:  The patient  reports that she has never smoked. She has never used smokeless tobacco. She reports that she drinks alcohol. She reports that she does not use drugs.   Family History:  The patient's family history includes Congestive Heart Failure in her mother; Heart attack in her father.    ROS:  Please see the history of present illness.   Otherwise, review of systems are positive for none.   All other systems are reviewed and negative.    PHYSICAL EXAM: VS:  BP (!) 169/71   Pulse 60   Ht 5' (1.524 m)   Wt 167 lb 9.6 oz (76 kg)   BMI 32.73 kg/m  , BMI Body mass index is 32.73 kg/m. GENERAL:  Well appearing HEENT:  Pupils equal round and reactive, fundi not visualized, oral mucosa unremarkable NECK:  No jugular venous distention, waveform within normal limits, carotid upstroke brisk and symmetric, no bruits, no thyromegaly LYMPHATICS:  No cervical adenopathy LUNGS:  Clear to auscultation bilaterally HEART:  RRR.  PMI not displaced or sustained,S1 and S2 within normal limits, no S3, no S4, no clicks, no rubs, II/VI systolic murmur at the LUSB with radiation to the R carotid. ABD:  Flat, positive bowel sounds normal in frequency in pitch, no bruits, no rebound, no guarding, no midline pulsatile mass, no hepatomegaly, no splenomegaly EXT:  2 plus pulses throughout, no edema, no cyanosis no clubbing.  R LE chronically largerthan L SKIN:  No rashes no nodules NEURO:  Cranial nerves II through XII grossly intact, motor grossly intact throughout PSYCH:  Cognitively intact, oriented to person place and time    EKG:  EKG is not ordered today. The ekg ordered 06/04/15 demonstrates sinus rhythm rate 61 bpm. Incomplete RBBB.  Echo 06/03/15: Study  Conclusions  - Left ventricle: The cavity size was normal. Systolic function was  normal. The estimated ejection fraction was in the range of 60%  to 65%. Wall motion was normal; there were no regional wall  motion abnormalities. Doppler parameters are consistent with  abnormal left ventricular relaxation (grade 1 diastolic  dysfunction).   Recent Labs: 06/02/2015: ALT 15 06/03/2015: B Natriuretic Peptide 188.9; TSH 0.318 06/05/2015: Hemoglobin 11.1; Platelets 142 10/17/2015: BUN 35; Creat 1.39; Potassium 4.8; Sodium 138    Lipid Panel    Component Value Date/Time   CHOL 144 06/03/2015 0430   TRIG 97 06/03/2015 0430   HDL 35 (L) 06/03/2015 0430   CHOLHDL 4.1 06/03/2015  0430   VLDL 19 06/03/2015 0430   LDLCALC 90 06/03/2015 0430      Wt Readings from Last 3 Encounters:  10/25/15 167 lb 9.6 oz (76 kg)  07/19/15 164 lb 9.6 oz (74.7 kg)  06/15/15 163 lb (73.9 kg)      ASSESSMENT AND PLAN  # Bradycardia: Rates are at least as low as 47 and she had symptoms at that time.  She also reported that her heart was irregular at the time.  We will obtain a 48 hour Holter to make sure she is not having atrial fibrillation and to document her bradycardia.  We discussed the fact that she will likely need a pacemaker.  She is not on any nodal agents.   # Murmur: Ms. Cerreta has a murmur on exam that sounds like aortic stenosis.  I reviewed her echo from 05/2015 and she does not have aortic stenosis.  # Hypertension: BP is poorly-controlled and she has fatigue on metoprolol.  We will stop metoprolol and start Bystolic  5 mg by mouth daily. Continue lasix 20 mg po daily.   # Hyperlipidemia: Ms. Sankovich did not tolerate atorvastatin and has myalgias with rosuvastatin in the past.  Her lipids are well-controlled.  Continue krill oil.  Current medicines are reviewed at length with the patient today.  The patient does not have concerns regarding medicines.  The following changes have been made:   None Labs/ tests ordered today include:   Orders Placed This Encounter  Procedures  . Holter monitor - 48 hour     Disposition:   FU with Jonella Redditt C. Oval Linsey, MD, [facility] in 2 weeks.   This note was written with the assistance of speech recognition software.  Please excuse any transcriptional errors.  Signed, Geena Weinhold C. Oval Linsey, MD, [facility]  10/25/2015 11:20 AM    [place]

## 2015-11-01 ENCOUNTER — Ambulatory Visit (INDEPENDENT_AMBULATORY_CARE_PROVIDER_SITE_OTHER): Payer: PPO

## 2015-11-01 DIAGNOSIS — R001 Bradycardia, unspecified: Secondary | ICD-10-CM

## 2015-11-02 ENCOUNTER — Other Ambulatory Visit: Payer: Self-pay | Admitting: Physician Assistant

## 2015-11-03 NOTE — Telephone Encounter (Signed)
Please defer this to PCP

## 2015-11-09 ENCOUNTER — Ambulatory Visit (INDEPENDENT_AMBULATORY_CARE_PROVIDER_SITE_OTHER): Payer: Self-pay | Admitting: Nurse Practitioner

## 2015-11-09 ENCOUNTER — Encounter: Payer: Self-pay | Admitting: Nurse Practitioner

## 2015-11-09 VITALS — BP 128/74 | HR 57 | Ht 60.0 in | Wt 167.4 lb

## 2015-11-09 DIAGNOSIS — R001 Bradycardia, unspecified: Secondary | ICD-10-CM | POA: Insufficient documentation

## 2015-11-09 DIAGNOSIS — N183 Chronic kidney disease, stage 3 unspecified: Secondary | ICD-10-CM | POA: Insufficient documentation

## 2015-11-09 DIAGNOSIS — E039 Hypothyroidism, unspecified: Secondary | ICD-10-CM | POA: Insufficient documentation

## 2015-11-09 NOTE — Progress Notes (Signed)
Office Visit    Patient Name: April Hodges Date of Encounter: 11/09/2015  Primary Care Provider:  Mathews Argyle, MD Primary Cardiologist:  Berneice Gandy, MD   Chief Complaint    82 y/o ? with a h/o sepsis and elevated troponin in 05/2015 and baseline bradycardia, who presents for f/u.   Past Medical History    Past Medical History:  Diagnosis Date  . Acute on chronic renal failure ([employer]) 06/03/2015  . Arthritis   . Cancer ([place])    skin cancer  . CKD (chronic kidney disease), stage III   . Demand ischemia ([place])    a. 05/2015 in setting of sepsis w/ troponin peak of 8.47;  b. 05/2015 Echo: EF 60-65%, no rwma, Gr1 DD-->No ischemic eval undertaken.  . Essential hypertension 06/03/2015  . GERD (gastroesophageal reflux disease)   . Headache    in younger years  . Hypothyroidism   . Pneumonia    as a child  . PONV (postoperative nausea and vomiting)   . Sinus bradycardia    a. states rates in 40's and low 50's are normal for her-->No AVN blocking agents.  . Systolic murmur    a. 123XX123 No sigificant valvular dzs on echo.  . Urosepsis 06/03/2015   Past Surgical History:  Procedure Laterality Date  . BILATERAL CARPAL TUNNEL RELEASE    . COLONOSCOPY    . EYE SURGERY Bilateral    cataracts removed and lens implant  . JOINT REPLACEMENT Bilateral    knees  . LUMBAR LAMINECTOMY/DECOMPRESSION MICRODISCECTOMY N/A 06/10/2014   Procedure: LUMBAR DECOMPRESSION L3 - L5 2 LEVELS;  Surgeon: Melina Schools, MD;  Location: [facility];  Service: Orthopedics;  Laterality: N/A;  . TONSILLECTOMY    . VAGINAL HYSTERECTOMY      Allergies  Allergies  Allergen Reactions  . Amlodipine Swelling  . Bystolic [Nebivolol Hcl]     bradycardia    History of Present Illness    82 year old female with the above complex past medical history including an admission to the hospital in March 2017 with urosepsis at which time, her troponin was elevated and peaked at 8.47. She was asymptomatic from a  cardiac standpoint prior to admission. Echo showed normal LV function without wall motion abnormalities. Decision was made not to pursue an ischemic evaluation. She was initially placed on metoprolol but experienced fatigue and was subsequently placed on nebivolol. In that setting, she developed bradycardia and was switched to amlodipine, which caused lower extremity swelling and at last, she was placed on lisinopril. She has tolerated this. When she was seen roughly 2 weeks ago, she reported an episode of irregular heart rhythm with rates in the 40s. She is a long-standing history of bradycardia with rates typically in the 50s. Following her last visit, a 48 hour Holter was placed. She returns today to discuss these results. Unfortunately, these results have not come back to [place] yet. She says that she felt well during the 48-hour period that she wore the Holter. She has not been having any chest pain, dyspnea, palpitations, PND, orthopnea, dizziness, syncope, edema, or early satiety.  Home Medications    Prior to Admission medications   Medication Sig Start Date End Date Taking? Authorizing Provider  acetaminophen (TYLENOL) 500 MG tablet Take 500 mg by mouth 2 (two) times daily as needed for mild pain.   Yes Historical Provider, MD  aspirin EC 81 MG tablet Take 81 mg by mouth daily.   Yes Historical Provider, MD  furosemide (LASIX)  20 MG tablet Take 1 tablet (20 mg total) by mouth every morning. 07/19/15  Yes Skeet Latch, MD  levothyroxine (SYNTHROID, LEVOTHROID) 125 MCG tablet Take 1 tablet (125 mcg total) by mouth daily. 06/05/15  Yes Bhavinkumar Bhagat, PA  lisinopril (PRINIVIL,ZESTRIL) 20 MG tablet Take 1 tablet (20 mg total) by mouth daily. 10/07/15 01/05/16 Yes Skeet Latch, MD  MEGARED OMEGA-3 KRILL OIL 500 MG CAPS Take 500 mg by mouth daily.   Yes Historical Provider, MD  Multiple Vitamins-Minerals (MULTIVITAMIN PO) Take 1 tablet by mouth daily.   Yes Historical Provider, MD  nitroGLYCERIN  (NITROSTAT) 0.4 MG SL tablet Place 1 tablet (0.4 mg total) under the tongue every 5 (five) minutes x 3 doses as needed for chest pain. 06/05/15  Yes Bhavinkumar Bhagat, PA  omeprazole (PRILOSEC OTC) 20 MG tablet Take 10 mg by mouth daily.   Yes Historical Provider, MD    Review of Systems    Doing well as above.  She denies chest pain, palpitations, dyspnea, pnd, orthopnea, n, v, dizziness, syncope, edema, weight gain, or early satiety.  All other systems reviewed and are otherwise negative except as noted above.  Physical Exam    VS:  BP 128/74 (BP Location: Right Arm, Patient Position: Sitting, Cuff Size: Normal)   Pulse (!) 57   Ht 5' (1.524 m)   Wt 167 lb 6 oz (75.9 kg)   SpO2 96%   BMI 32.69 kg/m  , BMI Body mass index is 32.69 kg/m. GEN: Well nourished, well developed, in no acute distress.  HEENT: normal.  Neck: Supple, no JVD, carotid bruits, or masses. Cardiac: RRR, Soft systolic murmur at left upper sternal border, no rubs, or gallops. No clubbing, cyanosis, edema.  Radials/DP/PT 2+ and equal bilaterally.  Respiratory:  Respirations regular and unlabored, clear to auscultation bilaterally. GI: Soft, nontender, nondistended, BS + x 4. MS: no deformity or atrophy. Skin: warm and dry, no rash. Neuro:  Strength and sensation are intact. Psych: Normal affect.  Accessory Clinical Findings    48 hour Holter monitor pending  Assessment & Plan    1.  Bradycardia: Patient recently reported irregular heart rhythm with bradycardia in the 40s. A 48 hour Holter monitor was placed on August 22. She has since had this in however, we do not have any results published. We have called the monitoring company in an effort to get the results this morning. These have not yet arrived. [name] has been doing well and was asymptomatic during her time of wearing the Holter. I advised that we will call her with the results as soon as we get them.  2. Essential hypertension: Blood pressure stable  on lisinopril therapy.  3. Disposition: We will call patient as soon as we have Holter monitoring results. Follow-up with Dr. Oval Linsey in 2-3 months or sooner pending Holter.   Murray Hodgkins, NP 11/09/2015, 8:42 AM

## 2015-11-09 NOTE — Patient Instructions (Signed)
Medication Instructions:  Your physician recommends that you continue on your current medications as directed. Please refer to the Current Medication list given to you today.  Labwork: None Ordered  Testing/Procedures: None Ordered  Follow-Up: Your physician recommends that you schedule a follow-up appointment in: 2-3 months with DR [facility]   Any Other Special Instructions Will Be Listed Below (If Applicable).  NO CHARGE FOR THIS APPOINTMENT PER CHRIS BERGE, NP PLEASE REFUND COPAY.   If you need a refill on your cardiac medications before your next appointment, please call your pharmacy.

## 2015-11-10 ENCOUNTER — Telehealth: Payer: Self-pay | Admitting: *Deleted

## 2015-11-10 DIAGNOSIS — R001 Bradycardia, unspecified: Secondary | ICD-10-CM

## 2015-11-10 DIAGNOSIS — I493 Ventricular premature depolarization: Secondary | ICD-10-CM

## 2015-11-10 DIAGNOSIS — R748 Abnormal levels of other serum enzymes: Secondary | ICD-10-CM

## 2015-11-10 NOTE — Telephone Encounter (Signed)
-----   Message from Skeet Latch, MD sent at 11/09/2015  2:25 PM EDT ----- Monitor shows frequent early beats from both the top and bottom chambers of the heart.  These are not dangerous but can cause you to feel poorly.  Sometimes it occurs when the heart isn't getting enough blood flow.  Since her heart enzymes were elevated in the hospital and she is having these rhythms we should get a Lexiscan Myoview to evaluate for ischemia.  Schedule f/u with EP after stress to discuss her PVCs and bradycardia.

## 2015-11-10 NOTE — Telephone Encounter (Signed)
Advised patient of results and order for myoview/referral in Epic

## 2015-11-24 DIAGNOSIS — H35373 Puckering of macula, bilateral: Secondary | ICD-10-CM | POA: Diagnosis not present

## 2015-11-24 DIAGNOSIS — H35033 Hypertensive retinopathy, bilateral: Secondary | ICD-10-CM | POA: Diagnosis not present

## 2015-11-24 DIAGNOSIS — Z961 Presence of intraocular lens: Secondary | ICD-10-CM | POA: Diagnosis not present

## 2015-11-24 DIAGNOSIS — H3561 Retinal hemorrhage, right eye: Secondary | ICD-10-CM | POA: Diagnosis not present

## 2015-11-25 ENCOUNTER — Institutional Professional Consult (permissible substitution): Payer: PPO | Admitting: Internal Medicine

## 2015-11-29 ENCOUNTER — Encounter (INDEPENDENT_AMBULATORY_CARE_PROVIDER_SITE_OTHER): Payer: Self-pay

## 2015-11-29 ENCOUNTER — Encounter: Payer: Self-pay | Admitting: Internal Medicine

## 2015-11-29 ENCOUNTER — Ambulatory Visit (INDEPENDENT_AMBULATORY_CARE_PROVIDER_SITE_OTHER): Payer: PPO | Admitting: Internal Medicine

## 2015-11-29 VITALS — BP 144/64 | HR 57 | Ht 60.0 in | Wt 176.0 lb

## 2015-11-29 DIAGNOSIS — I495 Sick sinus syndrome: Secondary | ICD-10-CM

## 2015-11-29 NOTE — Patient Instructions (Addendum)
Medication Instructions:  Your physician recommends that you continue on your current medications as directed. Please refer to the Current Medication list given to you today.   Labwork: None ordered   Testing/Procedures: None ordered---Dr Lovena Le sent Dr Oval Linsey message in regards to further testing   Follow-Up: Your physician recommends that you schedule a follow-up appointment as needed   Any Other Special Instructions Will Be Listed Below (If Applicable).     If you need a refill on your cardiac medications before your next appointment, please call your pharmacy.

## 2015-11-29 NOTE — Progress Notes (Signed)
HPI April Hodges is referred today by Dr. Oval Linsey for evaluation of atrial and ventricular ectopy in the setting of mild sinus node dysfunction. 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 She has had a h/o bradycardia, unclear if symptomatic on no medical therapy. She wore a cardiac monitor which demonstrated sinus bradycardia but no prolong pauses and both atrial and ventricular ectopy but no sustained runs. She has minimal palpitations. She does note exertional dyspnea. No anginal symptoms. She has a long h/o severe leg cramps. She also note problems with peripheral edema for which she takes lasix. No syncope.  Allergies  Allergen Reactions  . Amlodipine Swelling    Coughing as well  . Bystolic [Nebivolol Hcl]     Bradycardia - HR in the 40s     Current Outpatient Prescriptions  Medication Sig Dispense Refill  . acetaminophen (TYLENOL) 500 MG tablet Take 500 mg by mouth 2 (two) times daily as needed for mild pain.    Marland Kitchen aspirin EC 81 MG tablet Take 81 mg by mouth daily.    . furosemide (LASIX) 20 MG tablet Take 1 tablet (20 mg total) by mouth every morning. 90 tablet 3  . levothyroxine (SYNTHROID, LEVOTHROID) 125 MCG tablet Take 1 tablet (125 mcg total) by mouth daily. 30 tablet 3  . lisinopril (PRINIVIL,ZESTRIL) 20 MG tablet Take 1 tablet (20 mg total) by mouth daily. 90 tablet 1  . MEGARED OMEGA-3 KRILL OIL 500 MG CAPS Take 500 mg by mouth daily.    . Multiple Vitamins-Minerals (MULTIVITAMIN PO) Take 1 tablet by mouth daily.    . nitroGLYCERIN (NITROSTAT) 0.4 MG SL tablet Place 1 tablet (0.4 mg total) under the tongue every 5 (five) minutes x 3 doses as needed for chest pain. 5 tablet 12  . omeprazole (PRILOSEC OTC) 20 MG tablet Take 10 mg by mouth daily.     No current facility-administered medications for this visit.      Past Medical History:  Diagnosis Date  . Acute on chronic renal failure ([place]) 06/03/2015  . Arthritis   . Cancer ([place])     skin cancer  . CKD (chronic kidney disease), stage III   . Demand ischemia ([place])    a. 05/2015 in setting of sepsis w/ troponin peak of 8.47;  b. 05/2015 Echo: EF 60-65%, no rwma, Gr1 DD-->No ischemic eval undertaken.  . Essential hypertension 06/03/2015  . GERD (gastroesophageal reflux disease)   . Headache    in younger years  . Hypothyroidism   . Pneumonia    as a child  . PONV (postoperative nausea and vomiting)   . Sinus bradycardia    a. states rates in 40's and low 50's are normal for her-->No AVN blocking agents.  . Systolic murmur    a. 123XX123 No sigificant valvular dzs on echo.  . Urosepsis 06/03/2015    ROS:   All systems reviewed and negative except as noted in the HPI.   Past Surgical History:  Procedure Laterality Date  . BILATERAL CARPAL TUNNEL RELEASE    . COLONOSCOPY    . EYE SURGERY Bilateral    cataracts removed and lens implant  . JOINT REPLACEMENT Bilateral    knees  . LUMBAR LAMINECTOMY/DECOMPRESSION MICRODISCECTOMY N/A 06/10/2014   Procedure: LUMBAR DECOMPRESSION L3 - L5 2 LEVELS;  Surgeon: Melina Schools, MD;  Location: [facility];  Service: Orthopedics;  Laterality: N/A;  . TONSILLECTOMY    . VAGINAL  HYSTERECTOMY       Family History  Problem Relation Age of Onset  . Congestive Heart Failure Mother   . Heart attack Father      Social History   Social History  . Marital status: Married    Spouse name: N/A  . Number of children: N/A  . Years of education: N/A   Occupational History  . Not on file.   Social History Main Topics  . Smoking status: Never Smoker  . Smokeless tobacco: Never Used  . Alcohol use Yes     Comment: occ wine  . Drug use: No  . Sexual activity: Not on file   Other Topics Concern  . Not on file   Social History Narrative  . No narrative on file     BP (!) 144/64   Pulse (!) 57   Ht 5' (1.524 m)   Wt 176 lb (79.8 kg)   BMI 34.37 kg/m   Physical Exam:  Well appearing elderly woman, NAD HEENT:  Unremarkable Neck:  6 cm JVD, no thyromegally Lymphatics:  No adenopathy Back:  No CVA tenderness Lungs:  Clear with no wheezes HEART:  Regular rate rhythm, no murmurs, no rubs, no clicks Abd:  soft, positive bowel sounds, no organomegally, no rebound, no guarding Ext:  2 plus pulses, no edema, no cyanosis, no clubbing Skin:  No rashes no nodules Neuro:  CN II through XII intact, motor grossly intact  EKG - NSR with RBBB   Assess/Plan: 1. Sinus node dysfunction - her symptoms are mild and she had no sustained brady or pauses on cardiac monitoring. I would suggest we avoid AV or sinus nodal blocking drugs and a period of watchful waiting. 2. 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 No evidence of symptomatic heart block. I have warned her of symptoms she might experience if her conduction system disease worsens. 3. Sob with exertion - concerning that this could be her anginal equivalent. She is pending a stress test. 4. Chronic stage two renal insufficiency - her symptoms are stable. She will avoid renal toxins. She should be ok with low dose lasix and ACE inhibitors.  Mikle Bosworth.D.

## 2015-12-06 ENCOUNTER — Institutional Professional Consult (permissible substitution): Payer: PPO | Admitting: Internal Medicine

## 2015-12-13 ENCOUNTER — Telehealth (HOSPITAL_COMMUNITY): Payer: Self-pay

## 2015-12-13 NOTE — Telephone Encounter (Signed)
Encounter complete. 

## 2015-12-14 ENCOUNTER — Ambulatory Visit (HOSPITAL_COMMUNITY)
Admission: RE | Admit: 2015-12-14 | Discharge: 2015-12-14 | Disposition: A | Discharge status: Home / Self Care | Payer: PPO | Source: Ambulatory Visit | Attending: Cardiovascular Disease | Admitting: Cardiovascular Disease

## 2015-12-14 DIAGNOSIS — I493 Ventricular premature depolarization: Secondary | ICD-10-CM | POA: Diagnosis not present

## 2015-12-14 DIAGNOSIS — Z8249 Family history of ischemic heart disease and other diseases of the circulatory system: Secondary | ICD-10-CM | POA: Diagnosis not present

## 2015-12-14 DIAGNOSIS — R001 Bradycardia, unspecified: Secondary | ICD-10-CM | POA: Diagnosis not present

## 2015-12-14 DIAGNOSIS — E039 Hypothyroidism, unspecified: Secondary | ICD-10-CM | POA: Insufficient documentation

## 2015-12-14 DIAGNOSIS — R0609 Other forms of dyspnea: Secondary | ICD-10-CM | POA: Insufficient documentation

## 2015-12-14 DIAGNOSIS — I10 Essential (primary) hypertension: Secondary | ICD-10-CM | POA: Diagnosis not present

## 2015-12-14 DIAGNOSIS — R748 Abnormal levels of other serum enzymes: Secondary | ICD-10-CM

## 2015-12-14 LAB — MYOCARDIAL PERFUSION IMAGING
CHL CUP NUCLEAR SDS: 4
CHL CUP NUCLEAR SRS: 8
CHL CUP NUCLEAR SSS: 12
LV sys vol: 54 mL
LVDIAVOL: 106 mL (ref 46–106)
NUC STRESS TID: 1.03
Peak HR: 67 {beats}/min
Rest HR: 51 {beats}/min

## 2015-12-14 MED ORDER — TECHNETIUM TC 99M TETROFOSMIN IV KIT
10.9000 | PACK | Freq: Once | INTRAVENOUS | Status: AC | PRN
  Administered 2015-12-14: 10.9 via INTRAVENOUS
  Filled 2015-12-14: qty 11

## 2015-12-14 MED ORDER — REGADENOSON 0.4 MG/5ML IV SOLN
0.4000 mg | Freq: Once | INTRAVENOUS | Status: AC
  Administered 2015-12-14: 0.4 mg via INTRAVENOUS

## 2015-12-14 MED ORDER — TECHNETIUM TC 99M TETROFOSMIN IV KIT
30.6000 | PACK | Freq: Once | INTRAVENOUS | Status: AC | PRN
  Administered 2015-12-14: 30.6 via INTRAVENOUS
  Filled 2015-12-14: qty 31

## 2015-12-14 MED ORDER — AMINOPHYLLINE 25 MG/ML IV SOLN
75.0000 mg | Freq: Once | INTRAVENOUS | Status: AC
  Administered 2015-12-14: 75 mg via INTRAVENOUS

## 2016-02-06 NOTE — Progress Notes (Signed)
Cardiology Office Note   Date:  02/07/2016   ID:  April Hodges, DOB 27-Mar-1925, MRN [id number]  PCP:  Mathews Argyle, MD  Cardiologist:   Skeet Latch, MD   Chief Complaint  Hodges presents with  . Follow-up     History of Present Illness: April Hodges is a 82 y.o. female with hypertension, hyperlipidemia, and hyperthyroidism who presents for follow up.  April Hodges was hospitalized 05/2014 for sepsis from a urinary source.  That hospitalization was complicated by elevated troponin of 7.4  April Hodges had no chest pain and it was felt to be demand ischemia.  Echo 05/2015 revealed normal systolic function and no wall motion abnormalities.  During that hospitalization April Hodges was noted to have mild hyperthyroidism.  Atorvastatin was discontinued due to diarrhea.  April Hodges did not experience any chest pain and did not undergo an ischemia evaluation, as April Hodges was physically active and denied symptoms of ischemia.  April Hodges reported fatigue on metoprolol so this was switched to nebivolol.  However, April Hodges developed bradycardia so this was switched to amlodipine.  However April Hodges developed lower extremity edema on amlodipine 2.5 mg daily and lisinopril was started.  Since April Hodges last appointment April Hodges continues to feel tired.  April Hodges has a 48 hour Holter that shwoed frequent PVCs (5157, 3%) and PACs (370)  April Hodges lowest heart rate was 47 bpm. April Hodges can't clearly correlate April Hodges fatigue with bradycardia.  April Hodges continues to have episdoes of feeling tired.  Especially tired in April afternoon.  April Hodges is able to exert herself by doing yardwork and other physical activities.  April Hodges just notices that April Hodges stamina isn't as good as it was in April past.  April Hodges has not pain and doesn't feel chest pain or short of breath.  April Hodges blood pressure has been in April 110/60s-130s/70s.  April Hodges had a Lexiscan Myoview 12/14/15 that was negative or ischemia.    Past Medical History:  Diagnosis Date  . Acute on chronic renal failure ([place]) 06/03/2015  . Arthritis   .  Cancer ([place])    skin cancer  . CKD (chronic kidney disease), stage III   . Demand ischemia ([place])    a. 05/2015 in setting of sepsis w/ troponin peak of 8.47;  b. 05/2015 Echo: EF 60-65%, no rwma, Gr1 DD-->No ischemic eval undertaken.  . Essential hypertension 06/03/2015  . GERD (gastroesophageal reflux disease)   . Headache    in younger years  . Hypothyroidism   . Pneumonia    as a child  . PONV (postoperative nausea and vomiting)   . Sinus bradycardia    a. states rates in 40's and low 50's are normal for April Hodges-->No AVN blocking agents.  . Systolic murmur    a. [id number] No sigificant valvular dzs on echo.  . Urosepsis 06/03/2015    Past Surgical History:  Procedure Laterality Date  . BILATERAL CARPAL TUNNEL RELEASE    . COLONOSCOPY    . EYE SURGERY Bilateral    cataracts removed and lens implant  . JOINT REPLACEMENT Bilateral    knees  . LUMBAR LAMINECTOMY/DECOMPRESSION MICRODISCECTOMY N/A 06/10/2014   Procedure: LUMBAR DECOMPRESSION L3 - L5 2 LEVELS;  Surgeon: Melina Schools, MD;  Location: [facility];  Service: Orthopedics;  Laterality: N/A;  . TONSILLECTOMY    . VAGINAL HYSTERECTOMY       Current Outpatient Prescriptions  Medication Sig Dispense Refill  . acetaminophen (TYLENOL) 500 MG tablet Take 500 mg by mouth 2 (two) times daily as needed for mild pain.    Marland Kitchen  aspirin EC 81 MG tablet Take 81 mg by mouth daily.    . furosemide (LASIX) 20 MG tablet Take 1 tablet (20 mg total) by mouth every morning. 90 tablet 3  . levothyroxine (SYNTHROID, LEVOTHROID) 125 MCG tablet Take 1 tablet (125 mcg total) by mouth daily. 30 tablet 3  . MEGARED OMEGA-3 KRILL OIL 500 MG CAPS Take 500 mg by mouth daily.    . Multiple Vitamins-Minerals (MULTIVITAMIN PO) Take 1 tablet by mouth daily.    . nitroGLYCERIN (NITROSTAT) 0.4 MG SL tablet Place 1 tablet (0.4 mg total) under April tongue every 5 (five) minutes x 3 doses as needed for chest pain. 5 tablet 12  . omeprazole (PRILOSEC OTC) 20 MG tablet Take 10  mg by mouth daily.    Marland Kitchen lisinopril (PRINIVIL,ZESTRIL) 20 MG tablet Take 1 tablet (20 mg total) by mouth daily. 90 tablet 1   No current facility-administered medications for this visit.     Allergies:   Amlodipine and Bystolic [nebivolol hcl]    Social History:  April Hodges  reports that April Hodges has never smoked. April Hodges has never used smokeless tobacco. April Hodges reports that April Hodges drinks alcohol. April Hodges reports that April Hodges does not use drugs.   Family History:  April Hodges's family history includes Congestive Heart Failure in April Hodges mother; Heart attack in April Hodges father.    ROS:  Please see April history of present illness.   Otherwise, review of systems are positive for none.   All other systems are reviewed and negative.    PHYSICAL EXAM: VS:  BP (!) 150/64   Pulse (!) 50   Ht 5' (1.524 m)   Wt 75.8 kg (167 lb 3.2 oz)   BMI 32.65 kg/m  , BMI Body mass index is 32.65 kg/m. GENERAL:  Well appearing HEENT:  Pupils equal round and reactive, fundi not visualized, oral mucosa unremarkable NECK:  No jugular venous distention, waveform within normal limits, carotid upstroke brisk and symmetric, no bruits, no thyromegaly LYMPHATICS:  No cervical adenopathy LUNGS:  Clear to auscultation bilaterally HEART:  RRR.  PMI not displaced or sustained,S1 and S2 within normal limits, no S3, no S4, no clicks, no rubs, II/VI systolic murmur at April LUSB with radiation to April R carotid. ABD:  Flat, positive bowel sounds normal in frequency in pitch, no bruits, no rebound, no guarding, no midline pulsatile mass, no hepatomegaly, no splenomegaly EXT:  2 plus pulses throughout, no edema, no cyanosis no clubbing.  R LE chronically largerthan L SKIN:  No rashes no nodules NEURO:  Cranial nerves II through XII grossly intact, motor grossly intact throughout PSYCH:  Cognitively intact, oriented to person place and time   EKG:  EKG is not ordered today. April ekg ordered 06/04/15 demonstrates sinus rhythm rate 61 bpm. Incomplete  RBBB.  Echo 06/03/15: Study Conclusions  - Left ventricle: April cavity size was normal. Systolic function was  normal. April estimated ejection fraction was in April range of 60%  to 65%. Wall motion was normal; there were no regional wall  motion abnormalities. Doppler parameters are consistent with  abnormal left ventricular relaxation (grade 1 diastolic  dysfunction).  Lexiscan Myoview 12/14/15:  April left ventricular ejection fraction is mildly decreased (45-54%).  Nuclear stress EF: 49%.  There was no ST segment deviation noted during stress.  April study is normal.  This is a low risk study.   Low risk stress nuclear study with no ischemia or infarction; EF 49 but visually appears better; suggest echocardiogram to  better assess.   48 Hour Holter Monitor 11/01/15:  Quality: Fair.  Baseline artifact. Predominant rhythm: sinus rhythm Average heart rate: 63 bpm Max heart rate: 114 bpm Min heart rate: 47 bpm Pauses >2.5 seconds: 0 Ventricular ectopics: 5233 (5157 isolated, 38 bigemeny)  Percentage of ectopic beats: 3% Morphology: polymorphic 370 PACs Frequent 3-8 beat runs of PAT   Recent Labs: 06/02/2015: ALT 15 06/03/2015: B Natriuretic Peptide 188.9; TSH 0.318 06/05/2015: Hemoglobin 11.1; Platelets 142 10/17/2015: BUN 35; Creat 1.39; Potassium 4.8; Sodium 138    Lipid Panel    Component Value Date/Time   CHOL 144 06/03/2015 0430   TRIG 97 06/03/2015 0430   HDL 35 (L) 06/03/2015 0430   CHOLHDL 4.1 06/03/2015 0430   VLDL 19 06/03/2015 0430   LDLCALC 90 06/03/2015 0430      Wt Readings from Last 3 Encounters:  02/07/16 75.8 kg (167 lb 3.2 oz)  12/14/15 79.8 kg (176 lb)  11/29/15 79.8 kg (176 lb)      ASSESSMENT AND PLAN  # Bradycardia: Rates are at least as low as 47 on April monitor and April Hodges recorded 41 bpm at home.  April Hodges feels generally fatigued but continues to have fatigue despite whether April Hodges heart rate is low or high.  April Hodges is not interested in getting  a pacemaker.  April Hodges will continue to monitor April Hodges symptoms for now.  # Hypertension: BP is at goal (<150/90).  Continue lisinopril.  # Hyperlipidemia: April Hodges did not tolerate atorvastatin and has myalgias with rosuvastatin in April past.  April Hodges lipids are well-controlled.  Continue krill oil.  Current medicines are reviewed at length with April Hodges today.  April Hodges does not have concerns regarding medicines.  April following changes have been made:  None Labs/ tests ordered today include:   No orders of April defined types were placed in this encounter.    Disposition:   FU with Iyona Pehrson C. Oval Linsey, MD, [facility] in 1 year  This note was written with April assistance of speech recognition software.  Please excuse any transcriptional errors.  Signed, Kenzli Barritt C. Oval Linsey, MD, [facility]  02/07/2016 5:28 PM    [place]

## 2016-02-07 ENCOUNTER — Encounter: Payer: Self-pay | Admitting: Cardiovascular Disease

## 2016-02-07 ENCOUNTER — Ambulatory Visit (INDEPENDENT_AMBULATORY_CARE_PROVIDER_SITE_OTHER): Payer: PPO | Admitting: Cardiovascular Disease

## 2016-02-07 VITALS — BP 150/64 | HR 50 | Ht 60.0 in | Wt 167.2 lb

## 2016-02-07 DIAGNOSIS — I10 Essential (primary) hypertension: Secondary | ICD-10-CM | POA: Diagnosis not present

## 2016-02-07 DIAGNOSIS — E7800 Pure hypercholesterolemia, unspecified: Secondary | ICD-10-CM

## 2016-02-07 DIAGNOSIS — R001 Bradycardia, unspecified: Secondary | ICD-10-CM | POA: Diagnosis not present

## 2016-02-07 NOTE — Patient Instructions (Signed)

## 2016-04-02 ENCOUNTER — Other Ambulatory Visit: Payer: Self-pay

## 2016-04-02 MED ORDER — LISINOPRIL 20 MG PO TABS: 20.0000 mg | ORAL_TABLET | Freq: Every day | ORAL | 3 refills | Status: DC

## 2016-04-02 NOTE — Telephone Encounter (Signed)
Rx(s) sent to pharmacy electronically.  

## 2016-05-18 DIAGNOSIS — Z0000 Encounter for general adult medical examination without abnormal findings: Secondary | ICD-10-CM | POA: Diagnosis not present

## 2016-05-18 DIAGNOSIS — Z1389 Encounter for screening for other disorder: Secondary | ICD-10-CM | POA: Diagnosis not present

## 2016-05-25 DIAGNOSIS — N183 Chronic kidney disease, stage 3 (moderate): Secondary | ICD-10-CM | POA: Diagnosis not present

## 2016-05-25 DIAGNOSIS — I129 Hypertensive chronic kidney disease with stage 1 through stage 4 chronic kidney disease, or unspecified chronic kidney disease: Secondary | ICD-10-CM | POA: Diagnosis not present

## 2016-06-15 DIAGNOSIS — Z85828 Personal history of other malignant neoplasm of skin: Secondary | ICD-10-CM | POA: Diagnosis not present

## 2016-06-15 DIAGNOSIS — C44319 Basal cell carcinoma of skin of other parts of face: Secondary | ICD-10-CM | POA: Diagnosis not present

## 2016-06-15 DIAGNOSIS — C44311 Basal cell carcinoma of skin of nose: Secondary | ICD-10-CM | POA: Diagnosis not present

## 2016-06-15 DIAGNOSIS — L570 Actinic keratosis: Secondary | ICD-10-CM | POA: Diagnosis not present

## 2016-06-15 DIAGNOSIS — D0439 Carcinoma in situ of skin of other parts of face: Secondary | ICD-10-CM | POA: Diagnosis not present

## 2016-06-25 DIAGNOSIS — Z85828 Personal history of other malignant neoplasm of skin: Secondary | ICD-10-CM | POA: Diagnosis not present

## 2016-06-25 DIAGNOSIS — C44311 Basal cell carcinoma of skin of nose: Secondary | ICD-10-CM | POA: Diagnosis not present

## 2016-06-25 DIAGNOSIS — L570 Actinic keratosis: Secondary | ICD-10-CM | POA: Diagnosis not present

## 2016-07-03 DIAGNOSIS — C44311 Basal cell carcinoma of skin of nose: Secondary | ICD-10-CM | POA: Diagnosis not present

## 2016-07-03 DIAGNOSIS — Z85828 Personal history of other malignant neoplasm of skin: Secondary | ICD-10-CM | POA: Diagnosis not present

## 2016-07-23 ENCOUNTER — Other Ambulatory Visit: Payer: Self-pay | Admitting: Cardiovascular Disease

## 2016-07-23 NOTE — Telephone Encounter (Signed)
Rx(s) sent to pharmacy electronically.  

## 2016-07-23 NOTE — Telephone Encounter (Signed)
Please review for refill. Thanks!  

## 2016-08-21 DIAGNOSIS — Z85828 Personal history of other malignant neoplasm of skin: Secondary | ICD-10-CM | POA: Diagnosis not present

## 2016-08-21 DIAGNOSIS — L570 Actinic keratosis: Secondary | ICD-10-CM | POA: Diagnosis not present

## 2016-08-21 DIAGNOSIS — C4401 Basal cell carcinoma of skin of lip: Secondary | ICD-10-CM | POA: Diagnosis not present

## 2016-09-11 DIAGNOSIS — Z85828 Personal history of other malignant neoplasm of skin: Secondary | ICD-10-CM | POA: Diagnosis not present

## 2016-09-11 DIAGNOSIS — C4401 Basal cell carcinoma of skin of lip: Secondary | ICD-10-CM | POA: Diagnosis not present

## 2016-12-03 DIAGNOSIS — E669 Obesity, unspecified: Secondary | ICD-10-CM | POA: Diagnosis not present

## 2016-12-03 DIAGNOSIS — Z23 Encounter for immunization: Secondary | ICD-10-CM | POA: Diagnosis not present

## 2016-12-03 DIAGNOSIS — N183 Chronic kidney disease, stage 3 (moderate): Secondary | ICD-10-CM | POA: Diagnosis not present

## 2016-12-03 DIAGNOSIS — Z6830 Body mass index (BMI) 30.0-30.9, adult: Secondary | ICD-10-CM | POA: Diagnosis not present

## 2016-12-03 DIAGNOSIS — I129 Hypertensive chronic kidney disease with stage 1 through stage 4 chronic kidney disease, or unspecified chronic kidney disease: Secondary | ICD-10-CM | POA: Diagnosis not present

## 2016-12-03 DIAGNOSIS — I872 Venous insufficiency (chronic) (peripheral): Secondary | ICD-10-CM | POA: Diagnosis not present

## 2016-12-10 DIAGNOSIS — H35373 Puckering of macula, bilateral: Secondary | ICD-10-CM | POA: Diagnosis not present

## 2016-12-10 DIAGNOSIS — H43393 Other vitreous opacities, bilateral: Secondary | ICD-10-CM | POA: Diagnosis not present

## 2016-12-10 DIAGNOSIS — H35033 Hypertensive retinopathy, bilateral: Secondary | ICD-10-CM | POA: Diagnosis not present

## 2016-12-10 DIAGNOSIS — H43812 Vitreous degeneration, left eye: Secondary | ICD-10-CM | POA: Diagnosis not present

## 2017-02-02 ENCOUNTER — Other Ambulatory Visit: Payer: Self-pay | Admitting: Cardiovascular Disease

## 2017-02-04 NOTE — Telephone Encounter (Signed)
Please review for refill, thanks ! 

## 2017-02-06 DIAGNOSIS — I129 Hypertensive chronic kidney disease with stage 1 through stage 4 chronic kidney disease, or unspecified chronic kidney disease: Secondary | ICD-10-CM | POA: Diagnosis not present

## 2017-02-06 DIAGNOSIS — N183 Chronic kidney disease, stage 3 (moderate): Secondary | ICD-10-CM | POA: Diagnosis not present

## 2017-03-14 ENCOUNTER — Other Ambulatory Visit: Payer: Self-pay | Admitting: Cardiovascular Disease

## 2017-03-14 NOTE — Telephone Encounter (Signed)
Please review for refill, Thanks !  

## 2017-04-28 ENCOUNTER — Other Ambulatory Visit: Payer: Self-pay | Admitting: Cardiovascular Disease

## 2017-04-29 ENCOUNTER — Other Ambulatory Visit: Payer: Self-pay | Admitting: Cardiovascular Disease

## 2017-04-29 NOTE — Telephone Encounter (Signed)
Refill Request.  

## 2017-04-29 NOTE — Telephone Encounter (Signed)
REFILL 

## 2017-05-28 ENCOUNTER — Other Ambulatory Visit: Payer: Self-pay | Admitting: Cardiovascular Disease

## 2017-05-28 NOTE — Telephone Encounter (Signed)
Please review for refill, Thanks !  

## 2017-05-31 DIAGNOSIS — Z1389 Encounter for screening for other disorder: Secondary | ICD-10-CM | POA: Diagnosis not present

## 2017-05-31 DIAGNOSIS — E039 Hypothyroidism, unspecified: Secondary | ICD-10-CM | POA: Diagnosis not present

## 2017-05-31 DIAGNOSIS — I10 Essential (primary) hypertension: Secondary | ICD-10-CM | POA: Diagnosis not present

## 2017-05-31 DIAGNOSIS — Z79899 Other long term (current) drug therapy: Secondary | ICD-10-CM | POA: Diagnosis not present

## 2017-05-31 DIAGNOSIS — Z0000 Encounter for general adult medical examination without abnormal findings: Secondary | ICD-10-CM | POA: Diagnosis not present

## 2017-08-26 ENCOUNTER — Other Ambulatory Visit: Payer: Self-pay | Admitting: Cardiovascular Disease

## 2017-10-25 ENCOUNTER — Other Ambulatory Visit: Payer: Self-pay | Admitting: Cardiovascular Disease

## 2017-11-26 DIAGNOSIS — I700 Atherosclerosis of aorta: Secondary | ICD-10-CM | POA: Diagnosis not present

## 2017-11-26 DIAGNOSIS — Z23 Encounter for immunization: Secondary | ICD-10-CM | POA: Diagnosis not present

## 2017-11-26 DIAGNOSIS — K219 Gastro-esophageal reflux disease without esophagitis: Secondary | ICD-10-CM | POA: Diagnosis not present

## 2017-11-26 DIAGNOSIS — N183 Chronic kidney disease, stage 3 (moderate): Secondary | ICD-10-CM | POA: Diagnosis not present

## 2017-11-26 DIAGNOSIS — E039 Hypothyroidism, unspecified: Secondary | ICD-10-CM | POA: Diagnosis not present

## 2017-11-26 DIAGNOSIS — I129 Hypertensive chronic kidney disease with stage 1 through stage 4 chronic kidney disease, or unspecified chronic kidney disease: Secondary | ICD-10-CM | POA: Diagnosis not present

## 2017-11-27 ENCOUNTER — Other Ambulatory Visit (HOSPITAL_COMMUNITY): Payer: Self-pay | Admitting: Geriatric Medicine

## 2017-11-27 DIAGNOSIS — I700 Atherosclerosis of aorta: Principal | ICD-10-CM

## 2017-11-27 DIAGNOSIS — I358 Other nonrheumatic aortic valve disorders: Secondary | ICD-10-CM

## 2017-11-27 DIAGNOSIS — R011 Cardiac murmur, unspecified: Secondary | ICD-10-CM

## 2017-11-28 ENCOUNTER — Other Ambulatory Visit: Payer: Self-pay

## 2017-11-28 ENCOUNTER — Ambulatory Visit (HOSPITAL_COMMUNITY): Payer: PPO | Attending: Cardiology

## 2017-11-28 DIAGNOSIS — I1310 Hypertensive heart and chronic kidney disease without heart failure, with stage 1 through stage 4 chronic kidney disease, or unspecified chronic kidney disease: Secondary | ICD-10-CM | POA: Insufficient documentation

## 2017-11-28 DIAGNOSIS — I700 Atherosclerosis of aorta: Secondary | ICD-10-CM

## 2017-11-28 DIAGNOSIS — E785 Hyperlipidemia, unspecified: Secondary | ICD-10-CM | POA: Diagnosis not present

## 2017-11-28 DIAGNOSIS — R011 Cardiac murmur, unspecified: Secondary | ICD-10-CM

## 2017-11-28 DIAGNOSIS — I083 Combined rheumatic disorders of mitral, aortic and tricuspid valves: Secondary | ICD-10-CM | POA: Diagnosis not present

## 2017-11-28 DIAGNOSIS — N189 Chronic kidney disease, unspecified: Secondary | ICD-10-CM | POA: Diagnosis not present

## 2017-11-28 DIAGNOSIS — I358 Other nonrheumatic aortic valve disorders: Secondary | ICD-10-CM

## 2017-12-19 DIAGNOSIS — N183 Chronic kidney disease, stage 3 (moderate): Secondary | ICD-10-CM | POA: Diagnosis not present

## 2017-12-19 DIAGNOSIS — I129 Hypertensive chronic kidney disease with stage 1 through stage 4 chronic kidney disease, or unspecified chronic kidney disease: Secondary | ICD-10-CM | POA: Diagnosis not present

## 2017-12-30 DIAGNOSIS — H35033 Hypertensive retinopathy, bilateral: Secondary | ICD-10-CM | POA: Diagnosis not present

## 2017-12-30 DIAGNOSIS — H43812 Vitreous degeneration, left eye: Secondary | ICD-10-CM | POA: Diagnosis not present

## 2017-12-30 DIAGNOSIS — H35373 Puckering of macula, bilateral: Secondary | ICD-10-CM | POA: Diagnosis not present

## 2017-12-30 DIAGNOSIS — H43393 Other vitreous opacities, bilateral: Secondary | ICD-10-CM | POA: Diagnosis not present

## 2018-01-23 ENCOUNTER — Other Ambulatory Visit: Payer: Self-pay | Admitting: Cardiovascular Disease

## 2018-02-04 DIAGNOSIS — C44319 Basal cell carcinoma of skin of other parts of face: Secondary | ICD-10-CM | POA: Diagnosis not present

## 2018-02-04 DIAGNOSIS — Z85828 Personal history of other malignant neoplasm of skin: Secondary | ICD-10-CM | POA: Diagnosis not present

## 2018-02-04 DIAGNOSIS — C4441 Basal cell carcinoma of skin of scalp and neck: Secondary | ICD-10-CM | POA: Diagnosis not present

## 2018-02-22 ENCOUNTER — Other Ambulatory Visit: Payer: Self-pay | Admitting: Cardiovascular Disease

## 2018-03-25 DIAGNOSIS — C44319 Basal cell carcinoma of skin of other parts of face: Secondary | ICD-10-CM | POA: Diagnosis not present

## 2018-03-25 DIAGNOSIS — Z85828 Personal history of other malignant neoplasm of skin: Secondary | ICD-10-CM | POA: Diagnosis not present

## 2018-04-01 DIAGNOSIS — C44319 Basal cell carcinoma of skin of other parts of face: Secondary | ICD-10-CM | POA: Diagnosis not present

## 2018-04-01 DIAGNOSIS — Z85828 Personal history of other malignant neoplasm of skin: Secondary | ICD-10-CM | POA: Diagnosis not present

## 2018-04-01 DIAGNOSIS — C4441 Basal cell carcinoma of skin of scalp and neck: Secondary | ICD-10-CM | POA: Diagnosis not present

## 2018-04-09 DIAGNOSIS — M1990 Unspecified osteoarthritis, unspecified site: Secondary | ICD-10-CM | POA: Diagnosis not present

## 2018-04-09 DIAGNOSIS — N183 Chronic kidney disease, stage 3 (moderate): Secondary | ICD-10-CM | POA: Diagnosis not present

## 2018-04-09 DIAGNOSIS — I10 Essential (primary) hypertension: Secondary | ICD-10-CM | POA: Diagnosis not present

## 2018-04-09 DIAGNOSIS — E039 Hypothyroidism, unspecified: Secondary | ICD-10-CM | POA: Diagnosis not present

## 2018-04-09 DIAGNOSIS — I129 Hypertensive chronic kidney disease with stage 1 through stage 4 chronic kidney disease, or unspecified chronic kidney disease: Secondary | ICD-10-CM | POA: Diagnosis not present

## 2018-04-21 DIAGNOSIS — I129 Hypertensive chronic kidney disease with stage 1 through stage 4 chronic kidney disease, or unspecified chronic kidney disease: Secondary | ICD-10-CM | POA: Diagnosis not present

## 2018-04-21 DIAGNOSIS — N183 Chronic kidney disease, stage 3 (moderate): Secondary | ICD-10-CM | POA: Diagnosis not present

## 2018-04-21 DIAGNOSIS — I10 Essential (primary) hypertension: Secondary | ICD-10-CM | POA: Diagnosis not present

## 2018-04-21 DIAGNOSIS — M1990 Unspecified osteoarthritis, unspecified site: Secondary | ICD-10-CM | POA: Diagnosis not present

## 2018-04-21 DIAGNOSIS — E039 Hypothyroidism, unspecified: Secondary | ICD-10-CM | POA: Diagnosis not present

## 2018-05-14 DIAGNOSIS — E039 Hypothyroidism, unspecified: Secondary | ICD-10-CM | POA: Diagnosis not present

## 2018-05-14 DIAGNOSIS — I129 Hypertensive chronic kidney disease with stage 1 through stage 4 chronic kidney disease, or unspecified chronic kidney disease: Secondary | ICD-10-CM | POA: Diagnosis not present

## 2018-05-14 DIAGNOSIS — N183 Chronic kidney disease, stage 3 (moderate): Secondary | ICD-10-CM | POA: Diagnosis not present

## 2018-05-14 DIAGNOSIS — M1990 Unspecified osteoarthritis, unspecified site: Secondary | ICD-10-CM | POA: Diagnosis not present

## 2018-05-14 DIAGNOSIS — I10 Essential (primary) hypertension: Secondary | ICD-10-CM | POA: Diagnosis not present

## 2018-06-24 DIAGNOSIS — E039 Hypothyroidism, unspecified: Secondary | ICD-10-CM | POA: Diagnosis not present

## 2018-06-24 DIAGNOSIS — I129 Hypertensive chronic kidney disease with stage 1 through stage 4 chronic kidney disease, or unspecified chronic kidney disease: Secondary | ICD-10-CM | POA: Diagnosis not present

## 2018-06-24 DIAGNOSIS — M1990 Unspecified osteoarthritis, unspecified site: Secondary | ICD-10-CM | POA: Diagnosis not present

## 2018-06-24 DIAGNOSIS — I10 Essential (primary) hypertension: Secondary | ICD-10-CM | POA: Diagnosis not present

## 2018-06-24 DIAGNOSIS — N183 Chronic kidney disease, stage 3 (moderate): Secondary | ICD-10-CM | POA: Diagnosis not present

## 2018-07-12 ENCOUNTER — Encounter (HOSPITAL_COMMUNITY): Payer: Self-pay | Admitting: Emergency Medicine

## 2018-07-12 ENCOUNTER — Emergency Department (HOSPITAL_COMMUNITY): Payer: PPO

## 2018-07-12 ENCOUNTER — Other Ambulatory Visit: Payer: Self-pay

## 2018-07-12 ENCOUNTER — Inpatient Hospital Stay (HOSPITAL_COMMUNITY)
Admission: EM | Admit: 2018-07-12 | Discharge: 2018-07-17 | DRG: 418 | Disposition: A | Discharge status: Home / Self Care | Payer: PPO | Attending: Internal Medicine | Admitting: Internal Medicine

## 2018-07-12 DIAGNOSIS — I130 Hypertensive heart and chronic kidney disease with heart failure and stage 1 through stage 4 chronic kidney disease, or unspecified chronic kidney disease: Secondary | ICD-10-CM | POA: Diagnosis present

## 2018-07-12 DIAGNOSIS — K8020 Calculus of gallbladder without cholecystitis without obstruction: Secondary | ICD-10-CM | POA: Diagnosis not present

## 2018-07-12 DIAGNOSIS — Z9841 Cataract extraction status, right eye: Secondary | ICD-10-CM | POA: Diagnosis not present

## 2018-07-12 DIAGNOSIS — Z961 Presence of intraocular lens: Secondary | ICD-10-CM | POA: Diagnosis present

## 2018-07-12 DIAGNOSIS — I5032 Chronic diastolic (congestive) heart failure: Secondary | ICD-10-CM | POA: Diagnosis present

## 2018-07-12 DIAGNOSIS — K9161 Intraoperative hemorrhage and hematoma of a digestive system organ or structure complicating a digestive sytem procedure: Secondary | ICD-10-CM | POA: Diagnosis not present

## 2018-07-12 DIAGNOSIS — Z9842 Cataract extraction status, left eye: Secondary | ICD-10-CM

## 2018-07-12 DIAGNOSIS — N183 Chronic kidney disease, stage 3 unspecified: Secondary | ICD-10-CM | POA: Diagnosis present

## 2018-07-12 DIAGNOSIS — K8062 Calculus of gallbladder and bile duct with acute cholecystitis without obstruction: Secondary | ICD-10-CM | POA: Diagnosis not present

## 2018-07-12 DIAGNOSIS — Z90710 Acquired absence of both cervix and uterus: Secondary | ICD-10-CM

## 2018-07-12 DIAGNOSIS — Z79899 Other long term (current) drug therapy: Secondary | ICD-10-CM | POA: Diagnosis not present

## 2018-07-12 DIAGNOSIS — R7881 Bacteremia: Secondary | ICD-10-CM | POA: Diagnosis not present

## 2018-07-12 DIAGNOSIS — Z1611 Resistance to penicillins: Secondary | ICD-10-CM | POA: Diagnosis present

## 2018-07-12 DIAGNOSIS — Z8701 Personal history of pneumonia (recurrent): Secondary | ICD-10-CM | POA: Diagnosis not present

## 2018-07-12 DIAGNOSIS — M1990 Unspecified osteoarthritis, unspecified site: Secondary | ICD-10-CM | POA: Diagnosis not present

## 2018-07-12 DIAGNOSIS — K831 Obstruction of bile duct: Secondary | ICD-10-CM | POA: Diagnosis not present

## 2018-07-12 DIAGNOSIS — Z79890 Hormone replacement therapy (postmenopausal): Secondary | ICD-10-CM | POA: Diagnosis not present

## 2018-07-12 DIAGNOSIS — R079 Chest pain, unspecified: Secondary | ICD-10-CM | POA: Diagnosis not present

## 2018-07-12 DIAGNOSIS — K5750 Diverticulosis of both small and large intestine without perforation or abscess without bleeding: Secondary | ICD-10-CM | POA: Diagnosis present

## 2018-07-12 DIAGNOSIS — Z96653 Presence of artificial knee joint, bilateral: Secondary | ICD-10-CM | POA: Diagnosis present

## 2018-07-12 DIAGNOSIS — Z1159 Encounter for screening for other viral diseases: Secondary | ICD-10-CM

## 2018-07-12 DIAGNOSIS — R110 Nausea: Secondary | ICD-10-CM | POA: Diagnosis not present

## 2018-07-12 DIAGNOSIS — K219 Gastro-esophageal reflux disease without esophagitis: Secondary | ICD-10-CM | POA: Diagnosis present

## 2018-07-12 DIAGNOSIS — B9620 Unspecified Escherichia coli [E. coli] as the cause of diseases classified elsewhere: Secondary | ICD-10-CM | POA: Diagnosis not present

## 2018-07-12 DIAGNOSIS — R1013 Epigastric pain: Secondary | ICD-10-CM | POA: Diagnosis not present

## 2018-07-12 DIAGNOSIS — K8050 Calculus of bile duct without cholangitis or cholecystitis without obstruction: Secondary | ICD-10-CM | POA: Diagnosis present

## 2018-07-12 DIAGNOSIS — E039 Hypothyroidism, unspecified: Secondary | ICD-10-CM | POA: Diagnosis not present

## 2018-07-12 DIAGNOSIS — E785 Hyperlipidemia, unspecified: Secondary | ICD-10-CM | POA: Diagnosis not present

## 2018-07-12 DIAGNOSIS — R52 Pain, unspecified: Secondary | ICD-10-CM | POA: Diagnosis not present

## 2018-07-12 DIAGNOSIS — Z888 Allergy status to other drugs, medicaments and biological substances status: Secondary | ICD-10-CM

## 2018-07-12 DIAGNOSIS — I10 Essential (primary) hypertension: Secondary | ICD-10-CM | POA: Diagnosis present

## 2018-07-12 DIAGNOSIS — N261 Atrophy of kidney (terminal): Secondary | ICD-10-CM | POA: Diagnosis not present

## 2018-07-12 DIAGNOSIS — Z85828 Personal history of other malignant neoplasm of skin: Secondary | ICD-10-CM | POA: Diagnosis not present

## 2018-07-12 DIAGNOSIS — I509 Heart failure, unspecified: Secondary | ICD-10-CM | POA: Diagnosis not present

## 2018-07-12 DIAGNOSIS — I110 Hypertensive heart disease with heart failure: Secondary | ICD-10-CM | POA: Diagnosis not present

## 2018-07-12 DIAGNOSIS — R911 Solitary pulmonary nodule: Secondary | ICD-10-CM | POA: Diagnosis present

## 2018-07-12 DIAGNOSIS — Z87440 Personal history of urinary (tract) infections: Secondary | ICD-10-CM

## 2018-07-12 DIAGNOSIS — R0602 Shortness of breath: Secondary | ICD-10-CM | POA: Diagnosis not present

## 2018-07-12 DIAGNOSIS — R001 Bradycardia, unspecified: Secondary | ICD-10-CM | POA: Diagnosis not present

## 2018-07-12 DIAGNOSIS — Z8249 Family history of ischemic heart disease and other diseases of the circulatory system: Secondary | ICD-10-CM

## 2018-07-12 DIAGNOSIS — K8010 Calculus of gallbladder with chronic cholecystitis without obstruction: Secondary | ICD-10-CM | POA: Diagnosis not present

## 2018-07-12 DIAGNOSIS — R1011 Right upper quadrant pain: Secondary | ICD-10-CM | POA: Diagnosis not present

## 2018-07-12 LAB — APTT: aPTT: 28 seconds (ref 24–36)

## 2018-07-12 LAB — URINALYSIS, ROUTINE W REFLEX MICROSCOPIC
Bilirubin Urine: NEGATIVE
Glucose, UA: NEGATIVE mg/dL
Hgb urine dipstick: NEGATIVE
Ketones, ur: NEGATIVE mg/dL
Leukocytes,Ua: NEGATIVE
Nitrite: NEGATIVE
Protein, ur: 30 mg/dL — AB
Specific Gravity, Urine: 1.016 (ref 1.005–1.030)
pH: 7 (ref 5.0–8.0)

## 2018-07-12 LAB — CBC WITH DIFFERENTIAL/PLATELET
Abs Immature Granulocytes: 0.03 10*3/uL (ref 0.00–0.07)
Basophils Absolute: 0.1 10*3/uL (ref 0.0–0.1)
Basophils Relative: 1 %
Eosinophils Absolute: 0.1 10*3/uL (ref 0.0–0.5)
Eosinophils Relative: 1 %
HCT: 41 % (ref 36.0–46.0)
Hemoglobin: 13.2 g/dL (ref 12.0–15.0)
Immature Granulocytes: 0 %
Lymphocytes Relative: 12 %
Lymphs Abs: 1.2 10*3/uL (ref 0.7–4.0)
MCH: 30.3 pg (ref 26.0–34.0)
MCHC: 32.2 g/dL (ref 30.0–36.0)
MCV: 94 fL (ref 80.0–100.0)
Monocytes Absolute: 0.4 10*3/uL (ref 0.1–1.0)
Monocytes Relative: 4 %
Neutro Abs: 7.9 10*3/uL — ABNORMAL HIGH (ref 1.7–7.7)
Neutrophils Relative %: 82 %
Platelets: 253 10*3/uL (ref 150–400)
RBC: 4.36 MIL/uL (ref 3.87–5.11)
RDW: 12.7 % (ref 11.5–15.5)
WBC: 9.7 10*3/uL (ref 4.0–10.5)
nRBC: 0 % (ref 0.0–0.2)

## 2018-07-12 LAB — COMPREHENSIVE METABOLIC PANEL
ALT: 98 U/L — ABNORMAL HIGH (ref 0–44)
AST: 177 U/L — ABNORMAL HIGH (ref 15–41)
Albumin: 3.6 g/dL (ref 3.5–5.0)
Alkaline Phosphatase: 109 U/L (ref 38–126)
Anion gap: 13 (ref 5–15)
BUN: 26 mg/dL — ABNORMAL HIGH (ref 8–23)
CO2: 22 mmol/L (ref 22–32)
Calcium: 9.6 mg/dL (ref 8.9–10.3)
Chloride: 102 mmol/L (ref 98–111)
Creatinine, Ser: 1.22 mg/dL — ABNORMAL HIGH (ref 0.44–1.00)
GFR calc Af Amer: 45 mL/min — ABNORMAL LOW (ref >60)
GFR calc non Af Amer: 39 mL/min — ABNORMAL LOW (ref >60)
Glucose, Bld: 135 mg/dL — ABNORMAL HIGH (ref 70–99)
Potassium: 4.9 mmol/L (ref 3.5–5.1)
Sodium: 137 mmol/L (ref 135–145)
Total Bilirubin: 1.7 mg/dL — ABNORMAL HIGH (ref 0.3–1.2)
Total Protein: 7.3 g/dL (ref 6.5–8.1)

## 2018-07-12 LAB — PROTIME-INR
INR: 1 (ref 0.8–1.2)
Prothrombin Time: 13.4 seconds (ref 11.4–15.2)

## 2018-07-12 LAB — BRAIN NATRIURETIC PEPTIDE: B Natriuretic Peptide: 213.1 pg/mL — ABNORMAL HIGH (ref 0.0–100.0)

## 2018-07-12 LAB — SARS CORONAVIRUS 2 BY RT PCR (HOSPITAL ORDER, PERFORMED IN ~~LOC~~ HOSPITAL LAB): SARS Coronavirus 2: NEGATIVE

## 2018-07-12 LAB — LIPASE, BLOOD: Lipase: 33 U/L (ref 11–51)

## 2018-07-12 LAB — TROPONIN I: Troponin I: <0.03 ng/mL (ref <0.03)

## 2018-07-12 MED ORDER — ADULT MULTIVITAMIN W/MINERALS CH
ORAL_TABLET | Freq: Every day | ORAL | Status: DC
  Filled 2018-07-12 (×2): qty 1

## 2018-07-12 MED ORDER — LEVOTHYROXINE SODIUM 25 MCG PO TABS
125.0000 ug | ORAL_TABLET | Freq: Every day | ORAL | Status: DC
  Administered 2018-07-13 – 2018-07-17 (×5): 125 ug via ORAL
  Filled 2018-07-12 (×5): qty 1

## 2018-07-12 MED ORDER — OXYCODONE-ACETAMINOPHEN 5-325 MG PO TABS: 1.0000 | ORAL_TABLET | ORAL | Status: DC | PRN

## 2018-07-12 MED ORDER — IOHEXOL 350 MG/ML SOLN
80.0000 mL | Freq: Once | INTRAVENOUS | Status: AC | PRN
  Administered 2018-07-12: 80 mL via INTRAVENOUS

## 2018-07-12 MED ORDER — DIPHENHYDRAMINE HCL 50 MG/ML IJ SOLN
12.5000 mg | Freq: Once | INTRAMUSCULAR | Status: AC
  Administered 2018-07-12: 12.5 mg via INTRAVENOUS
  Filled 2018-07-12: qty 1

## 2018-07-12 MED ORDER — DILTIAZEM HCL ER COATED BEADS 120 MG PO CP24
120.0000 mg | ORAL_CAPSULE | Freq: Every day | ORAL | Status: DC
  Administered 2018-07-13 – 2018-07-17 (×4): 120 mg via ORAL
  Filled 2018-07-12 (×4): qty 1

## 2018-07-12 MED ORDER — PANTOPRAZOLE SODIUM 40 MG PO TBEC
40.0000 mg | DELAYED_RELEASE_TABLET | Freq: Every day | ORAL | Status: DC
  Administered 2018-07-12 – 2018-07-14 (×3): 40 mg via ORAL
  Filled 2018-07-12 (×3): qty 1

## 2018-07-12 MED ORDER — ACETAMINOPHEN 650 MG RE SUPP: 650.0000 mg | Freq: Four times a day (QID) | RECTAL | Status: DC | PRN

## 2018-07-12 MED ORDER — METOCLOPRAMIDE HCL 5 MG/ML IJ SOLN
10.0000 mg | Freq: Once | INTRAMUSCULAR | Status: AC
  Administered 2018-07-12: 10 mg via INTRAVENOUS
  Filled 2018-07-12: qty 2

## 2018-07-12 MED ORDER — PIPERACILLIN-TAZOBACTAM 3.375 G IVPB 30 MIN
3.3750 g | Freq: Once | INTRAVENOUS | Status: AC
  Administered 2018-07-12: 3.375 g via INTRAVENOUS

## 2018-07-12 MED ORDER — MORPHINE SULFATE (PF) 2 MG/ML IV SOLN: 0.5000 mg | INTRAVENOUS | Status: DC | PRN

## 2018-07-12 MED ORDER — HYDRALAZINE HCL 20 MG/ML IJ SOLN: 5.0000 mg | INTRAMUSCULAR | Status: DC | PRN

## 2018-07-12 MED ORDER — MORPHINE SULFATE (PF) 4 MG/ML IV SOLN
4.0000 mg | Freq: Once | INTRAVENOUS | Status: AC
  Administered 2018-07-12: 4 mg via INTRAVENOUS
  Filled 2018-07-12: qty 1

## 2018-07-12 MED ORDER — LISINOPRIL 20 MG PO TABS
20.0000 mg | ORAL_TABLET | Freq: Every day | ORAL | Status: DC
  Administered 2018-07-13 – 2018-07-17 (×4): 20 mg via ORAL
  Filled 2018-07-12 (×4): qty 1

## 2018-07-12 MED ORDER — ACETAMINOPHEN 325 MG PO TABS
650.0000 mg | ORAL_TABLET | Freq: Four times a day (QID) | ORAL | Status: DC | PRN
  Administered 2018-07-15 – 2018-07-17 (×4): 650 mg via ORAL
  Filled 2018-07-12 (×4): qty 2

## 2018-07-12 MED ORDER — PIPERACILLIN-TAZOBACTAM 3.375 G IVPB
3.3750 g | Freq: Three times a day (TID) | INTRAVENOUS | Status: DC
  Administered 2018-07-13 – 2018-07-17 (×13): 3.375 g via INTRAVENOUS
  Filled 2018-07-12 (×16): qty 50

## 2018-07-12 MED ORDER — SODIUM CHLORIDE 0.9 % IV SOLN
INTRAVENOUS | Status: DC
  Administered 2018-07-12: 23:00:00 via INTRAVENOUS

## 2018-07-12 MED ORDER — ONDANSETRON HCL 4 MG/2ML IJ SOLN
4.0000 mg | Freq: Three times a day (TID) | INTRAMUSCULAR | Status: DC | PRN
  Administered 2018-07-14: 4 mg via INTRAVENOUS

## 2018-07-12 MED ORDER — MORPHINE SULFATE (PF) 4 MG/ML IV SOLN
4.0000 mg | Freq: Once | INTRAVENOUS | Status: DC
  Filled 2018-07-12: qty 1

## 2018-07-12 NOTE — ED Notes (Signed)
ED TO INPATIENT HANDOFF REPORT  ED Nurse Name and Phone #:  [phone]  S Name/Age/Gender April Hodges 85 y.o. female Room/Bed: 021C/021C  Code Status   Code Status: Full Code  Home/SNF/Other Home Patient oriented to: self, place, time and situation Is this baseline? Yes   Triage Complete: Triage complete  Chief Complaint CP  Triage Note Per [facility] pt coming from home ([facility]) after having a sudden onset of mid epigastric pain radiating into back after gardening. Patient given 324 aspirin, 8mg  zofran and 2 nitro with no relief. Patient alert and orientated x 4.    Allergies Allergies  Allergen Reactions  . Amlodipine Swelling    Coughing as well  . Bystolic [Nebivolol Hcl] Other (See Comments)    Bradycardia - HR in the 40s    Level of Care/Admitting Diagnosis ED Disposition    ED Disposition Condition [place]: [place] [[id number]]  Level of Care: Telemetry Medical [104]  Covid Evaluation: Person Under Investigation (PUI)  Isolation Risk Level: Low Risk/Droplet (Less than 4L [place] supplementation)  Diagnosis: Biliary colic [545625]  Admitting Physician: Ivor Costa [place]  Attending Physician: Ivor Costa [phone]  Estimated length of stay: past midnight tomorrow  Certification:: I certify this patient will need inpatient services for at least 2 midnights  Bed request comments: covid19 rule out, low risk  PT Class (Do Not Modify): Inpatient [101]  PT Acc Code (Do Not Modify): Private [1]       B Medical/Surgery History Past Medical History:  Diagnosis Date  . Acute on chronic renal failure ([place]) 06/03/2015  . Arthritis   . Cancer ([place])    skin cancer  . CKD (chronic kidney disease), stage III ([place])   . Demand ischemia ([place])    a. 05/2015 in setting of sepsis w/ troponin peak of 8.47;  b. 05/2015 Echo: EF 60-65%, no rwma, Gr1 DD-->No ischemic eval undertaken.  . Essential hypertension 06/03/2015  . GERD  (gastroesophageal reflux disease)   . Headache    in younger years  . Hypothyroidism   . Pneumonia    as a child  . PONV (postoperative nausea and vomiting)   . Sinus bradycardia    a. states rates in 40's and low 50's are normal for her-->No AVN blocking agents.  . Systolic murmur    a. 05/7340 No sigificant valvular dzs on echo.  . Urosepsis 06/03/2015   Past Surgical History:  Procedure Laterality Date  . BILATERAL CARPAL TUNNEL RELEASE    . COLONOSCOPY    . EYE SURGERY Bilateral    cataracts removed and lens implant  . JOINT REPLACEMENT Bilateral    knees  . LUMBAR LAMINECTOMY/DECOMPRESSION MICRODISCECTOMY N/A 06/10/2014   Procedure: LUMBAR DECOMPRESSION L3 - L5 2 LEVELS;  Surgeon: Melina Schools, MD;  Location: [facility];  Service: Orthopedics;  Laterality: N/A;  . TONSILLECTOMY    . VAGINAL HYSTERECTOMY       A IV Location/Drains/Wounds Patient Lines/Drains/Airways Status   Active Line/Drains/Airways    Name:   Placement date:   Placement time:   Site:   Days:   Peripheral IV 07/12/18 Left Antecubital   07/12/18    1658    Antecubital   less than 1   Closed System Drain 1 Left Back Bulb (JP) 19 Fr.   06/10/14    1709    Back   1493   Incision (Closed) 06/10/14 Back Other (Comment)   06/10/14  1415     1493   Incision (Closed) 06/10/14 Back Left   06/10/14    1757     1493          Intake/Output Last 24 hours  Intake/Output Summary (Last 24 hours) at 07/12/2018 2147 Last data filed at 07/12/2018 2104 Gross per 24 hour  Intake -  Output 280 ml  Net -280 ml    Labs/Imaging Results for orders placed or performed during the hospital encounter of 07/12/18 (from the past 48 hour(s))  CBC with Differential/Platelet     Status: Abnormal   Collection Time: 07/12/18  5:03 PM  Result Value Ref Range   WBC 9.7 4.0 - 10.5 K/uL   RBC 4.36 3.87 - 5.11 MIL/uL   Hemoglobin 13.2 12.0 - 15.0 g/dL   HCT 41.0 36.0 - 46.0 %   MCV 94.0 80.0 - 100.0 fL   MCH 30.3 26.0 - 34.0 pg    MCHC 32.2 30.0 - 36.0 g/dL   RDW 12.7 11.5 - 15.5 %   Platelets 253 150 - 400 K/uL   nRBC 0.0 0.0 - 0.2 %   Neutrophils Relative % 82 %   Neutro Abs 7.9 (H) 1.7 - 7.7 K/uL   Lymphocytes Relative 12 %   Lymphs Abs 1.2 0.7 - 4.0 K/uL   Monocytes Relative 4 %   Monocytes Absolute 0.4 0.1 - 1.0 K/uL   Eosinophils Relative 1 %   Eosinophils Absolute 0.1 0.0 - 0.5 K/uL   Basophils Relative 1 %   Basophils Absolute 0.1 0.0 - 0.1 K/uL   Immature Granulocytes 0 %   Abs Immature Granulocytes 0.03 0.00 - 0.07 K/uL    Comment: Performed at [facility], [address], [place], [place] [address]  Comprehensive metabolic panel     Status: Abnormal   Collection Time: 07/12/18  5:03 PM  Result Value Ref Range   Sodium 137 135 - 145 mmol/L   Potassium 4.9 3.5 - 5.1 mmol/L   Chloride 102 98 - 111 mmol/L   CO2 22 22 - 32 mmol/L   Glucose, Bld 135 (H) 70 - 99 mg/dL   BUN 26 (H) 8 - 23 mg/dL   Creatinine, Ser 1.22 (H) 0.44 - 1.00 mg/dL   Calcium 9.6 8.9 - 10.3 mg/dL   Total Protein 7.3 6.5 - 8.1 g/dL   Albumin 3.6 3.5 - 5.0 g/dL   AST 177 (H) 15 - 41 U/L   ALT 98 (H) 0 - 44 U/L   Alkaline Phosphatase 109 38 - 126 U/L   Total Bilirubin 1.7 (H) 0.3 - 1.2 mg/dL   GFR calc non Af Amer 39 (L) >60 mL/min   GFR calc Af Amer 45 (L) >60 mL/min   Anion gap 13 5 - 15    Comment: Performed at [facility] [address] [place], [place] [address]  Troponin I - ONCE - STAT     Status: None   Collection Time: 07/12/18  5:03 PM  Result Value Ref Range   Troponin I <0.03 <0.03 ng/mL    Comment: Performed at [facility], [place] [address], [place], [place] [address]  Lipase, blood     Status: None   Collection Time: 07/12/18  5:03 PM  Result Value Ref Range   Lipase 33 11 - 51 U/L    Comment: Performed at [facility] [address], [place], [place] [address]  Urinalysis, Routine w reflex microscopic  Status: Abnormal   Collection Time: 07/12/18  5:11 PM  Result Value Ref  Range   Color, Urine YELLOW YELLOW   APPearance CLEAR CLEAR   Specific Gravity, Urine 1.016 1.005 - 1.030   pH 7.0 5.0 - 8.0   Glucose, UA NEGATIVE NEGATIVE mg/dL   Hgb urine dipstick NEGATIVE NEGATIVE   Bilirubin Urine NEGATIVE NEGATIVE   Ketones, ur NEGATIVE NEGATIVE mg/dL   Protein, ur 30 (A) NEGATIVE mg/dL   Nitrite NEGATIVE NEGATIVE   Leukocytes,Ua NEGATIVE NEGATIVE   RBC / HPF 0-5 0 - 5 RBC/hpf   WBC, UA 0-5 0 - 5 WBC/hpf   Bacteria, UA RARE (A) NONE SEEN    Comment: Performed at [facility], [place] [address], [place], [place] [address]  SARS Coronavirus 2 (CEPHEID - Performed in [facility] hospital lab), Hosp Order     Status: None   Collection Time: 07/12/18  8:17 PM  Result Value Ref Range   SARS Coronavirus 2 NEGATIVE NEGATIVE    Comment: (NOTE) If result is NEGATIVE SARS-CoV-2 target nucleic acids are NOT DETECTED. The SARS-CoV-2 RNA is generally detectable in upper and lower  respiratory specimens during the acute phase of infection. The lowest  concentration of SARS-CoV-2 viral copies this assay can detect is 250  copies / mL. A negative result does not preclude SARS-CoV-2 infection  and should not be used as the sole basis for treatment or other  patient management decisions.  A negative result may occur with  improper specimen collection / handling, submission of specimen other  than nasopharyngeal swab, presence of viral mutation(s) within the  areas targeted by this assay, and inadequate number of viral copies  (<250 copies / mL). A negative result must be combined with clinical  observations, patient history, and epidemiological information. If result is POSITIVE SARS-CoV-2 target nucleic acids are DETECTED. The SARS-CoV-2 RNA is generally detectable in upper and lower  respiratory specimens dur ing the acute phase of infection.  Positive  results are indicative of active infection with SARS-CoV-2.  Clinical  correlation with patient history and other  diagnostic information is  necessary to determine patient infection status.  Positive results do  not rule out bacterial infection or co-infection with other viruses. If result is PRESUMPTIVE POSTIVE SARS-CoV-2 nucleic acids MAY BE PRESENT.   A presumptive positive result was obtained on the submitted specimen  and confirmed on repeat testing.  While 2019 novel coronavirus  (SARS-CoV-2) nucleic acids may be present in the submitted sample  additional confirmatory testing may be necessary for epidemiological  and / or clinical management purposes  to differentiate between  SARS-CoV-2 and other Sarbecovirus currently known to infect humans.  If clinically indicated additional testing with an alternate test  methodology [phone]) is advised. The SARS-CoV-2 RNA is generally  detectable in upper and lower respiratory sp ecimens during the acute  phase of infection. The expected result is Negative. Fact Sheet for Patients:  StrictlyIdeas.no Fact Sheet for Healthcare Providers: BankingDealers.co.za This test is not yet approved or cleared by the [place] FDA and has been authorized for detection and/or diagnosis of SARS-CoV-2 by FDA under an Emergency Use Authorization (EUA).  This EUA will remain in effect (meaning this test can be used) for the duration of the COVID-19 declaration under Section 564(b)(1) of the Act, 21 U.S.C. section 360bbb-3(b)(1), unless the authorization is terminated or revoked sooner. Performed at [facility], [place] [address] [place], [place] [address]    US  Abdomen Complete  Result Date: 07/12/2018 CLINICAL DATA:  Abdominal pain since 2 p.m. today. EXAM: ABDOMEN ULTRASOUND COMPLETE COMPARISON:  None. FINDINGS: Gallbladder: Cholelithiasis. No gallbladder wall thickening. No pericholecystic fluid. No sonographic Murphy sign. Common bile duct: Diameter: 10.8 mm.  No choledocholithiasis. Liver: No focal hepatic  lesion. Mild coarse hepatic echotexture with normal contour. Portal vein is patent on color Doppler imaging with normal direction of blood flow towards the liver. IVC: No abnormality visualized. Pancreas: Limited visualization secondary to overlying bowel gas. Spleen: Size and appearance within normal limits. Right Kidney: Length: 8.9 cm. Renal cortical thinning. Echogenicity within normal limits. No mass or hydronephrosis visualized. Left Kidney: Length: 7.8 cm. Renal cortical thinning. Echogenicity within normal limits. No mass or hydronephrosis visualized. Abdominal aorta: No aneurysm visualized. Other findings: None. IMPRESSION: 1. Cholelithiasis without sonographic evidence of acute cholecystitis. 2. Bilateral renal atrophy. Electronically Signed   By: Kathreen Devoid   On: 07/12/2018 18:13   Dg Chest Port 1 View  Result Date: 07/12/2018 CLINICAL DATA:  Per [facility] pt coming from home ([facility]) after having a sudden onset of mid epigastric pain radiating into back after gardening. Patient given 324 aspirin, 8mg  zofran and 2 nitro with no relief. Patient alert and orientated x 4. EXAM: PORTABLE CHEST - 1 VIEW COMPARISON:  06/02/2015 FINDINGS: Lungs are clear. Heart size upper limits normal. Aortic Atherosclerosis (ICD10-170.0). No effusion. Visualized bones unremarkable. IMPRESSION: No acute cardiopulmonary disease. Electronically Signed   By: Lucrezia Europe M.D.   On: 07/12/2018 17:18   Ct Angio Chest/abd/pel For Dissection W And/or Wo Contrast  Result Date: 07/12/2018 CLINICAL DATA:  Sudden onset of epigastric pain radiating into back after gardening. EXAM: CT ANGIOGRAPHY CHEST, ABDOMEN AND PELVIS TECHNIQUE: Multidetector CT imaging through the chest, abdomen and pelvis was performed using the standard protocol during bolus administration of intravenous contrast. Multiplanar reconstructed images and MIPs were obtained and reviewed to evaluate the vascular anatomy. CONTRAST:  59mL OMNIPAQUE  IOHEXOL 350 MG/ML SOLN COMPARISON:  Chest x-ray Jul 12, 2018 FINDINGS: CTA CHEST FINDINGS Cardiovascular: Mild cardiomegaly. Coronary artery calcifications involve the LAD and circumflex coronary arteries. Visualized pulmonary arteries are normal in appearance with no filling defects or pulmonary emboli noted. Mild scattered atherosclerotic changes are seen in the thoracic aorta. No aneurysm or dissection in the thoracic aorta. Mediastinum/Nodes: No enlarged mediastinal, hilar, or axillary lymph nodes. Thyroid gland, trachea, and esophagus demonstrate no significant findings. Lungs/Pleura: Mild scarring in the medial apices bilaterally. No pneumothorax. Central airways are normal. There is a nodule in the lateral left lung base measuring 7 mm in mean diameter. No other nodules. No masses or infiltrates. Musculoskeletal: See below. Review of the MIP images confirms the above findings. CTA ABDOMEN AND PELVIS FINDINGS VASCULAR Aorta: The abdominal aorta is tortuous without aneurysm or dissection. Celiac: Mild atherosclerotic changes are seen near the origin of the celiac artery with no narrowing. SMA: Patent without evidence of aneurysm, dissection, vasculitis or significant stenosis. Renals: Both renal arteries are patent without evidence of aneurysm, dissection, vasculitis, fibromuscular dysplasia or significant stenosis. IMA: Patent without evidence of aneurysm, dissection, vasculitis or significant stenosis. Inflow: Patent without evidence of aneurysm, dissection, vasculitis or significant stenosis. Veins: No obvious venous abnormality within the limitations of this arterial phase study. The iliac and proximal femoral arteries are normal in appearance with mild scattered atherosclerotic changes. Review of the MIP images confirms the above findings. NON-VASCULAR Hepatobiliary: Evaluation is limited due to arterial phase imaging. No liver masses are identified.  The portal vein is not well assessed due to timing of  contrast. The gallbladder is distended. A rounded region of high attenuation in the gallbladder is likely a stone or tumefactive sludge measuring up to 3.3 cm. No wall thickening or pericholecystic fluid noted. Pancreas: There is fatty infiltration of the pancreatic head of no acute significance. No suspicious masses identified or evidence of pancreatitis. Spleen: Normal in size without focal abnormality. Adrenals/Urinary Tract: Adrenal glands are normal. Bilateral renal cysts are identified. No hydronephrosis or acute perinephric stranding. No ureteral stones. The bladder is normal. Stomach/Bowel: The stomach is normal. A duodenal diverticulum is identified off the second portion of the duodenum without evidence of inflammation, of no acute significance. The small bowel is otherwise normal. Colonic diverticulosis is seen without diverticulitis. The appendix is normal. Lymphatic: No significant vascular findings are present. No enlarged abdominal or pelvic lymph nodes. Reproductive: Status post hysterectomy. No adnexal masses. Other: No abdominal wall hernia or abnormality. No abdominopelvic ascites. Musculoskeletal: L4 pars defects are noted with grade 2 anterolisthesis of L4 versus L5. No other malalignment. Severe degenerative changes in the lumbar spine with more mild degenerative changes in the thoracic spine. Lower lumbar facet degenerative changes identified. Other bones are unremarkable. Review of the MIP images confirms the above findings. IMPRESSION: 1. No aortic aneurysm or dissection. 2. The gallbladder is somewhat distended. A rounded region of high attenuation in the gallbladder measuring 3.3 cm is likely a stone or tumefactive sludge. No wall thickening or pericholecystic fluid. If there is clinical concern for acute cholecystitis, recommend ultrasound. 3. Significant degenerative changes in the lumbar spine. Scattered degenerative changes seen in the thoracic spine as well. 4. 7 mm nodule in the left  lower lobe. Non-contrast chest CT at 6-12 months is recommended. If the nodule is stable at time of repeat CT, then future CT at 18-24 months (from today's scan) is considered optional for low-risk patients, but is recommended for high-risk patients. This recommendation follows the consensus statement: Guidelines for Management of Incidental Pulmonary Nodules Detected on CT Images: From the Fleischner Society 2017; Radiology 2017; 284:228-243. 5. Coronary artery calcifications in the LAD and circumflex arteries. 6. Colonic diverticulosis without diverticulitis. Electronically Signed   By: Dorise Bullion III M.D   On: 07/12/2018 19:59    Pending Labs Unresulted Labs (From admission, onward)    Start     Ordered   07/13/18 7096  Basic metabolic panel  Tomorrow morning,   R     07/12/18 2119   07/13/18 0500  CBC  Tomorrow morning,   R     07/12/18 2119   07/12/18 2117  Type and screen [place]  Once,   R    Comments:  [place]    07/12/18 2116   07/12/18 2116  Brain natriuretic peptide  Once,   R     07/12/18 2116   07/12/18 2116  Protime-INR  Once,   R     07/12/18 2116   07/12/18 2116  APTT  Once,   R     07/12/18 2116   07/12/18 2115  Culture, blood (Routine X 2) w Reflex to ID Panel  BLOOD CULTURE X 2,   R    Comments:  Please obtain prior to antibiotic administration.    07/12/18 2114          Vitals/Pain Today's Vitals   07/12/18 2000 07/12/18 2019 07/12/18 2030 07/12/18 2104  BP: (!) 146/82  (!) 190/72  Pulse: 96  74   Resp:      Temp:      TempSrc:      SpO2: 94%  92%   Weight:      Height:      PainSc:  0-No pain  0-No pain    Isolation Precautions No active isolations  Medications Medications  piperacillin-tazobactam (ZOSYN) IVPB 3.375 g (has no administration in time range)  hydrALAZINE (APRESOLINE) injection 5 mg (has no administration in time range)  diltiazem (CARDIZEM CD) 24 hr capsule 120 mg (has no  administration in time range)  lisinopril (ZESTRIL) tablet 20 mg (has no administration in time range)  levothyroxine (SYNTHROID) tablet 125 mcg (has no administration in time range)  pantoprazole (PROTONIX) EC tablet 40 mg (has no administration in time range)  Multivitamin TABS (has no administration in time range)  0.9 %  sodium chloride infusion (has no administration in time range)  ondansetron (ZOFRAN) injection 4 mg (has no administration in time range)  acetaminophen (TYLENOL) tablet 650 mg (has no administration in time range)    Or  acetaminophen (TYLENOL) suppository 650 mg (has no administration in time range)  piperacillin-tazobactam (ZOSYN) IVPB 3.375 g (has no administration in time range)  metoCLOPramide (REGLAN) injection 10 mg (10 mg Intravenous Given 07/12/18 1713)  diphenhydrAMINE (BENADRYL) injection 12.5 mg (12.5 mg Intravenous Given 07/12/18 1712)  morphine 4 MG/ML injection 4 mg (4 mg Intravenous Given 07/12/18 1842)  iohexol (OMNIPAQUE) 350 MG/ML injection 80 mL (80 mLs Intravenous Contrast Given 07/12/18 1939)  metoCLOPramide (REGLAN) injection 10 mg (10 mg Intravenous Given 07/12/18 2012)    Mobility walks Low fall risk   Focused Assessments Cardiac Assessment Handoff:  Cardiac Rhythm: Sinus tachycardia Lab Results  Component Value Date   TROPONINI <0.03 07/12/2018   No results found for: DDIMER Does the Patient currently have chest pain? No     R Recommendations: See Admitting Provider Note  Report given to:   Additional Notes:

## 2018-07-12 NOTE — ED Notes (Signed)
Patient transported to Ultrasound 

## 2018-07-12 NOTE — Consult Note (Signed)
Reason for Consult: Biliary colic  Referring Physician: Blaine Hamper, MD  April Hodges is an 85 y.o. female.  HPI: This is a 85 year old female with multiple medical issues presents with acute onset of RUQ and epigastric abdominal pain, nausea, and vomiting.  The pain radiated around to her right flank.  No diarrhea, fever, chills.  She was brought in to the ED and ruled out for cardiac event.    She lives in her own facility at a retirement facility.  Partially dependent for assistance on some ADL's  COVID test is negative.  CT and ultrasound confirm cholelithiasis but no sign of cholecystitis.    Past Medical History:  Diagnosis Date  . Acute on chronic renal failure ([place]) 06/03/2015  . Arthritis   . Cancer ([place])    skin cancer  . CKD (chronic kidney disease), stage III ([place])   . Demand ischemia ([place])    a. 05/2015 in setting of sepsis w/ troponin peak of 8.47;  b. 05/2015 Echo: EF 60-65%, no rwma, Gr1 DD-->No ischemic eval undertaken.  . Essential hypertension 06/03/2015  . GERD (gastroesophageal reflux disease)   . Headache    in younger years  . Hypothyroidism   . Pneumonia    as a child  . PONV (postoperative nausea and vomiting)   . Sinus bradycardia    a. states rates in 40's and low 50's are normal for her-->No AVN blocking agents.  . Systolic murmur    a. 05/2438 No sigificant valvular dzs on echo.  . Urosepsis 06/03/2015    Past Surgical History:  Procedure Laterality Date  . BILATERAL CARPAL TUNNEL RELEASE    . COLONOSCOPY    . EYE SURGERY Bilateral    cataracts removed and lens implant  . JOINT REPLACEMENT Bilateral    knees  . LUMBAR LAMINECTOMY/DECOMPRESSION MICRODISCECTOMY N/A 06/10/2014   Procedure: LUMBAR DECOMPRESSION L3 - L5 2 LEVELS;  Surgeon: Melina Schools, MD;  Location: [facility];  Service: Orthopedics;  Laterality: N/A;  . TONSILLECTOMY    . VAGINAL HYSTERECTOMY      Family History  Problem Relation Age of Onset  . Congestive Heart Failure Mother   . Heart  attack Father     Social History:  reports that she has never smoked. She has never used smokeless tobacco. She reports current alcohol use. She reports that she does not use drugs.  Allergies:  Allergies  Allergen Reactions  . Amlodipine Swelling    Coughing as well  . Bystolic [Nebivolol Hcl] Other (See Comments)    Bradycardia - HR in the 40s    Medications:  Prior to Admission medications   Medication Sig Start Date End Date Taking? Authorizing Provider  acetaminophen (TYLENOL) 500 MG tablet Take 500 mg by mouth See admin instructions. 500mg  by mouth every morning and daily as needed for pain   Yes [provider]  diltiazem (CARDIZEM CD) 120 MG 24 hr capsule Take 120 mg by mouth daily. 06/25/18  Yes [provider]  levothyroxine (SYNTHROID, LEVOTHROID) 125 MCG tablet Take 1 tablet (125 mcg total) by mouth daily. 06/05/15  Yes Bhagat, Bhavinkumar, PA  lisinopril (PRINIVIL,ZESTRIL) 20 MG tablet Take 1 tablet (20 mg total) by mouth daily. NEED OV. 02/25/18  Yes Skeet Latch, MD  Multiple Vitamins-Minerals (MULTIVITAMIN PO) Take 1 tablet by mouth daily.   Yes [provider]  omeprazole (PRILOSEC) 10 MG capsule Take 10 mg by mouth daily. 06/25/18  Yes [provider]  furosemide (LASIX) 20 MG  tablet TAKE 1 TABLET BY MOUTH EVERY DAY Patient not taking: Reported on 07/12/2018 01/23/18   Skeet Latch, MD  nitroGLYCERIN (NITROSTAT) 0.4 MG SL tablet Place 1 tablet (0.4 mg total) under the tongue every 5 (five) minutes x 3 doses as needed for chest pain. Patient not taking: Reported on 07/12/2018 06/05/15   Leanor Kail, PA     Results for orders placed or performed during the hospital encounter of 07/12/18 (from the past 48 hour(s))  CBC with Differential/Platelet     Status: Abnormal   Collection Time: 07/12/18  5:03 PM  Result Value Ref Range   WBC 9.7 4.0 - 10.5 K/uL   RBC 4.36 3.87 - 5.11 MIL/uL   Hemoglobin 13.2 12.0 - 15.0 g/dL    HCT 41.0 36.0 - 46.0 %   MCV 94.0 80.0 - 100.0 fL   MCH 30.3 26.0 - 34.0 pg   MCHC 32.2 30.0 - 36.0 g/dL   RDW 12.7 11.5 - 15.5 %   Platelets 253 150 - 400 K/uL   nRBC 0.0 0.0 - 0.2 %   Neutrophils Relative % 82 %   Neutro Abs 7.9 (H) 1.7 - 7.7 K/uL   Lymphocytes Relative 12 %   Lymphs Abs 1.2 0.7 - 4.0 K/uL   Monocytes Relative 4 %   Monocytes Absolute 0.4 0.1 - 1.0 K/uL   Eosinophils Relative 1 %   Eosinophils Absolute 0.1 0.0 - 0.5 K/uL   Basophils Relative 1 %   Basophils Absolute 0.1 0.0 - 0.1 K/uL   Immature Granulocytes 0 %   Abs Immature Granulocytes 0.03 0.00 - 0.07 K/uL    Comment: Performed at [facility], [address], [place], [place] [address]  Comprehensive metabolic panel     Status: Abnormal   Collection Time: 07/12/18  5:03 PM  Result Value Ref Range   Sodium 137 135 - 145 mmol/L   Potassium 4.9 3.5 - 5.1 mmol/L   Chloride 102 98 - 111 mmol/L   CO2 22 22 - 32 mmol/L   Glucose, Bld 135 (H) 70 - 99 mg/dL   BUN 26 (H) 8 - 23 mg/dL   Creatinine, Ser 1.22 (H) 0.44 - 1.00 mg/dL   Calcium 9.6 8.9 - 10.3 mg/dL   Total Protein 7.3 6.5 - 8.1 g/dL   Albumin 3.6 3.5 - 5.0 g/dL   AST 177 (H) 15 - 41 U/L   ALT 98 (H) 0 - 44 U/L   Alkaline Phosphatase 109 38 - 126 U/L   Total Bilirubin 1.7 (H) 0.3 - 1.2 mg/dL   GFR calc non Af Amer 39 (L) >60 mL/min   GFR calc Af Amer 45 (L) >60 mL/min   Anion gap 13 5 - 15    Comment: Performed at [facility] [address], [place], [place] [address]  Troponin I - ONCE - STAT     Status: None   Collection Time: 07/12/18  5:03 PM  Result Value Ref Range   Troponin I <0.03 <0.03 ng/mL    Comment: Performed at [facility], [place] [address], [place], [place] [address]  Lipase, blood     Status: None   Collection Time: 07/12/18  5:03 PM  Result Value Ref Range   Lipase 33 11 - 51 U/L    Comment: Performed at [facility] [address] [place],  [address]  Urinalysis, Routine w reflex microscopic      Status: Abnormal   Collection  Time: 07/12/18  5:11 PM  Result Value Ref Range   Color, Urine YELLOW YELLOW   APPearance CLEAR CLEAR   Specific Gravity, Urine 1.016 1.005 - 1.030   pH 7.0 5.0 - 8.0   Glucose, UA NEGATIVE NEGATIVE mg/dL   Hgb urine dipstick NEGATIVE NEGATIVE   Bilirubin Urine NEGATIVE NEGATIVE   Ketones, ur NEGATIVE NEGATIVE mg/dL   Protein, ur 30 (A) NEGATIVE mg/dL   Nitrite NEGATIVE NEGATIVE   Leukocytes,Ua NEGATIVE NEGATIVE   RBC / HPF 0-5 0 - 5 RBC/hpf   WBC, UA 0-5 0 - 5 WBC/hpf   Bacteria, UA RARE (A) NONE SEEN    Comment: Performed at [facility] [address], [place], [place] [address]  SARS Coronavirus 2 (CEPHEID - Performed in [facility] lab), Hosp Order     Status: None   Collection Time: 07/12/18  8:17 PM  Result Value Ref Range   SARS Coronavirus 2 NEGATIVE NEGATIVE    Comment: (NOTE) If result is NEGATIVE SARS-CoV-2 target nucleic acids are NOT DETECTED. The SARS-CoV-2 RNA is generally detectable in upper and lower  respiratory specimens during the acute phase of infection. The lowest  concentration of SARS-CoV-2 viral copies this assay can detect is 250  copies / mL. A negative result does not preclude SARS-CoV-2 infection  and should not be used as the sole basis for treatment or other  patient management decisions.  A negative result may occur with  improper specimen collection / handling, submission of specimen other  than nasopharyngeal swab, presence of viral mutation(s) within the  areas targeted by this assay, and inadequate number of viral copies  (<250 copies / mL). A negative result must be combined with clinical  observations, patient history, and epidemiological information. If result is POSITIVE SARS-CoV-2 target nucleic acids are DETECTED. The SARS-CoV-2 RNA is generally detectable in upper and lower  respiratory specimens dur ing the acute phase of infection.  Positive  results are indicative of active  infection with SARS-CoV-2.  Clinical  correlation with patient history and other diagnostic information is  necessary to determine patient infection status.  Positive results do  not rule out bacterial infection or co-infection with other viruses. If result is PRESUMPTIVE POSTIVE SARS-CoV-2 nucleic acids MAY BE PRESENT.   A presumptive positive result was obtained on the submitted specimen  and confirmed on repeat testing.  While 2019 novel coronavirus  (SARS-CoV-2) nucleic acids may be present in the submitted sample  additional confirmatory testing may be necessary for epidemiological  and / or clinical management purposes  to differentiate between  SARS-CoV-2 and other Sarbecovirus currently known to infect humans.  If clinically indicated additional testing with an alternate test  methodology [phone]) is advised. The SARS-CoV-2 RNA is generally  detectable in upper and lower respiratory sp ecimens during the acute  phase of infection. The expected result is Negative. Fact Sheet for Patients:  StrictlyIdeas.no Fact Sheet for Healthcare Providers: BankingDealers.co.za This test is not yet approved or cleared by the [place] FDA and has been authorized for detection and/or diagnosis of SARS-CoV-2 by FDA under an Emergency Use Authorization (EUA).  This EUA will remain in effect (meaning this test can be used) for the duration of the COVID-19 declaration under Section 564(b)(1) of the Act, 21 U.S.C. section 360bbb-3(b)(1), unless the authorization is terminated or revoked sooner. Performed at [facility], [place] [address], [place],  [address]   Type and screen [place]  Status: None   Collection Time: 07/12/18  9:20 PM  Result Value Ref Range   ABO/RH(D) B POS    Antibody Screen NEG    Sample Expiration      07/15/2018 Performed at [facility], [place] [address], [place], [place]  [address]   Brain natriuretic peptide     Status: Abnormal   Collection Time: 07/12/18  9:25 PM  Result Value Ref Range   B Natriuretic Peptide 213.1 (H) 0.0 - 100.0 pg/mL    Comment: Performed at [facility] [address] [place], [place] [address]  Protime-INR     Status: None   Collection Time: 07/12/18  9:25 PM  Result Value Ref Range   Prothrombin Time 13.4 11.4 - 15.2 seconds   INR 1.0 0.8 - 1.2    Comment: (NOTE) INR goal varies based on device and disease states. Performed at [facility], [place] [address] [place], [place] [address]   APTT     Status: None   Collection Time: 07/12/18  9:25 PM  Result Value Ref Range   aPTT 28 24 - 36 seconds    Comment: Performed at [facility] [address] [place], [place] [address]    US Abdomen Complete  Result Date: 07/12/2018 CLINICAL DATA:  Abdominal pain since 2 p.m. today. EXAM: ABDOMEN ULTRASOUND COMPLETE COMPARISON:  None. FINDINGS: Gallbladder: Cholelithiasis. No gallbladder wall thickening. No pericholecystic fluid. No sonographic Murphy sign. Common bile duct: Diameter: 10.8 mm.  No choledocholithiasis. Liver: No focal hepatic lesion. Mild coarse hepatic echotexture with normal contour. Portal vein is patent on color Doppler imaging with normal direction of blood flow towards the liver. IVC: No abnormality visualized. Pancreas: Limited visualization secondary to overlying bowel gas. Spleen: Size and appearance within normal limits. Right Kidney: Length: 8.9 cm. Renal cortical thinning. Echogenicity within normal limits. No mass or hydronephrosis visualized. Left Kidney: Length: 7.8 cm. Renal cortical thinning. Echogenicity within normal limits. No mass or hydronephrosis visualized. Abdominal aorta: No aneurysm visualized. Other findings: None. IMPRESSION: 1. Cholelithiasis without sonographic evidence of acute cholecystitis. 2. Bilateral renal atrophy. Electronically Signed   By: Kathreen Devoid   On: 07/12/2018 18:13    Dg Chest Port 1 View  Result Date: 07/12/2018 CLINICAL DATA:  Per [facility] pt coming from home ([facility]) after having a sudden onset of mid epigastric pain radiating into back after gardening. Patient given 324 aspirin, 8mg  zofran and 2 nitro with no relief. Patient alert and orientated x 4. EXAM: PORTABLE CHEST - 1 VIEW COMPARISON:  06/02/2015 FINDINGS: Lungs are clear. Heart size upper limits normal. Aortic Atherosclerosis (ICD10-170.0). No effusion. Visualized bones unremarkable. IMPRESSION: No acute cardiopulmonary disease. Electronically Signed   By: Lucrezia Europe M.D.   On: 07/12/2018 17:18   Ct Angio Chest/abd/pel For Dissection W And/or Wo Contrast  Result Date: 07/12/2018 CLINICAL DATA:  Sudden onset of epigastric pain radiating into back after gardening. EXAM: CT ANGIOGRAPHY CHEST, ABDOMEN AND PELVIS TECHNIQUE: Multidetector CT imaging through the chest, abdomen and pelvis was performed using the standard protocol during bolus administration of intravenous contrast. Multiplanar reconstructed images and MIPs were obtained and reviewed to evaluate the vascular anatomy. CONTRAST:  42mL OMNIPAQUE IOHEXOL 350 MG/ML SOLN COMPARISON:  Chest x-ray Jul 12, 2018 FINDINGS: CTA CHEST FINDINGS Cardiovascular: Mild cardiomegaly. Coronary artery calcifications involve the LAD and circumflex coronary arteries. Visualized pulmonary arteries are normal in appearance with no filling defects or pulmonary emboli noted. Mild scattered  atherosclerotic changes are seen in the thoracic aorta. No aneurysm or dissection in the thoracic aorta. Mediastinum/Nodes: No enlarged mediastinal, hilar, or axillary lymph nodes. Thyroid gland, trachea, and esophagus demonstrate no significant findings. Lungs/Pleura: Mild scarring in the medial apices bilaterally. No pneumothorax. Central airways are normal. There is a nodule in the lateral left lung base measuring 7 mm in mean diameter. No other nodules. No masses or  infiltrates. Musculoskeletal: See below. Review of the MIP images confirms the above findings. CTA ABDOMEN AND PELVIS FINDINGS VASCULAR Aorta: The abdominal aorta is tortuous without aneurysm or dissection. Celiac: Mild atherosclerotic changes are seen near the origin of the celiac artery with no narrowing. SMA: Patent without evidence of aneurysm, dissection, vasculitis or significant stenosis. Renals: Both renal arteries are patent without evidence of aneurysm, dissection, vasculitis, fibromuscular dysplasia or significant stenosis. IMA: Patent without evidence of aneurysm, dissection, vasculitis or significant stenosis. Inflow: Patent without evidence of aneurysm, dissection, vasculitis or significant stenosis. Veins: No obvious venous abnormality within the limitations of this arterial phase study. The iliac and proximal femoral arteries are normal in appearance with mild scattered atherosclerotic changes. Review of the MIP images confirms the above findings. NON-VASCULAR Hepatobiliary: Evaluation is limited due to arterial phase imaging. No liver masses are identified. The portal vein is not well assessed due to timing of contrast. The gallbladder is distended. A rounded region of high attenuation in the gallbladder is likely a stone or tumefactive sludge measuring up to 3.3 cm. No wall thickening or pericholecystic fluid noted. Pancreas: There is fatty infiltration of the pancreatic head of no acute significance. No suspicious masses identified or evidence of pancreatitis. Spleen: Normal in size without focal abnormality. Adrenals/Urinary Tract: Adrenal glands are normal. Bilateral renal cysts are identified. No hydronephrosis or acute perinephric stranding. No ureteral stones. The bladder is normal. Stomach/Bowel: The stomach is normal. A duodenal diverticulum is identified off the second portion of the duodenum without evidence of inflammation, of no acute significance. The small bowel is otherwise normal.  Colonic diverticulosis is seen without diverticulitis. The appendix is normal. Lymphatic: No significant vascular findings are present. No enlarged abdominal or pelvic lymph nodes. Reproductive: Status post hysterectomy. No adnexal masses. Other: No abdominal wall hernia or abnormality. No abdominopelvic ascites. Musculoskeletal: L4 pars defects are noted with grade 2 anterolisthesis of L4 versus L5. No other malalignment. Severe degenerative changes in the lumbar spine with more mild degenerative changes in the thoracic spine. Lower lumbar facet degenerative changes identified. Other bones are unremarkable. Review of the MIP images confirms the above findings. IMPRESSION: 1. No aortic aneurysm or dissection. 2. The gallbladder is somewhat distended. A rounded region of high attenuation in the gallbladder measuring 3.3 cm is likely a stone or tumefactive sludge. No wall thickening or pericholecystic fluid. If there is clinical concern for acute cholecystitis, recommend ultrasound. 3. Significant degenerative changes in the lumbar spine. Scattered degenerative changes seen in the thoracic spine as well. 4. 7 mm nodule in the left lower lobe. Non-contrast chest CT at 6-12 months is recommended. If the nodule is stable at time of repeat CT, then future CT at 18-24 months (from today's scan) is considered optional for low-risk patients, but is recommended for high-risk patients. This recommendation follows the consensus statement: Guidelines for Management of Incidental Pulmonary Nodules Detected on CT Images: From the Fleischner Society 2017; Radiology 2017; 284:228-243. 5. Coronary artery calcifications in the LAD and circumflex arteries. 6. Colonic diverticulosis without diverticulitis. Electronically Signed   By: Shanon Brow  Mee Hives M.D   On: 07/12/2018 19:59    Review of Systems  Constitutional: Negative for weight loss.  HENT: Negative for ear discharge, ear pain, hearing loss and tinnitus.   Eyes: Negative  for blurred vision, double vision, photophobia and pain.  Respiratory: Negative for cough, sputum production and shortness of breath.   Cardiovascular: Negative for chest pain.  Gastrointestinal: Positive for abdominal pain, nausea and vomiting.  Genitourinary: Negative for dysuria, flank pain, frequency and urgency.  Musculoskeletal: Negative for back pain, falls, joint pain, myalgias and neck pain.  Neurological: Negative for dizziness, tingling, sensory change, focal weakness, loss of consciousness and headaches.  Endo/Heme/Allergies: Does not bruise/bleed easily.  Psychiatric/Behavioral: Negative for depression, memory loss and substance abuse. The patient is not nervous/anxious.    Blood pressure (!) 161/59, pulse 86, temperature 97.6 F (36.4 C), temperature source Oral, resp. rate 16, height 4\' 11"  (1.499 m), weight 71.2 kg, SpO2 94 %. Physical Exam WDWN in NAD Eyes:  Pupils equal, round; sclera anicteric HENT:  Oral mucosa moist; good dentition  Neck:  No masses palpated, no thyromegaly Lungs:  CTA bilaterally; normal respiratory effort CV:  Regular rate and rhythm; no murmurs; extremities well-perfused with some leg edema Abd:  +bowel sounds, soft, RUQ tenderness, no palpable organomegaly; no palpable hernias Skin:  Warm, dry; no sign of jaundice Psychiatric - alert and oriented x 4; calm mood and affect  Assessment/Plan: Symptomatic cholelithiasis Elevated risk for laparoscopic cholecystectomy, but not prohibitive risk  Would treat with bowel rest, IV hydration. If symptoms continue, consider HIDA scan to make sure she has cholecystitis. Will follow-up tomorrow.  Imogene Burn Danielys Madry 07/12/2018, 11:02 PM

## 2018-07-12 NOTE — H&P (Addendum)
History and Physical    April Hodges BJS:[id number] DOB: 23-Oct-1925 DOA: 07/12/2018  Referring MD/NP/PA:   PCP: Lajean Manes, MD   Patient coming from:  The patient is coming from independent living facility at baseline, pt is partially dependent for most of ADL.        Chief Complaint: Abdominal pain, nausea vomiting  HPI: April Hodges is a 85 y.o. female with medical history significant of hypertension, hyperlipidemia, CKD stage III, GERD, hypothyroidism, sinus bradycardia, dCHF, who presents with abdominal pain, nausea vomiting.  Patient states that her symptoms started suddenly at about 2 PM, including abdominal pain, nausea vomiting.  Her abdominal pain is located in right upper quadrant and also epigastric area, constant, sharp, severe, radiating to the right flank and back area.  She has nausea and and vomited few times with a small amount of nonbilious and nonbloody vomitus.  Patient denies diarrhea.  No fever or chills.  Patient does not have chest pain, shortness breath, cough.  Denies symptoms of UTI or unilateral weakness. She as given 324 aspirin and 2 nitros with no relief. EMS noted that she was bradycardic but has a history of bradycardia.   her HR is 45-->90s-->70s in ED.  ED Course: pt was found to have negative COVID-19 test, WBC 9.7, INR 1.1, PTT 28, negative urinalysis, stable renal function, temperature normal, elevated blood pressure with SBP 180s-->146/82, no tachycardia, oxygen saturation 94% on room air.  Chest x-ray negative. Pt is admitted to tele bed as inpt. Dr. Georgette Dover of general surgery was consulted by EDP.  CTA of chest/abd/pelvis: 1. No aortic aneurysm or dissection. 2. The gallbladder is somewhat distended. A rounded region of high attenuation in the gallbladder measuring 3.3 cm is likely a stone or tumefactive sludge. No wall thickening or pericholecystic fluid. 3. Significant degenerative changes in the lumbar spine. Scattered degenerative changes seen in the  thoracic spine as well. 4. 7 mm nodule in the left lower lobe.  5. Coronary artery calcifications in the LAD and circumflex arteries. 6. Colonic diverticulosis without diverticulitis.  US-abdomen: 1. Cholelithiasis without sonographic evidence of acute cholecystitis. 2. Bilateral renal atrophy.  Review of Systems:   General: no fevers, chills, no body weight gain, has poor appetite, has fatigue HEENT: no blurry vision, hearing changes or sore throat Respiratory: no dyspnea, coughing, wheezing CV: no chest pain, no palpitations GI: has nausea, vomiting, abdominal pain, no diarrhea, constipation GU: no dysuria, burning on urination, increased urinary frequency, hematuria  Ext: has mild leg edema Neuro: no unilateral weakness, numbness, or tingling, no vision change or hearing loss Skin: no rash, no skin tear. MSK: No muscle spasm, no deformity, no limitation of range of movement in spin Heme: No easy bruising.  Travel history: No recent long distant travel.  Allergy:  Allergies  Allergen Reactions   Amlodipine Swelling    Coughing as well   Bystolic [Nebivolol Hcl] Other (See Comments)    Bradycardia - HR in the 40s    Past Medical History:  Diagnosis Date   Acute on chronic renal failure ([place]) 06/03/2015   Arthritis    Cancer ([facility])    skin cancer   CKD (chronic kidney disease), stage III ([facility])    Demand ischemia ([facility])    a. 05/2015 in setting of sepsis w/ troponin peak of 8.47;  b. 05/2015 Echo: EF 60-65%, no rwma, Gr1 DD-->No ischemic eval undertaken.   Essential hypertension 06/03/2015   GERD (gastroesophageal reflux disease)    Headache  in younger years   Hypothyroidism    Pneumonia    as a child   PONV (postoperative nausea and vomiting)    Sinus bradycardia    a. states rates in 40's and low 50's are normal for her-->No AVN blocking agents.   Systolic murmur    a. 08/3843 No sigificant valvular dzs on echo.   Urosepsis 06/03/2015    Past  Surgical History:  Procedure Laterality Date   BILATERAL CARPAL TUNNEL RELEASE     COLONOSCOPY     EYE SURGERY Bilateral    cataracts removed and lens implant   JOINT REPLACEMENT Bilateral    knees   LUMBAR LAMINECTOMY/DECOMPRESSION MICRODISCECTOMY N/A 06/10/2014   Procedure: LUMBAR DECOMPRESSION L3 - L5 2 LEVELS;  Surgeon: Melina Schools, MD;  Location: [facility];  Service: Orthopedics;  Laterality: N/A;   TONSILLECTOMY     VAGINAL HYSTERECTOMY      Social History:  reports that she has never smoked. She has never used smokeless tobacco. She reports current alcohol use. She reports that she does not use drugs.  Family History:  Family History  Problem Relation Age of Onset   Congestive Heart Failure Mother    Heart attack Father      Prior to Admission medications   Medication Sig Start Date End Date Taking? Authorizing Provider  acetaminophen (TYLENOL) 500 MG tablet Take 500 mg by mouth See admin instructions. 500mg  by mouth every morning and daily as needed for pain   Yes [provider]  diltiazem (CARDIZEM CD) 120 MG 24 hr capsule Take 120 mg by mouth daily. 06/25/18  Yes [provider]  levothyroxine (SYNTHROID, LEVOTHROID) 125 MCG tablet Take 1 tablet (125 mcg total) by mouth daily. 06/05/15  Yes Bhagat, Bhavinkumar, PA  lisinopril (PRINIVIL,ZESTRIL) 20 MG tablet Take 1 tablet (20 mg total) by mouth daily. NEED OV. 02/25/18  Yes Skeet Latch, MD  Multiple Vitamins-Minerals (MULTIVITAMIN PO) Take 1 tablet by mouth daily.   Yes [provider]  omeprazole (PRILOSEC) 10 MG capsule Take 10 mg by mouth daily. 06/25/18  Yes [provider]  furosemide (LASIX) 20 MG tablet TAKE 1 TABLET BY MOUTH EVERY DAY Patient not taking: Reported on 07/12/2018 01/23/18   Skeet Latch, MD  nitroGLYCERIN (NITROSTAT) 0.4 MG SL tablet Place 1 tablet (0.4 mg total) under the tongue every 5 (five) minutes x 3 doses as needed for chest pain. Patient not  taking: Reported on 07/12/2018 06/05/15   Leanor Kail, [place]    Physical Exam: Vitals:   07/12/18 1730 07/12/18 1818 07/12/18 2000 07/12/18 2030  BP: (!) 179/136 (!) 175/73 (!) 146/82 (!) 190/72  Pulse: (!) 56 (!) 59 96 74  Resp: 15 16    Temp:      TempSrc:      SpO2: 99% 96% 94% 92%  Weight:      Height:       General: Not in acute distress HEENT:       Eyes: PERRL, EOMI, no scleral icterus.       ENT: No discharge from the ears and nose, no pharynx injection, no tonsillar enlargement.        Neck: No JVD, no bruit, no mass felt. Heme: No neck lymph node enlargement. Cardiac: S1/S2, RRR, No murmurs, No gallops or rubs. Respiratory: No rales, wheezing, rhonchi or rubs. GI: Soft, nondistended, has tenderness in RUQ and epigastric area, no rebound pain, no organomegaly, BS present. GU: No hematuria Ext: has trace leg edema bilaterally.  Has varicose veins in legs. 2+DP/PT pulse bilaterally. Musculoskeletal: No joint deformities, No joint redness or warmth, no limitation of ROM in spin. Skin: No rashes.  Neuro: Alert, oriented X3, cranial nerves II-XII grossly intact, moves all extremities normally.  Psych: Patient is not psychotic, no suicidal or hemocidal ideation.  Labs on Admission: I have personally reviewed following labs and imaging studies  CBC: Recent Labs  Lab 07/12/18 1703  WBC 9.7  NEUTROABS 7.9*  HGB 13.2  HCT 41.0  MCV 94.0  PLT 401   Basic Metabolic Panel: Recent Labs  Lab 07/12/18 1703  NA 137  K 4.9  CL 102  CO2 22  GLUCOSE 135*  BUN 26*  CREATININE 1.22*  CALCIUM 9.6   GFR: Estimated Creatinine Clearance: 26.3 mL/min (A) (by C-G formula based on SCr of 1.22 mg/dL (H)). Liver Function Tests: Recent Labs  Lab 07/12/18 1703  AST 177*  ALT 98*  ALKPHOS 109  BILITOT 1.7*  PROT 7.3  ALBUMIN 3.6   Recent Labs  Lab 07/12/18 1703  LIPASE 33   No results for input(s): AMMONIA in the last 168 hours. Coagulation Profile: Recent Labs    Lab 07/12/18 2125  INR 1.0   Cardiac Enzymes: Recent Labs  Lab 07/12/18 1703  TROPONINI <0.03   BNP (last 3 results) No results for input(s): PROBNP in the last 8760 hours. HbA1C: No results for input(s): HGBA1C in the last 72 hours. CBG: No results for input(s): GLUCAP in the last 168 hours. Lipid Profile: No results for input(s): CHOL, HDL, LDLCALC, TRIG, CHOLHDL, LDLDIRECT in the last 72 hours. Thyroid Function Tests: No results for input(s): TSH, T4TOTAL, FREET4, T3FREE, THYROIDAB in the last 72 hours. Anemia Panel: No results for input(s): VITAMINB12, FOLATE, FERRITIN, TIBC, IRON, RETICCTPCT in the last 72 hours. Urine analysis:    Component Value Date/Time   COLORURINE YELLOW 07/12/2018 [place] 07/12/2018 1711   LABSPEC 1.016 07/12/2018 1711   PHURINE 7.0 07/12/2018 1711   GLUCOSEU NEGATIVE 07/12/2018 1711   HGBUR NEGATIVE 07/12/2018 1711   BILIRUBINUR NEGATIVE 07/12/2018 1711   KETONESUR NEGATIVE 07/12/2018 1711   PROTEINUR 30 (A) 07/12/2018 1711   NITRITE NEGATIVE 07/12/2018 1711   LEUKOCYTESUR NEGATIVE 07/12/2018 1711   Sepsis Labs: @LABRCNTIP (procalcitonin:4,lacticidven:4) ) Recent Results (from the past 240 hour(s))  SARS Coronavirus 2 (CEPHEID - Performed in [facility] hospital lab), Hosp Order     Status: None   Collection Time: 07/12/18  8:17 PM  Result Value Ref Range Status   SARS Coronavirus 2 NEGATIVE NEGATIVE Final    Comment: (NOTE) If result is NEGATIVE SARS-CoV-2 target nucleic acids are NOT DETECTED. The SARS-CoV-2 RNA is generally detectable in upper and lower  respiratory specimens during the acute phase of infection. The lowest  concentration of SARS-CoV-2 viral copies this assay can detect is 250  copies / mL. A negative result does not preclude SARS-CoV-2 infection  and should not be used as the sole basis for treatment or other  patient management decisions.  A negative result may occur with  improper specimen  collection / handling, submission of specimen other  than nasopharyngeal swab, presence of viral mutation(s) within the  areas targeted by this assay, and inadequate number of viral copies  (<250 copies / mL). A negative result must be combined with clinical  observations, patient history, and epidemiological information. If result is POSITIVE SARS-CoV-2 target nucleic acids are DETECTED. The SARS-CoV-2 RNA is generally detectable in upper and lower  respiratory  specimens dur ing the acute phase of infection.  Positive  results are indicative of active infection with SARS-CoV-2.  Clinical  correlation with patient history and other diagnostic information is  necessary to determine patient infection status.  Positive results do  not rule out bacterial infection or co-infection with other viruses. If result is PRESUMPTIVE POSTIVE SARS-CoV-2 nucleic acids MAY BE PRESENT.   A presumptive positive result was obtained on the submitted specimen  and confirmed on repeat testing.  While 2019 novel coronavirus  (SARS-CoV-2) nucleic acids may be present in the submitted sample  additional confirmatory testing may be necessary for epidemiological  and / or clinical management purposes  to differentiate between  SARS-CoV-2 and other Sarbecovirus currently known to infect humans.  If clinically indicated additional testing with an alternate test  methodology [phone]) is advised. The SARS-CoV-2 RNA is generally  detectable in upper and lower respiratory sp ecimens during the acute  phase of infection. The expected result is Negative. Fact Sheet for Patients:  StrictlyIdeas.no Fact Sheet for Healthcare Providers: BankingDealers.co.za This test is not yet approved or cleared by the [place] FDA and has been authorized for detection and/or diagnosis of SARS-CoV-2 by FDA under an Emergency Use Authorization (EUA).  This EUA will remain in effect  (meaning this test can be used) for the duration of the COVID-19 declaration under Section 564(b)(1) of the Act, 21 U.S.C. section 360bbb-3(b)(1), unless the authorization is terminated or revoked sooner. Performed at [facility], [place] [address], [place], [place] [address]      Radiological Exams on Admission: US Abdomen Complete  Result Date: 07/12/2018 CLINICAL DATA:  Abdominal pain since 2 p.m. today. EXAM: ABDOMEN ULTRASOUND COMPLETE COMPARISON:  None. FINDINGS: Gallbladder: Cholelithiasis. No gallbladder wall thickening. No pericholecystic fluid. No sonographic Murphy sign. Common bile duct: Diameter: 10.8 mm.  No choledocholithiasis. Liver: No focal hepatic lesion. Mild coarse hepatic echotexture with normal contour. Portal vein is patent on color Doppler imaging with normal direction of blood flow towards the liver. IVC: No abnormality visualized. Pancreas: Limited visualization secondary to overlying bowel gas. Spleen: Size and appearance within normal limits. Right Kidney: Length: 8.9 cm. Renal cortical thinning. Echogenicity within normal limits. No mass or hydronephrosis visualized. Left Kidney: Length: 7.8 cm. Renal cortical thinning. Echogenicity within normal limits. No mass or hydronephrosis visualized. Abdominal aorta: No aneurysm visualized. Other findings: None. IMPRESSION: 1. Cholelithiasis without sonographic evidence of acute cholecystitis. 2. Bilateral renal atrophy. Electronically Signed   By: Kathreen Devoid   On: 07/12/2018 18:13   Dg Chest Port 1 View  Result Date: 07/12/2018 CLINICAL DATA:  Per [facility] pt coming from home ([facility]) after having a sudden onset of mid epigastric pain radiating into back after gardening. Patient given 324 aspirin, 8mg  zofran and 2 nitro with no relief. Patient alert and orientated x 4. EXAM: PORTABLE CHEST - 1 VIEW COMPARISON:  06/02/2015 FINDINGS: Lungs are clear. Heart size upper limits normal. Aortic Atherosclerosis  (ICD10-170.0). No effusion. Visualized bones unremarkable. IMPRESSION: No acute cardiopulmonary disease. Electronically Signed   By: Lucrezia Europe M.D.   On: 07/12/2018 17:18   Ct Angio Chest/abd/pel For Dissection W And/or Wo Contrast  Result Date: 07/12/2018 CLINICAL DATA:  Sudden onset of epigastric pain radiating into back after gardening. EXAM: CT ANGIOGRAPHY CHEST, ABDOMEN AND PELVIS TECHNIQUE: Multidetector CT imaging through the chest, abdomen and pelvis was performed using the standard protocol during bolus administration of intravenous contrast. Multiplanar reconstructed images and MIPs were obtained  and reviewed to evaluate the vascular anatomy. CONTRAST:  37mL OMNIPAQUE IOHEXOL 350 MG/ML SOLN COMPARISON:  Chest x-ray Jul 12, 2018 FINDINGS: CTA CHEST FINDINGS Cardiovascular: Mild cardiomegaly. Coronary artery calcifications involve the LAD and circumflex coronary arteries. Visualized pulmonary arteries are normal in appearance with no filling defects or pulmonary emboli noted. Mild scattered atherosclerotic changes are seen in the thoracic aorta. No aneurysm or dissection in the thoracic aorta. Mediastinum/Nodes: No enlarged mediastinal, hilar, or axillary lymph nodes. Thyroid gland, trachea, and esophagus demonstrate no significant findings. Lungs/Pleura: Mild scarring in the medial apices bilaterally. No pneumothorax. Central airways are normal. There is a nodule in the lateral left lung base measuring 7 mm in mean diameter. No other nodules. No masses or infiltrates. Musculoskeletal: See below. Review of the MIP images confirms the above findings. CTA ABDOMEN AND PELVIS FINDINGS VASCULAR Aorta: The abdominal aorta is tortuous without aneurysm or dissection. Celiac: Mild atherosclerotic changes are seen near the origin of the celiac artery with no narrowing. SMA: Patent without evidence of aneurysm, dissection, vasculitis or significant stenosis. Renals: Both renal arteries are patent without evidence of  aneurysm, dissection, vasculitis, fibromuscular dysplasia or significant stenosis. IMA: Patent without evidence of aneurysm, dissection, vasculitis or significant stenosis. Inflow: Patent without evidence of aneurysm, dissection, vasculitis or significant stenosis. Veins: No obvious venous abnormality within the limitations of this arterial phase study. The iliac and proximal femoral arteries are normal in appearance with mild scattered atherosclerotic changes. Review of the MIP images confirms the above findings. NON-VASCULAR Hepatobiliary: Evaluation is limited due to arterial phase imaging. No liver masses are identified. The portal vein is not well assessed due to timing of contrast. The gallbladder is distended. A rounded region of high attenuation in the gallbladder is likely a stone or tumefactive sludge measuring up to 3.3 cm. No wall thickening or pericholecystic fluid noted. Pancreas: There is fatty infiltration of the pancreatic head of no acute significance. No suspicious masses identified or evidence of pancreatitis. Spleen: Normal in size without focal abnormality. Adrenals/Urinary Tract: Adrenal glands are normal. Bilateral renal cysts are identified. No hydronephrosis or acute perinephric stranding. No ureteral stones. The bladder is normal. Stomach/Bowel: The stomach is normal. A duodenal diverticulum is identified off the second portion of the duodenum without evidence of inflammation, of no acute significance. The small bowel is otherwise normal. Colonic diverticulosis is seen without diverticulitis. The appendix is normal. Lymphatic: No significant vascular findings are present. No enlarged abdominal or pelvic lymph nodes. Reproductive: Status post hysterectomy. No adnexal masses. Other: No abdominal wall hernia or abnormality. No abdominopelvic ascites. Musculoskeletal: L4 pars defects are noted with grade 2 anterolisthesis of L4 versus L5. No other malalignment. Severe degenerative changes in  the lumbar spine with more mild degenerative changes in the thoracic spine. Lower lumbar facet degenerative changes identified. Other bones are unremarkable. Review of the MIP images confirms the above findings. IMPRESSION: 1. No aortic aneurysm or dissection. 2. The gallbladder is somewhat distended. A rounded region of high attenuation in the gallbladder measuring 3.3 cm is likely a stone or tumefactive sludge. No wall thickening or pericholecystic fluid. If there is clinical concern for acute cholecystitis, recommend ultrasound. 3. Significant degenerative changes in the lumbar spine. Scattered degenerative changes seen in the thoracic spine as well. 4. 7 mm nodule in the left lower lobe. Non-contrast chest CT at 6-12 months is recommended. If the nodule is stable at time of repeat CT, then future CT at 18-24 months (from today's scan) is considered optional  for low-risk patients, but is recommended for high-risk patients. This recommendation follows the consensus statement: Guidelines for Management of Incidental Pulmonary Nodules Detected on CT Images: From the Fleischner Society 2017; Radiology 2017; 284:228-243. 5. Coronary artery calcifications in the LAD and circumflex arteries. 6. Colonic diverticulosis without diverticulitis. Electronically Signed   By: Dorise Bullion III M.D   On: 07/12/2018 19:59     EKG: Independently reviewed.  Sinus rhythm, QTC 449, poor R wave progression, bifascicular block.  Assessment/Plan Principal Problem:   Biliary colic Active Problems:   Essential hypertension   Hypothyroidism   CKD (chronic kidney disease), stage III ([facility])   Cholelithiasis   Chronic diastolic CHF (congestive heart failure) ([facility])   Lung nodule   Biliary colic pain and cholelithiasis: Dr. Georgette Dover of general surgeon was consulted, "He states that patient is 85 years old so recommend IV abx and medicine admission. Will keep NPO for now and surgery will see patient as consult for biliary  colic".  -admit to tele bed as inpt -zosyn was started in ED-->will continue -prn morphine and Percocet for pain -PRN Zofran for nausea -IVF: NS at 75 cc/h - INR/PTT/type & screen -f/u surgeon's recommendation -keep NPO  Addendum: Dr. Johnny Bridge suggested to consider HIDA to rule out cholecystitis, which is ordered.  Essential hypertension: -IV Hydralazine prn -Continue home medications: Lisinopril -Continue Cardizem (with holding parameters for HR<60)  Hypothyroidism; -Continue Synthroid  CKD (chronic kidney disease), stage III ([place]): stable.  Baseline creatinine 1.0-1.3.  Her creatinine is 1.22, BUN 26. -Follow-up by BMP  Chronic diastolic CHF (congestive heart failure) ([place]): 2D echo 11/28/2017 showed EF 60 to 65% with grade 1 diastolic dysfunction.  Patient has trace amount of leg edema, but no JVD.  No shortness of breath.  CHF is compensated. -Check BMP -Watch volume status closely  Lung nodule: Incidental findings on CTA She will follow-up with PCP      Inpatient status:  # Patient requires inpatient status due to high intensity of service, high risk for further deterioration and high frequency of surveillance required.  I certify that at the point of admission it is my clinical judgment that the patient will require inpatient hospital care spanning beyond 2 midnights from the point of admission.   This patient has multiple chronic comorbidities including  hypertension, hyperlipidemia, CKD stage III, GERD, hypothyroidism, sinus bradycardia, dCHF  Now patient has presenting with abdominal pain, nausea vomiting due to biliary colic and cholelithiasis  The worrisome physical exam findings include abdominal tenderness over right upper quadrant and epigastric area  The initial radiographic and laboratory data are worrisome because of cholelithiasis by CT angiogram and abdominal ultrasound.  Current medical needs: please see my assessment and plan  Predictability of an  adverse outcome (risk): Patient has multiple comorbidities and old age, now presents with abdominal pain due to cholelithiasis.  She is at risk of developing cholecystitis.  Given her old age, she is done high risk of deteriorating, and may need surgery.  Will need to be treated in the hospital for at least 2 days.       DVT ppx: SCD Code Status: Full code Family Communication: None at bed side.    Disposition Plan:  Anticipate discharge back to previous independent living facility  consults called: Dr. Georgette Dover of general surgeon Admission status:   Inpatient/tele     Date of Service 07/12/2018    [facility]   If 7PM-7AM, please contact night-coverage [email or web address] Password [facility] 07/12/2018, 10:34 PM

## 2018-07-12 NOTE — ED Triage Notes (Signed)
Per [facility] pt coming from home ([facility]) after having a sudden onset of mid epigastric pain radiating into back after gardening. Patient given 324 aspirin, 8mg  zofran and 2 nitro with no relief. Patient alert and orientated x 4.

## 2018-07-12 NOTE — Progress Notes (Signed)
New Admission Note:  Arrival Method: By bed from ED around 2230 Mental Orientation: Alert and oriented Telemetry: Box 6, CCMD notified Assessment: Completed Skin: Completed, refer to flowsheets IV: Left forearm NS at 75 Pain: Denies Tubes: None Safety Measures: Safety Fall Prevention Plan was given, discussed  Admission: Completed [facility] Orientation: Patient has been orientated to the room, unit and the staff. Family: None  Orders have been reviewed and implemented. Will continue to monitor the patient. Call light has been placed within reach and low bed alarm has been activated.   Perry Mount, RN  Phone Number: [phone]

## 2018-07-12 NOTE — ED Notes (Signed)
Patient transported to CT 

## 2018-07-12 NOTE — ED Notes (Signed)
X-ray at bedside

## 2018-07-12 NOTE — ED Provider Notes (Signed)
[facility] EMERGENCY DEPARTMENT Provider Note   CSN: [id number] Arrival date & time: 07/12/18  1654    History   Chief Complaint Chief Complaint  Patient presents with   Chest Pain    HPI April Hodges is a 85 y.o. female history of CKD, hypertension, previous demand ischemia after sepsis, here presenting with right sided abdominal pain, right flank pain, epigastric pain.  Patient states that April Hodges was gardening and had a sudden onset of epigastric pain that is sharp and radiated to her back.  April Hodges felt very nauseated at that time.  EMS was called and April Hodges was given 324 aspirin and a milligram of Zofran and 2 nitros with no relief.  States that April Hodges had a similar episode about a week ago but did not call EMS.  April Hodges never had any abdominal surgeries previously.  Denies any urinary symptoms or fevers or chest pain or shortness of breath. EMS noted that April Hodges was bradycardic but has a history of bradycardia.      The history is provided by the patient.    Past Medical History:  Diagnosis Date   Acute on chronic renal failure ([place]) 06/03/2015   Arthritis    Cancer ([place])    skin cancer   CKD (chronic kidney disease), stage III ([place])    Demand ischemia ([place])    a. 05/2015 in setting of sepsis w/ troponin peak of 8.47;  b. 05/2015 Echo: EF 60-65%, no rwma, Gr1 DD-->No ischemic eval undertaken.   Essential hypertension 06/03/2015   GERD (gastroesophageal reflux disease)    Headache    in younger years   Hypothyroidism    Pneumonia    as a child   PONV (postoperative nausea and vomiting)    Sinus bradycardia    a. states rates in 40's and low 50's are normal for her-->No AVN blocking agents.   Systolic murmur    a. 11/3788 No sigificant valvular dzs on echo.   Urosepsis 06/03/2015    Patient Active Problem List   Diagnosis Date Noted   Hypothyroidism    Sinus bradycardia    CKD (chronic kidney disease), stage III ([place])    Demand ischemia ([place])  06/03/2015   Acute on chronic renal failure ([place]) 06/03/2015   Essential hypertension 06/03/2015   UTI (urinary tract infection) 06/03/2015   Back pain 06/10/2014    Past Surgical History:  Procedure Laterality Date   BILATERAL CARPAL TUNNEL RELEASE     COLONOSCOPY     EYE SURGERY Bilateral    cataracts removed and lens implant   JOINT REPLACEMENT Bilateral    knees   LUMBAR LAMINECTOMY/DECOMPRESSION MICRODISCECTOMY N/A 06/10/2014   Procedure: LUMBAR DECOMPRESSION L3 - L5 2 LEVELS;  Surgeon: Melina Schools, MD;  Location: [facility];  Service: Orthopedics;  Laterality: N/A;   TONSILLECTOMY     VAGINAL HYSTERECTOMY       OB History   No obstetric history on file.      Home Medications    Prior to Admission medications   Medication Sig Start Date End Date Taking? Authorizing Provider  acetaminophen (TYLENOL) 500 MG tablet Take 500 mg by mouth See admin instructions. 500mg  by mouth every morning and daily as needed for pain   Yes [provider]  diltiazem (CARDIZEM CD) 120 MG 24 hr capsule Take 120 mg by mouth daily. 06/25/18  Yes [provider]  levothyroxine (SYNTHROID, LEVOTHROID) 125 MCG tablet Take 1 tablet (125 mcg total) by mouth daily. 06/05/15  Yes Bhagat, Bhavinkumar, PA  lisinopril (PRINIVIL,ZESTRIL) 20 MG tablet Take 1 tablet (20 mg total) by mouth daily. NEED OV. 02/25/18  Yes Skeet Latch, MD  Multiple Vitamins-Minerals (MULTIVITAMIN PO) Take 1 tablet by mouth daily.   Yes [provider]  omeprazole (PRILOSEC) 10 MG capsule Take 10 mg by mouth daily. 06/25/18  Yes [provider]  furosemide (LASIX) 20 MG tablet TAKE 1 TABLET BY MOUTH EVERY DAY Patient not taking: Reported on 07/12/2018 01/23/18   Skeet Latch, MD  nitroGLYCERIN (NITROSTAT) 0.4 MG SL tablet Place 1 tablet (0.4 mg total) under the tongue every 5 (five) minutes x 3 doses as needed for chest pain. Patient not taking: Reported on 07/12/2018 06/05/15   Leanor Kail, PA    Family History Family History  Problem Relation Age of Onset   Congestive Heart Failure Mother    Heart attack Father     Social History Social History   Tobacco Use   Smoking status: Never Smoker   Smokeless tobacco: Never Used  Substance Use Topics   Alcohol use: Yes    Comment: occ wine   Drug use: No     Allergies   Amlodipine and Bystolic [nebivolol hcl]   Review of Systems Review of Systems  Cardiovascular: Positive for chest pain.  Gastrointestinal: Positive for abdominal pain and nausea.  All other systems reviewed and are negative.    Physical Exam Updated Vital Signs BP (!) 146/82    Pulse 96    Temp 97.8 F (36.6 C) (Oral)    Resp 16    Ht 4\' 11"  (1.499 m)    Wt 71.2 kg    SpO2 94%    BMI 31.71 kg/m   Physical Exam Vitals signs and nursing note reviewed.  HENT:     Head: Normocephalic.  Eyes:     Extraocular Movements: Extraocular movements intact.     Pupils: Pupils are equal, round, and reactive to light.  Neck:     Musculoskeletal: Normal range of motion and neck supple.  Cardiovascular:     Rate and Rhythm: Normal rate and regular rhythm.     Heart sounds: Normal heart sounds.  Pulmonary:     Effort: Pulmonary effort is normal.     Breath sounds: Normal breath sounds.  Abdominal:     Comments: + Epigastric and RUQ tenderness, mild murphy sign. No CVAT   Musculoskeletal: Normal range of motion.  Skin:    General: Skin is warm.     Capillary Refill: Capillary refill takes less than 2 seconds.  Neurological:     General: No focal deficit present.     Mental Status: April Hodges is alert and oriented to person, place, and time.  Psychiatric:        Mood and Affect: Mood normal.        Behavior: Behavior normal.      ED Treatments / Results  Labs (all labs ordered are listed, but only abnormal results are displayed) Labs Reviewed  CBC WITH DIFFERENTIAL/PLATELET - Abnormal; Notable for the following components:       Result Value   Neutro Abs 7.9 (*)    All other components within normal limits  COMPREHENSIVE METABOLIC PANEL - Abnormal; Notable for the following components:   Glucose, Bld 135 (*)    BUN 26 (*)    Creatinine, Ser 1.22 (*)    AST 177 (*)    ALT 98 (*)    Total Bilirubin 1.7 (*)  GFR calc non Af Amer 39 (*)    GFR calc Af Amer 45 (*)    All other components within normal limits  URINALYSIS, ROUTINE W REFLEX MICROSCOPIC - Abnormal; Notable for the following components:   Protein, ur 30 (*)    Bacteria, UA RARE (*)    All other components within normal limits  SARS CORONAVIRUS 2 (HOSPITAL ORDER, [facility])  TROPONIN I  LIPASE, BLOOD    EKG EKG Interpretation  Date/Time:  Saturday Jul 12 2018 16:59:29 EDT Ventricular Rate:  49 PR Interval:    QRS Duration: 152 QT Interval:  497 QTC Calculation: 449 R Axis:   -60 Text Interpretation:  Sinus or ectopic atrial bradycardia RBBB and LAFB Probable left ventricular hypertrophy EKg improved from previous  Confirmed by Wandra Arthurs [phone]) on 07/12/2018 5:08:36 PM   Radiology US Abdomen Complete  Result Date: 07/12/2018 CLINICAL DATA:  Abdominal pain since 2 p.m. today. EXAM: ABDOMEN ULTRASOUND COMPLETE COMPARISON:  None. FINDINGS: Gallbladder: Cholelithiasis. No gallbladder wall thickening. No pericholecystic fluid. No sonographic Murphy sign. Common bile duct: Diameter: 10.8 mm.  No choledocholithiasis. Liver: No focal hepatic lesion. Mild coarse hepatic echotexture with normal contour. Portal vein is patent on color Doppler imaging with normal direction of blood flow towards the liver. IVC: No abnormality visualized. Pancreas: Limited visualization secondary to overlying bowel gas. Spleen: Size and appearance within normal limits. Right Kidney: Length: 8.9 cm. Renal cortical thinning. Echogenicity within normal limits. No mass or hydronephrosis visualized. Left Kidney: Length: 7.8 cm. Renal cortical thinning.  Echogenicity within normal limits. No mass or hydronephrosis visualized. Abdominal aorta: No aneurysm visualized. Other findings: None. IMPRESSION: 1. Cholelithiasis without sonographic evidence of acute cholecystitis. 2. Bilateral renal atrophy. Electronically Signed   By: Kathreen Devoid   On: 07/12/2018 18:13   Dg Chest Port 1 View  Result Date: 07/12/2018 CLINICAL DATA:  Per [facility] pt coming from home ([facility]) after having a sudden onset of mid epigastric pain radiating into back after gardening. Patient given 324 aspirin, 8mg  zofran and 2 nitro with no relief. Patient alert and orientated x 4. EXAM: PORTABLE CHEST - 1 VIEW COMPARISON:  06/02/2015 FINDINGS: Lungs are clear. Heart size upper limits normal. Aortic Atherosclerosis (ICD10-170.0). No effusion. Visualized bones unremarkable. IMPRESSION: No acute cardiopulmonary disease. Electronically Signed   By: Lucrezia Europe M.D.   On: 07/12/2018 17:18   Ct Angio Chest/abd/pel For Dissection W And/or Wo Contrast  Result Date: 07/12/2018 CLINICAL DATA:  Sudden onset of epigastric pain radiating into back after gardening. EXAM: CT ANGIOGRAPHY CHEST, ABDOMEN AND PELVIS TECHNIQUE: Multidetector CT imaging through the chest, abdomen and pelvis was performed using the standard protocol during bolus administration of intravenous contrast. Multiplanar reconstructed images and MIPs were obtained and reviewed to evaluate the vascular anatomy. CONTRAST:  19mL OMNIPAQUE IOHEXOL 350 MG/ML SOLN COMPARISON:  Chest x-ray Jul 12, 2018 FINDINGS: CTA CHEST FINDINGS Cardiovascular: Mild cardiomegaly. Coronary artery calcifications involve the LAD and circumflex coronary arteries. Visualized pulmonary arteries are normal in appearance with no filling defects or pulmonary emboli noted. Mild scattered atherosclerotic changes are seen in the thoracic aorta. No aneurysm or dissection in the thoracic aorta. Mediastinum/Nodes: No enlarged mediastinal, hilar, or axillary  lymph nodes. Thyroid gland, trachea, and esophagus demonstrate no significant findings. Lungs/Pleura: Mild scarring in the medial apices bilaterally. No pneumothorax. Central airways are normal. There is a nodule in the lateral left lung base measuring 7 mm in mean diameter. No other  nodules. No masses or infiltrates. Musculoskeletal: See below. Review of the MIP images confirms the above findings. CTA ABDOMEN AND PELVIS FINDINGS VASCULAR Aorta: The abdominal aorta is tortuous without aneurysm or dissection. Celiac: Mild atherosclerotic changes are seen near the origin of the celiac artery with no narrowing. SMA: Patent without evidence of aneurysm, dissection, vasculitis or significant stenosis. Renals: Both renal arteries are patent without evidence of aneurysm, dissection, vasculitis, fibromuscular dysplasia or significant stenosis. IMA: Patent without evidence of aneurysm, dissection, vasculitis or significant stenosis. Inflow: Patent without evidence of aneurysm, dissection, vasculitis or significant stenosis. Veins: No obvious venous abnormality within the limitations of this arterial phase study. The iliac and proximal femoral arteries are normal in appearance with mild scattered atherosclerotic changes. Review of the MIP images confirms the above findings. NON-VASCULAR Hepatobiliary: Evaluation is limited due to arterial phase imaging. No liver masses are identified. The portal vein is not well assessed due to timing of contrast. The gallbladder is distended. A rounded region of high attenuation in the gallbladder is likely a stone or tumefactive sludge measuring up to 3.3 cm. No wall thickening or pericholecystic fluid noted. Pancreas: There is fatty infiltration of the pancreatic head of no acute significance. No suspicious masses identified or evidence of pancreatitis. Spleen: Normal in size without focal abnormality. Adrenals/Urinary Tract: Adrenal glands are normal. Bilateral renal cysts are identified.  No hydronephrosis or acute perinephric stranding. No ureteral stones. The bladder is normal. Stomach/Bowel: The stomach is normal. A duodenal diverticulum is identified off the second portion of the duodenum without evidence of inflammation, of no acute significance. The small bowel is otherwise normal. Colonic diverticulosis is seen without diverticulitis. The appendix is normal. Lymphatic: No significant vascular findings are present. No enlarged abdominal or pelvic lymph nodes. Reproductive: Status post hysterectomy. No adnexal masses. Other: No abdominal wall hernia or abnormality. No abdominopelvic ascites. Musculoskeletal: L4 pars defects are noted with grade 2 anterolisthesis of L4 versus L5. No other malalignment. Severe degenerative changes in the lumbar spine with more mild degenerative changes in the thoracic spine. Lower lumbar facet degenerative changes identified. Other bones are unremarkable. Review of the MIP images confirms the above findings. IMPRESSION: 1. No aortic aneurysm or dissection. 2. The gallbladder is somewhat distended. A rounded region of high attenuation in the gallbladder measuring 3.3 cm is likely a stone or tumefactive sludge. No wall thickening or pericholecystic fluid. If there is clinical concern for acute cholecystitis, recommend ultrasound. 3. Significant degenerative changes in the lumbar spine. Scattered degenerative changes seen in the thoracic spine as well. 4. 7 mm nodule in the left lower lobe. Non-contrast chest CT at 6-12 months is recommended. If the nodule is stable at time of repeat CT, then future CT at 18-24 months (from today's scan) is considered optional for low-risk patients, but is recommended for high-risk patients. This recommendation follows the consensus statement: Guidelines for Management of Incidental Pulmonary Nodules Detected on CT Images: From the Fleischner Society 2017; Radiology 2017; 284:228-243. 5. Coronary artery calcifications in the LAD and  circumflex arteries. 6. Colonic diverticulosis without diverticulitis. Electronically Signed   By: Dorise Bullion III M.D   On: 07/12/2018 19:59    Procedures Procedures (including critical care time)  Medications Ordered in ED Medications  morphine 4 MG/ML injection 4 mg (4 mg Intravenous Not Given 07/12/18 2011)  piperacillin-tazobactam (ZOSYN) IVPB 3.375 g (has no administration in time range)  metoCLOPramide (REGLAN) injection 10 mg (10 mg Intravenous Given 07/12/18 1713)  diphenhydrAMINE (BENADRYL) injection 12.5  mg (12.5 mg Intravenous Given 07/12/18 1712)  morphine 4 MG/ML injection 4 mg (4 mg Intravenous Given 07/12/18 1842)  iohexol (OMNIPAQUE) 350 MG/ML injection 80 mL (80 mLs Intravenous Contrast Given 07/12/18 1939)  metoCLOPramide (REGLAN) injection 10 mg (10 mg Intravenous Given 07/12/18 2012)     Initial Impression / Assessment and Plan / ED Course  I have reviewed the triage vital signs and the nursing notes.  Pertinent labs & imaging results that were available during my care of the patient were reviewed by me and considered in my medical decision making (see chart for details).       Xareni Kelch is a 85 y.o. female here with RUQ pain, epigastric pain. Likely biliary vs renal colic. Less likely ACS or symptomatic bradycardia or dissection. Will get labs, trop, RUQ [place].   8:26 PM LFTs elevated. US showed gallstones but no obvious cholecytitis. Still in pain so CTA performed and showed no dissection, but confirmed gallstones. Talked to Dr. Gershon Crane from surgery. He states that patient is 85 years old so recommend IV abx and medicine admission. Will keep NPO for now and surgery will see patient as consult for biliary colic.   Final Clinical Impressions(s) / ED Diagnoses   Final diagnoses:  None    ED Discharge Orders    None       Drenda Freeze, MD 07/12/18 2029

## 2018-07-12 NOTE — Progress Notes (Signed)
Pharmacy Antibiotic Note  April Hodges is a 85 y.o. female admitted on 07/12/2018 with abdominal pain.  Pharmacy has been consulted for zosyn dosing. Cr 1.2 WBC wnl afebrile - CT + gall stones   Plan: Zosyn 3.375gm iv x1 in ED then Zosyn 3.375g IV q8h (4 hour infusion).  Height: 4\' 11"  (149.9 cm) Weight: 157 lb (71.2 kg) IBW/kg (Calculated) : 43.2  Temp (24hrs), Avg:97.8 F (36.6 C), Min:97.8 F (36.6 C), Max:97.8 F (36.6 C)  Recent Labs  Lab 07/12/18 1703  WBC 9.7  CREATININE 1.22*    Estimated Creatinine Clearance: 26.3 mL/min (A) (by C-G formula based on SCr of 1.22 mg/dL (H)).    Allergies  Allergen Reactions  . Amlodipine Swelling    Coughing as well  . Bystolic [Nebivolol Hcl] Other (See Comments)    Bradycardia - HR in the 40s    Antimicrobials this admission:   Dose adjustments this admission:   Microbiology results:   Bonnita Nasuti Pharm.D. CPP, BCPS Clinical Pharmacist [phone] 07/12/2018 9:32 PM

## 2018-07-13 ENCOUNTER — Inpatient Hospital Stay (HOSPITAL_COMMUNITY): Payer: PPO

## 2018-07-13 LAB — BLOOD CULTURE ID PANEL (REFLEXED)

## 2018-07-13 LAB — COMPREHENSIVE METABOLIC PANEL
ALT: 350 U/L — ABNORMAL HIGH (ref 0–44)
AST: 301 U/L — ABNORMAL HIGH (ref 15–41)
Albumin: 3 g/dL — ABNORMAL LOW (ref 3.5–5.0)
Alkaline Phosphatase: 122 U/L (ref 38–126)
Anion gap: 10 (ref 5–15)
BUN: 26 mg/dL — ABNORMAL HIGH (ref 8–23)
CO2: 24 mmol/L (ref 22–32)
Calcium: 9 mg/dL (ref 8.9–10.3)
Chloride: 105 mmol/L (ref 98–111)
Creatinine, Ser: 1.58 mg/dL — ABNORMAL HIGH (ref 0.44–1.00)
GFR calc Af Amer: 33 mL/min — ABNORMAL LOW (ref >60)
GFR calc non Af Amer: 29 mL/min — ABNORMAL LOW (ref >60)
Glucose, Bld: 89 mg/dL (ref 70–99)
Potassium: 4.4 mmol/L (ref 3.5–5.1)
Sodium: 139 mmol/L (ref 135–145)
Total Bilirubin: 5.8 mg/dL — ABNORMAL HIGH (ref 0.3–1.2)
Total Protein: 6.6 g/dL (ref 6.5–8.1)

## 2018-07-13 LAB — BASIC METABOLIC PANEL
Anion gap: 14 (ref 5–15)
BUN: 24 mg/dL — ABNORMAL HIGH (ref 8–23)
CO2: 22 mmol/L (ref 22–32)
Calcium: 9 mg/dL (ref 8.9–10.3)
Chloride: 100 mmol/L (ref 98–111)
Creatinine, Ser: 1.46 mg/dL — ABNORMAL HIGH (ref 0.44–1.00)
GFR calc Af Amer: 36 mL/min — ABNORMAL LOW (ref >60)
GFR calc non Af Amer: 31 mL/min — ABNORMAL LOW (ref >60)
Glucose, Bld: 136 mg/dL — ABNORMAL HIGH (ref 70–99)
Potassium: 5 mmol/L (ref 3.5–5.1)
Sodium: 136 mmol/L (ref 135–145)

## 2018-07-13 LAB — CBC
HCT: 38.8 % (ref 36.0–46.0)
Hemoglobin: 12.7 g/dL (ref 12.0–15.0)
MCH: 30.2 pg (ref 26.0–34.0)
MCHC: 32.7 g/dL (ref 30.0–36.0)
MCV: 92.2 fL (ref 80.0–100.0)
Platelets: 229 10*3/uL (ref 150–400)
RBC: 4.21 MIL/uL (ref 3.87–5.11)
RDW: 13.1 % (ref 11.5–15.5)
WBC: 16.3 10*3/uL — ABNORMAL HIGH (ref 4.0–10.5)
nRBC: 0 % (ref 0.0–0.2)

## 2018-07-13 LAB — MRSA PCR SCREENING: MRSA by PCR: NEGATIVE

## 2018-07-13 LAB — ABO/RH: ABO/RH(D): B POS

## 2018-07-13 MED ORDER — ENOXAPARIN SODIUM 30 MG/0.3ML ~~LOC~~ SOLN
30.0000 mg | SUBCUTANEOUS | Status: DC
  Administered 2018-07-13 – 2018-07-17 (×3): 30 mg via SUBCUTANEOUS
  Filled 2018-07-13 (×3): qty 0.3

## 2018-07-13 MED ORDER — SODIUM CHLORIDE 0.9 % IV SOLN
INTRAVENOUS | Status: AC
  Administered 2018-07-13: 15:00:00 via INTRAVENOUS

## 2018-07-13 MED ORDER — TECHNETIUM TC 99M MEBROFENIN IV KIT
5.4600 | PACK | Freq: Once | INTRAVENOUS | Status: AC | PRN
  Administered 2018-07-13: 5.46 via INTRAVENOUS

## 2018-07-13 NOTE — Progress Notes (Signed)
PHARMACY - PHYSICIAN COMMUNICATION CRITICAL VALUE ALERT - BLOOD CULTURE IDENTIFICATION (BCID)  April Hodges is an 85 y.o. female who presented to [facility] on 07/12/2018   Assessment:  Ecoli in blood cx  Name of physician (or Provider) Contacted: Dr Broadus John  Current antibiotics: Zosyn  Changes to prescribed antibiotics recommended:  Continue  Results for orders placed or performed during the hospital encounter of 07/12/18  Blood Culture ID Panel (Reflexed) (Collected: 07/12/2018 10:07 PM)  Result Value Ref Range   Enterococcus species NOT DETECTED NOT DETECTED   Listeria monocytogenes NOT DETECTED NOT DETECTED   Staphylococcus species NOT DETECTED NOT DETECTED   Staphylococcus aureus (BCID) NOT DETECTED NOT DETECTED   Streptococcus species NOT DETECTED NOT DETECTED   Streptococcus agalactiae NOT DETECTED NOT DETECTED   Streptococcus pneumoniae NOT DETECTED NOT DETECTED   Streptococcus pyogenes NOT DETECTED NOT DETECTED   Acinetobacter baumannii NOT DETECTED NOT DETECTED   Enterobacteriaceae species DETECTED (A) NOT DETECTED   Enterobacter cloacae complex NOT DETECTED NOT DETECTED   Escherichia coli DETECTED (A) NOT DETECTED   Klebsiella oxytoca NOT DETECTED NOT DETECTED   Klebsiella pneumoniae NOT DETECTED NOT DETECTED   Proteus species NOT DETECTED NOT DETECTED   Serratia marcescens NOT DETECTED NOT DETECTED   Carbapenem resistance NOT DETECTED NOT DETECTED   Haemophilus influenzae NOT DETECTED NOT DETECTED   Neisseria meningitidis NOT DETECTED NOT DETECTED   Pseudomonas aeruginosa NOT DETECTED NOT DETECTED   Candida albicans NOT DETECTED NOT DETECTED   Candida glabrata NOT DETECTED NOT DETECTED   Candida krusei NOT DETECTED NOT DETECTED   Candida parapsilosis NOT DETECTED NOT DETECTED   Candida tropicalis NOT DETECTED NOT DETECTED   Levester Fresh, PharmD, BCPS, BCCCP Clinical Pharmacist [phone]  Please check AMION for all [place] numbers  07/13/2018 12:50 PM

## 2018-07-13 NOTE — Progress Notes (Signed)
Subjective No acute events. Abdominal pain much improved. Denies any currently.  Objective: Vital signs in last 24 hours: Temp:  [97.6 F (36.4 C)-100.1 F (37.8 C)] 98.9 F (37.2 C) (05/03 0947) Pulse Rate:  [45-96] 61 (05/03 0947) Resp:  [14-18] 18 (05/03 0947) BP: (121-190)/(53-136) 143/53 (05/03 0947) SpO2:  [90 %-100 %] 90 % (05/03 0947) Weight:  [71 kg-71.2 kg] 71 kg (05/03 0450)    Intake/Output from previous day: 05/02 0701 - 05/03 0700 In: 488.4 [P.O.:60; I.V.:428.4] Out: 280 [Urine:280] Intake/Output this shift: No intake/output data recorded.  Gen: NAD, comfortable CV: RRR Pulm: Normal work of breathing Abd: Soft, NT/ND; negative Murphy's sign Ext: SCDs in place  Lab Results: CBC  Recent Labs    07/12/18 1703 07/13/18 0350  WBC 9.7 16.3*  HGB 13.2 12.7  HCT 41.0 38.8  PLT 253 229   BMET Recent Labs    07/12/18 1703 07/13/18 0350  NA 137 136  K 4.9 5.0  CL 102 100  CO2 22 22  GLUCOSE 135* 136*  BUN 26* 24*  CREATININE 1.22* 1.46*  CALCIUM 9.6 9.0   PT/INR Recent Labs    07/12/18 2125  LABPROT 13.4  INR 1.0   ABG No results for input(s): PHART, HCO3 in the last 72 hours.  Invalid input(s): PCO2, PO2  Studies/Results:  Anti-infectives: Anti-infectives (From admission, onward)   Start     Dose/Rate Route Frequency Ordered Stop   07/13/18 0600  piperacillin-tazobactam (ZOSYN) IVPB 3.375 g     3.375 g 12.5 mL/hr over 240 Minutes Intravenous Every 8 hours 07/12/18 2127     07/12/18 2030  piperacillin-tazobactam (ZOSYN) IVPB 3.375 g     3.375 g 100 mL/hr over 30 Minutes Intravenous  Once 07/12/18 2017 07/12/18 2329       Assessment/Plan: Patient Active Problem List   Diagnosis Date Noted  . Biliary colic 01/65/5374  . Cholelithiasis 07/12/2018  . HLD (hyperlipidemia) 07/12/2018  . Chronic diastolic CHF (congestive heart failure) ([place]) 07/12/2018  . Lung nodule 07/12/2018  . Hypothyroidism   . Sinus bradycardia   . CKD  (chronic kidney disease), stage III ([place])   . Demand ischemia ([place]) 06/03/2015  . Acute on chronic renal failure ([place]) 06/03/2015  . Essential hypertension 06/03/2015  . UTI (urinary tract infection) 06/03/2015  . Back pain 06/10/2014   Ms. Easterwood is a pleasant 57yoF with hx of HTN, hypothyroidism, CKD III, sinus brady/CHF admitted with possible cholecystitis (vs symptomatic cholelithiasis)  -HIDA scan currently pending -Would continue to treat empirically with IV abx until this is completed -Cardiology evaluation for cardiac clearance -We will follow with you   LOS: 1 day   Sharon Mt. Dema Severin, M.D. [facility]

## 2018-07-13 NOTE — Progress Notes (Addendum)
PROGRESS NOTE    Halle Davlin  ZDG:[id number] DOB: 01-11-1926 DOA: 07/12/2018 PCP: Lajean Manes, MD  Brief Narrative: Janeli Lewison is a 85 y.o. female with medical history significant of hypertension, hyperlipidemia, CKD stage III, GERD, hypothyroidism, sinus bradycardia, presented with abdominal pain, nausea vomiting. -CT noted distended gallbladder, gallstones   Assessment & Plan:  Cholelithiasis with suspected cholecystitis  -CT and ultrasound noted cholelithiasis, No bile duct dilation  -Blood cultures growing gram-negative rods consistent with biliary source  -Continue IV Zosyn  -General surgery consulted and following  -HIDA scan to be done today  -Continue IV fluids -addendum: 4pm with HIDA concerning for high grade CBD obstruction-GI consulted, d/w Dr.Nandigam  Essential hypertension: -IV Hydralazine prn -Continue home medications: Lisinopril -Continue Cardizem (with holding parameters for HR<60)  Hypothyroidism; -Continue Synthroid  CKD (chronic kidney disease), stage III ([place]): stable.  Baseline creatinine 1.0-1.3.  Her creatinine is 1.22, BUN 26. -Follow-up by BMP  Chronic diastolic CHF (congestive heart failure) ([place]): 2D echo 11/28/2017 showed EF 60 to 65% with grade 1 diastolic dysfunction.  -No evidence of volume overload, not on diuretics at baseline  Lung nodule: Incidental findings on CTA She will follow-up with PCP   DVT prophylaxis: Lovenox Code Status: Full code Family Communication: No family at bedside, called and discussed with son Disposition Plan: Home pending above work-up  Consultants:   General surgery   Procedures:   Antimicrobials:    Subjective: -Feels better today, no further nausea vomiting, right-sided abdominal tenderness is better  Objective: Vitals:   07/13/18 0015 07/13/18 0323 07/13/18 0450 07/13/18 0947  BP:   121/60 (!) 143/53  Pulse:   67 61  Resp:   17 18  Temp: 100.1 F (37.8 C) 98.7 F (37.1 C) 98.2 F  (36.8 C) 98.9 F (37.2 C)  TempSrc: Oral Oral  Oral  SpO2:   93% 90%  Weight:   71 kg   Height:        Intake/Output Summary (Last 24 hours) at 07/13/2018 1223 Last data filed at 07/13/2018 0942 Gross per 24 hour  Intake 488.41 ml  Output 280 ml  Net 208.41 ml   Filed Weights   07/12/18 1700 07/13/18 0450  Weight: 71.2 kg 71 kg    Examination:  General exam: Appears calm and comfortable  Respiratory system: Clear to auscultation. Respiratory effort normal. Cardiovascular system: S1 & S2 heard, RRR Gastrointestinal system: Soft, nontender, nondistended, bowel sounds present Central nervous system: Alert and oriented. No focal neurological deficits. Extremities: No edema Skin: No rashes, lesions or ulcers Psychiatry: Judgement and insight appear normal. Mood & affect appropriate.     Data Reviewed:   CBC: Recent Labs  Lab 07/12/18 1703 07/13/18 0350  WBC 9.7 16.3*  NEUTROABS 7.9*  --   HGB 13.2 12.7  HCT 41.0 38.8  MCV 94.0 92.2  PLT 253 595   Basic Metabolic Panel: Recent Labs  Lab 07/12/18 1703 07/13/18 0350  NA 137 136  K 4.9 5.0  CL 102 100  CO2 22 22  GLUCOSE 135* 136*  BUN 26* 24*  CREATININE 1.22* 1.46*  CALCIUM 9.6 9.0   GFR: Estimated Creatinine Clearance: 22 mL/min (A) (by C-G formula based on SCr of 1.46 mg/dL (H)). Liver Function Tests: Recent Labs  Lab 07/12/18 1703  AST 177*  ALT 98*  ALKPHOS 109  BILITOT 1.7*  PROT 7.3  ALBUMIN 3.6   Recent Labs  Lab 07/12/18 1703  LIPASE 33   No results for input(s): AMMONIA in  the last 168 hours. Coagulation Profile: Recent Labs  Lab 07/12/18 2125  INR 1.0   Cardiac Enzymes: Recent Labs  Lab 07/12/18 1703  TROPONINI <0.03   BNP (last 3 results) No results for input(s): PROBNP in the last 8760 hours. HbA1C: No results for input(s): HGBA1C in the last 72 hours. CBG: No results for input(s): GLUCAP in the last 168 hours. Lipid Profile: No results for input(s): CHOL, HDL,  LDLCALC, TRIG, CHOLHDL, LDLDIRECT in the last 72 hours. Thyroid Function Tests: No results for input(s): TSH, T4TOTAL, FREET4, T3FREE, THYROIDAB in the last 72 hours. Anemia Panel: No results for input(s): VITAMINB12, FOLATE, FERRITIN, TIBC, IRON, RETICCTPCT in the last 72 hours. Urine analysis:    Component Value Date/Time   COLORURINE YELLOW 07/12/2018 [place] 07/12/2018 1711   LABSPEC 1.016 07/12/2018 1711   PHURINE 7.0 07/12/2018 1711   GLUCOSEU NEGATIVE 07/12/2018 1711   HGBUR NEGATIVE 07/12/2018 1711   BILIRUBINUR NEGATIVE 07/12/2018 1711   KETONESUR NEGATIVE 07/12/2018 1711   PROTEINUR 30 (A) 07/12/2018 1711   NITRITE NEGATIVE 07/12/2018 1711   LEUKOCYTESUR NEGATIVE 07/12/2018 1711   Sepsis Labs: @LABRCNTIP (procalcitonin:4,lacticidven:4)  ) Recent Results (from the past 240 hour(s))  SARS Coronavirus 2 (CEPHEID - Performed in [place] hospital lab), Hosp Order     Status: None   Collection Time: 07/12/18  8:17 PM  Result Value Ref Range Status   SARS Coronavirus 2 NEGATIVE NEGATIVE Final    Comment: (NOTE) If result is NEGATIVE SARS-CoV-2 target nucleic acids are NOT DETECTED. The SARS-CoV-2 RNA is generally detectable in upper and lower  respiratory specimens during the acute phase of infection. The lowest  concentration of SARS-CoV-2 viral copies this assay can detect is 250  copies / mL. A negative result does not preclude SARS-CoV-2 infection  and should not be used as the sole basis for treatment or other  patient management decisions.  A negative result may occur with  improper specimen collection / handling, submission of specimen other  than nasopharyngeal swab, presence of viral mutation(s) within the  areas targeted by this assay, and inadequate number of viral copies  (<250 copies / mL). A negative result must be combined with clinical  observations, patient history, and epidemiological information. If result is POSITIVE SARS-CoV-2  target nucleic acids are DETECTED. The SARS-CoV-2 RNA is generally detectable in upper and lower  respiratory specimens dur ing the acute phase of infection.  Positive  results are indicative of active infection with SARS-CoV-2.  Clinical  correlation with patient history and other diagnostic information is  necessary to determine patient infection status.  Positive results do  not rule out bacterial infection or co-infection with other viruses. If result is PRESUMPTIVE POSTIVE SARS-CoV-2 nucleic acids MAY BE PRESENT.   A presumptive positive result was obtained on the submitted specimen  and confirmed on repeat testing.  While 2019 novel coronavirus  (SARS-CoV-2) nucleic acids may be present in the submitted sample  additional confirmatory testing may be necessary for epidemiological  and / or clinical management purposes  to differentiate between  SARS-CoV-2 and other Sarbecovirus currently known to infect humans.  If clinically indicated additional testing with an alternate test  methodology [phone]) is advised. The SARS-CoV-2 RNA is generally  detectable in upper and lower respiratory sp ecimens during the acute  phase of infection. The expected result is Negative. Fact Sheet for Patients:  StrictlyIdeas.no Fact Sheet for Healthcare Providers: BankingDealers.co.za This test is not yet approved or cleared by  the [employer] and has been authorized for detection and/or diagnosis of SARS-CoV-2 by FDA under an Emergency Use Authorization (EUA).  This EUA will remain in effect (meaning this test can be used) for the duration of the COVID-19 declaration under Section 564(b)(1) of the Act, 21 U.S.C. section 360bbb-3(b)(1), unless the authorization is terminated or revoked sooner. Performed at [facility], [place] [address] [place], [place] [address]   Culture, blood (Routine X 2) w Reflex to ID Panel     Status: None  (Preliminary result)   Collection Time: 07/12/18  9:25 PM  Result Value Ref Range Status   Specimen Description BLOOD LEFT HAND  Final   Special Requests   Final    BOTTLES DRAWN AEROBIC AND ANAEROBIC Blood Culture results may not be optimal due to an inadequate volume of blood received in culture bottles   Culture  Setup Time ANAEROBIC BOTTLE ONLY GRAM NEGATIVE RODS   Final   Culture   Final    NO GROWTH < 12 HOURS Performed at [facility], [place] [address], [place], [place] [address]    Report Status PENDING  Incomplete  Culture, blood (Routine X 2) w Reflex to ID Panel     Status: None (Preliminary result)   Collection Time: 07/12/18 10:07 PM  Result Value Ref Range Status   Specimen Description BLOOD RIGHT ANTECUBITAL  Final   Special Requests   Final    BOTTLES DRAWN AEROBIC AND ANAEROBIC Blood Culture adequate volume   Culture  Setup Time   Final    IN BOTH AEROBIC AND ANAEROBIC BOTTLES GRAM NEGATIVE RODS Organism ID to follow    Culture   Final    NO GROWTH < 12 HOURS Performed at [facility], [place] [address] [place], [place] [address]    Report Status PENDING  Incomplete  MRSA PCR Screening     Status: None   Collection Time: 07/12/18 11:46 PM  Result Value Ref Range Status   MRSA by PCR NEGATIVE NEGATIVE Final    Comment:        The GeneXpert MRSA Assay (FDA approved for NASAL specimens only), is one component of a comprehensive MRSA colonization surveillance program. It is not intended to diagnose MRSA infection nor to guide or monitor treatment for MRSA infections. Performed at [facility], [place] [address], [place], [place] [address]          Radiology Studies: US Abdomen Complete  Result Date: 07/12/2018 CLINICAL DATA:  Abdominal pain since 2 p.m. today. EXAM: ABDOMEN ULTRASOUND COMPLETE COMPARISON:  None. FINDINGS: Gallbladder: Cholelithiasis. No gallbladder wall thickening. No pericholecystic fluid. No sonographic Murphy sign.  Common bile duct: Diameter: 10.8 mm.  No choledocholithiasis. Liver: No focal hepatic lesion. Mild coarse hepatic echotexture with normal contour. Portal vein is patent on color Doppler imaging with normal direction of blood flow towards the liver. IVC: No abnormality visualized. Pancreas: Limited visualization secondary to overlying bowel gas. Spleen: Size and appearance within normal limits. Right Kidney: Length: 8.9 cm. Renal cortical thinning. Echogenicity within normal limits. No mass or hydronephrosis visualized. Left Kidney: Length: 7.8 cm. Renal cortical thinning. Echogenicity within normal limits. No mass or hydronephrosis visualized. Abdominal aorta: No aneurysm visualized. Other findings: None. IMPRESSION: 1. Cholelithiasis without sonographic evidence of acute cholecystitis. 2. Bilateral renal atrophy. Electronically Signed   By: Kathreen Devoid   On: 07/12/2018 18:13   Dg Chest Port 1 View  Result Date: 07/12/2018 CLINICAL DATA:  Per [facility] pt coming from home ([facility]) after having a sudden onset of mid epigastric pain radiating into back after gardening. Patient given 324 aspirin, 8mg  zofran and 2 nitro with no relief. Patient alert and orientated x 4. EXAM: PORTABLE CHEST - 1 VIEW COMPARISON:  06/02/2015 FINDINGS: Lungs are clear. Heart size upper limits normal. Aortic Atherosclerosis (ICD10-170.0). No effusion. Visualized bones unremarkable. IMPRESSION: No acute cardiopulmonary disease. Electronically Signed   By: Lucrezia Europe M.D.   On: 07/12/2018 17:18   Ct Angio Chest/abd/pel For Dissection W And/or Wo Contrast  Result Date: 07/12/2018 CLINICAL DATA:  Sudden onset of epigastric pain radiating into back after gardening. EXAM: CT ANGIOGRAPHY CHEST, ABDOMEN AND PELVIS TECHNIQUE: Multidetector CT imaging through the chest, abdomen and pelvis was performed using the standard protocol during bolus administration of intravenous contrast. Multiplanar reconstructed images and MIPs  were obtained and reviewed to evaluate the vascular anatomy. CONTRAST:  45mL OMNIPAQUE IOHEXOL 350 MG/ML SOLN COMPARISON:  Chest x-ray Jul 12, 2018 FINDINGS: CTA CHEST FINDINGS Cardiovascular: Mild cardiomegaly. Coronary artery calcifications involve the LAD and circumflex coronary arteries. Visualized pulmonary arteries are normal in appearance with no filling defects or pulmonary emboli noted. Mild scattered atherosclerotic changes are seen in the thoracic aorta. No aneurysm or dissection in the thoracic aorta. Mediastinum/Nodes: No enlarged mediastinal, hilar, or axillary lymph nodes. Thyroid gland, trachea, and esophagus demonstrate no significant findings. Lungs/Pleura: Mild scarring in the medial apices bilaterally. No pneumothorax. Central airways are normal. There is a nodule in the lateral left lung base measuring 7 mm in mean diameter. No other nodules. No masses or infiltrates. Musculoskeletal: See below. Review of the MIP images confirms the above findings. CTA ABDOMEN AND PELVIS FINDINGS VASCULAR Aorta: The abdominal aorta is tortuous without aneurysm or dissection. Celiac: Mild atherosclerotic changes are seen near the origin of the celiac artery with no narrowing. SMA: Patent without evidence of aneurysm, dissection, vasculitis or significant stenosis. Renals: Both renal arteries are patent without evidence of aneurysm, dissection, vasculitis, fibromuscular dysplasia or significant stenosis. IMA: Patent without evidence of aneurysm, dissection, vasculitis or significant stenosis. Inflow: Patent without evidence of aneurysm, dissection, vasculitis or significant stenosis. Veins: No obvious venous abnormality within the limitations of this arterial phase study. The iliac and proximal femoral arteries are normal in appearance with mild scattered atherosclerotic changes. Review of the MIP images confirms the above findings. NON-VASCULAR Hepatobiliary: Evaluation is limited due to arterial phase imaging. No  liver masses are identified. The portal vein is not well assessed due to timing of contrast. The gallbladder is distended. A rounded region of high attenuation in the gallbladder is likely a stone or tumefactive sludge measuring up to 3.3 cm. No wall thickening or pericholecystic fluid noted. Pancreas: There is fatty infiltration of the pancreatic head of no acute significance. No suspicious masses identified or evidence of pancreatitis. Spleen: Normal in size without focal abnormality. Adrenals/Urinary Tract: Adrenal glands are normal. Bilateral renal cysts are identified. No hydronephrosis or acute perinephric stranding. No ureteral stones. The bladder is normal. Stomach/Bowel: The stomach is normal. A duodenal diverticulum is identified off the second portion of the duodenum without evidence of inflammation, of no acute significance. The small bowel is otherwise normal. Colonic diverticulosis is seen without diverticulitis. The appendix is normal. Lymphatic: No significant vascular findings are present. No enlarged abdominal or pelvic lymph nodes. Reproductive: Status post hysterectomy. No adnexal masses. Other: No abdominal wall hernia or abnormality. No abdominopelvic ascites. Musculoskeletal: L4 pars defects are noted with  grade 2 anterolisthesis of L4 versus L5. No other malalignment. Severe degenerative changes in the lumbar spine with more mild degenerative changes in the thoracic spine. Lower lumbar facet degenerative changes identified. Other bones are unremarkable. Review of the MIP images confirms the above findings. IMPRESSION: 1. No aortic aneurysm or dissection. 2. The gallbladder is somewhat distended. A rounded region of high attenuation in the gallbladder measuring 3.3 cm is likely a stone or tumefactive sludge. No wall thickening or pericholecystic fluid. If there is clinical concern for acute cholecystitis, recommend ultrasound. 3. Significant degenerative changes in the lumbar spine. Scattered  degenerative changes seen in the thoracic spine as well. 4. 7 mm nodule in the left lower lobe. Non-contrast chest CT at 6-12 months is recommended. If the nodule is stable at time of repeat CT, then future CT at 18-24 months (from today's scan) is considered optional for low-risk patients, but is recommended for high-risk patients. This recommendation follows the consensus statement: Guidelines for Management of Incidental Pulmonary Nodules Detected on CT Images: From the Fleischner Society 2017; Radiology 2017; 284:228-243. 5. Coronary artery calcifications in the LAD and circumflex arteries. 6. Colonic diverticulosis without diverticulitis. Electronically Signed   By: Dorise Bullion III M.D   On: 07/12/2018 19:59        Scheduled Meds: . diltiazem  120 mg Oral Daily  . levothyroxine  125 mcg Oral Q0600  . lisinopril  20 mg Oral Daily  . Multivitamin   Oral Daily  . pantoprazole  40 mg Oral Daily   Continuous Infusions: . sodium chloride    . piperacillin-tazobactam (ZOSYN)  IV 3.375 g (07/13/18 0618)     LOS: 1 day    Time spent: 42min    Domenic Polite, MD [facility]   07/13/2018, 12:23 PM

## 2018-07-14 ENCOUNTER — Encounter (HOSPITAL_COMMUNITY)
Admission: EM | Disposition: A | Discharge status: Home / Self Care | Payer: Self-pay | Source: Home / Self Care | Attending: Internal Medicine

## 2018-07-14 ENCOUNTER — Encounter (HOSPITAL_COMMUNITY): Payer: Self-pay

## 2018-07-14 ENCOUNTER — Inpatient Hospital Stay (HOSPITAL_COMMUNITY): Payer: PPO | Admitting: Certified Registered"

## 2018-07-14 ENCOUNTER — Inpatient Hospital Stay (HOSPITAL_COMMUNITY): Payer: PPO

## 2018-07-14 HISTORY — PX: REMOVAL OF STONES: SHX5545

## 2018-07-14 HISTORY — PX: BILIARY STENT PLACEMENT: SHX5538

## 2018-07-14 HISTORY — PX: SPHINCTEROTOMY: SHX5544

## 2018-07-14 HISTORY — PX: ERCP: SHX5425

## 2018-07-14 HISTORY — PX: SCLEROTHERAPY: SHX6841

## 2018-07-14 LAB — COMPREHENSIVE METABOLIC PANEL WITH GFR
ALT: 268 U/L — ABNORMAL HIGH (ref 0–44)
AST: 181 U/L — ABNORMAL HIGH (ref 15–41)
Albumin: 2.8 g/dL — ABNORMAL LOW (ref 3.5–5.0)
Alkaline Phosphatase: 113 U/L (ref 38–126)
Anion gap: 11 (ref 5–15)
BUN: 21 mg/dL (ref 8–23)
CO2: 22 mmol/L (ref 22–32)
Calcium: 8.8 mg/dL — ABNORMAL LOW (ref 8.9–10.3)
Chloride: 107 mmol/L (ref 98–111)
Creatinine, Ser: 1.48 mg/dL — ABNORMAL HIGH (ref 0.44–1.00)
GFR calc Af Amer: 36 mL/min — ABNORMAL LOW
GFR calc non Af Amer: 31 mL/min — ABNORMAL LOW
Glucose, Bld: 79 mg/dL (ref 70–99)
Potassium: 4.4 mmol/L (ref 3.5–5.1)
Sodium: 140 mmol/L (ref 135–145)
Total Bilirubin: 6.3 mg/dL — ABNORMAL HIGH (ref 0.3–1.2)
Total Protein: 6.4 g/dL — ABNORMAL LOW (ref 6.5–8.1)

## 2018-07-14 LAB — CBC
HCT: 35.9 % — ABNORMAL LOW (ref 36.0–46.0)
Hemoglobin: 11.8 g/dL — ABNORMAL LOW (ref 12.0–15.0)
MCH: 30.7 pg (ref 26.0–34.0)
MCHC: 32.9 g/dL (ref 30.0–36.0)
MCV: 93.5 fL (ref 80.0–100.0)
Platelets: 196 10*3/uL (ref 150–400)
RBC: 3.84 MIL/uL — ABNORMAL LOW (ref 3.87–5.11)
RDW: 13.2 % (ref 11.5–15.5)
WBC: 8.9 10*3/uL (ref 4.0–10.5)
nRBC: 0 % (ref 0.0–0.2)

## 2018-07-14 LAB — PREPARE RBC (CROSSMATCH)

## 2018-07-14 SURGERY — ERCP, WITH INTERVENTION IF INDICATED
Anesthesia: General

## 2018-07-14 MED ORDER — GLUCAGON HCL RDNA (DIAGNOSTIC) 1 MG IJ SOLR
INTRAMUSCULAR | Status: AC
  Filled 2018-07-14: qty 1

## 2018-07-14 MED ORDER — INDOMETHACIN 50 MG RE SUPP
RECTAL | Status: AC
  Filled 2018-07-14: qty 2

## 2018-07-14 MED ORDER — GLYCOPYRROLATE 0.2 MG/ML IJ SOLN
INTRAMUSCULAR | Status: AC
  Filled 2018-07-14: qty 2

## 2018-07-14 MED ORDER — LIDOCAINE 2% (20 MG/ML) 5 ML SYRINGE
INTRAMUSCULAR | Status: DC | PRN
  Administered 2018-07-14: 40 mg via INTRAVENOUS

## 2018-07-14 MED ORDER — SODIUM CHLORIDE 0.9 % IV SOLN: INTRAVENOUS | Status: DC

## 2018-07-14 MED ORDER — ADULT MULTIVITAMIN W/MINERALS CH
1.0000 | ORAL_TABLET | Freq: Every day | ORAL | Status: DC
  Administered 2018-07-15 – 2018-07-17 (×2): 1 via ORAL
  Filled 2018-07-14 (×2): qty 1

## 2018-07-14 MED ORDER — SUCCINYLCHOLINE CHLORIDE 200 MG/10ML IV SOSY
PREFILLED_SYRINGE | INTRAVENOUS | Status: DC | PRN
  Administered 2018-07-14: 100 mg via INTRAVENOUS

## 2018-07-14 MED ORDER — SODIUM CHLORIDE 0.9 % IV SOLN
INTRAVENOUS | Status: DC | PRN
  Administered 2018-07-14: 15:00:00 30 ug/min via INTRAVENOUS

## 2018-07-14 MED ORDER — LACTATED RINGERS IV SOLN
INTRAVENOUS | Status: DC
  Administered 2018-07-14: 13:00:00 via INTRAVENOUS

## 2018-07-14 MED ORDER — FENTANYL CITRATE (PF) 100 MCG/2ML IJ SOLN
INTRAMUSCULAR | Status: DC | PRN
  Administered 2018-07-14 (×2): 25 ug via INTRAVENOUS
  Administered 2018-07-14: 50 ug via INTRAVENOUS

## 2018-07-14 MED ORDER — PANTOPRAZOLE SODIUM 40 MG IV SOLR
40.0000 mg | Freq: Two times a day (BID) | INTRAVENOUS | Status: DC
  Administered 2018-07-14 – 2018-07-17 (×5): 40 mg via INTRAVENOUS
  Filled 2018-07-14 (×5): qty 40

## 2018-07-14 MED ORDER — INDOMETHACIN 50 MG RE SUPP
RECTAL | Status: DC | PRN
  Administered 2018-07-14: 100 mg via RECTAL

## 2018-07-14 MED ORDER — SODIUM CHLORIDE 0.9 % IV SOLN: 10.0000 mL/h | Freq: Once | INTRAVENOUS | Status: DC

## 2018-07-14 MED ORDER — FENTANYL CITRATE (PF) 100 MCG/2ML IJ SOLN
INTRAMUSCULAR | Status: AC
  Filled 2018-07-14: qty 2

## 2018-07-14 MED ORDER — PROPOFOL 10 MG/ML IV BOLUS
INTRAVENOUS | Status: DC | PRN
  Administered 2018-07-14: 20 mg via INTRAVENOUS
  Administered 2018-07-14: 90 mg via INTRAVENOUS
  Administered 2018-07-14: 10 mg via INTRAVENOUS

## 2018-07-14 MED ORDER — SODIUM CHLORIDE (PF) 0.9 % IJ SOLN
PREFILLED_SYRINGE | INTRAMUSCULAR | Status: DC | PRN
  Administered 2018-07-14: 15:00:00 3 mL

## 2018-07-14 MED ORDER — SODIUM CHLORIDE 0.9 % IV SOLN
INTRAVENOUS | Status: DC | PRN
  Administered 2018-07-14: 25 mL

## 2018-07-14 NOTE — Interval H&P Note (Signed)
History and Physical Interval Note: 89/female with gallstone, dilated CBD, abnormal LFTs, abnormal HIDA suspicious for CBD stones, for an ERCP.  07/14/2018 1:17 PM  April Hodges  has presented today for ERCP with the diagnosis of CBD stone.  The various methods of treatment have been discussed with the patient and family. After consideration of risks, benefits and other options for treatment, the patient has consented to  Procedure(s): ENDOSCOPIC RETROGRADE CHOLANGIOPANCREATOGRAPHY (ERCP) (N/A) as a surgical intervention.  The patient's history has been reviewed, patient examined, no change in status, stable for surgery.  I have reviewed the patient's chart and labs.  Questions were answered to the patient's satisfaction.     Ronnette Juniper

## 2018-07-14 NOTE — Progress Notes (Signed)
Subjective/Chief Complaint: NONE  Denies abdominal pain Denies nausea vomiting or itching  HIDA shows obstructed CBD     Objective: Vital signs in last 24 hours: Temp:  [97.8 F (36.6 C)-98.9 F (37.2 C)] 97.8 F (36.6 C) (05/04 0456) Pulse Rate:  [57-66] 66 (05/04 0456) Resp:  [16-19] 16 (05/04 0456) BP: (143-159)/(53-64) 159/59 (05/04 0456) SpO2:  [90 %-96 %] 92 % (05/04 0456) Weight:  [71.4 kg] 71.4 kg (05/03 2239) Last BM Date: 07/11/18  Intake/Output from previous day: 05/03 0701 - 05/04 0700 In: 120 [P.O.:120] Out: 1000 [Urine:1000] Intake/Output this shift: No intake/output data recorded.  General appearance: alert and cooperative Eyes: JAUNDICE  GI: soft, non-tender; bowel sounds normal; no masses,  no organomegaly  Lab Results:  Recent Labs    07/13/18 0350 07/14/18 0335  WBC 16.3* 8.9  HGB 12.7 11.8*  HCT 38.8 35.9*  PLT 229 196   BMET Recent Labs    07/13/18 1630 07/14/18 0335  NA 139 140  K 4.4 4.4  CL 105 107  CO2 24 22  GLUCOSE 89 79  BUN 26* 21  CREATININE 1.58* 1.48*  CALCIUM 9.0 8.8*   PT/INR Recent Labs    07/12/18 2125  LABPROT 13.4  INR 1.0   ABG No results for input(s): PHART, HCO3 in the last 72 hours.  Invalid input(s): PCO2, PO2  Studies/Results: Nm Hepatobiliary Liver Func  Result Date: 07/13/2018 CLINICAL DATA:  Abdominal pain.  Epigastric pain. EXAM: NUCLEAR MEDICINE HEPATOBILIARY IMAGING TECHNIQUE: Sequential images of the abdomen were obtained out to 60 minutes following intravenous administration of radiopharmaceutical. RADIOPHARMACEUTICALS:  5.46 mCi Tc-31m  Choletec IV COMPARISON:  Right upper quadrant ultrasound Jul 12, 2018 FINDINGS: There is prompt uptake of radiotracer in the liver. No excretion is seen from the liver during the 4 hours of imaging. Very little background activity seen outside the liver. IMPRESSION: 1. The prompt uptake of radiotracer in the liver with no washout by 4 hours could be seen in  the setting of high-grade common bile duct obstruction or severe underlying liver dysfunction. Given a dilated common bile duct, 10.8 mm, seen on the recent right upper quadrant ultrasound as well as the rapid uptake of radiotracer in the liver and the lack of background activity, a high-grade common bile duct obstruction is favored. Review of the CT scan from yesterday also demonstrates common bile duct dilatation in retrospect. An underlying cause for the dilatation is not identified on the CT scan. Recommend an MRCP or ERCP for better evaluation. These results will be called to the ordering clinician or representative by the Radiologist Assistant, and communication documented in the PACS or zVision Dashboard. Electronically Signed   By: Dorise Bullion III M.D   On: 07/13/2018 15:52   US Abdomen Complete  Result Date: 07/12/2018 CLINICAL DATA:  Abdominal pain since 2 p.m. today. EXAM: ABDOMEN ULTRASOUND COMPLETE COMPARISON:  None. FINDINGS: Gallbladder: Cholelithiasis. No gallbladder wall thickening. No pericholecystic fluid. No sonographic Murphy sign. Common bile duct: Diameter: 10.8 mm.  No choledocholithiasis. Liver: No focal hepatic lesion. Mild coarse hepatic echotexture with normal contour. Portal vein is patent on color Doppler imaging with normal direction of blood flow towards the liver. IVC: No abnormality visualized. Pancreas: Limited visualization secondary to overlying bowel gas. Spleen: Size and appearance within normal limits. Right Kidney: Length: 8.9 cm. Renal cortical thinning. Echogenicity within normal limits. No mass or hydronephrosis visualized. Left Kidney: Length: 7.8 cm. Renal cortical thinning. Echogenicity within normal limits. No mass or  hydronephrosis visualized. Abdominal aorta: No aneurysm visualized. Other findings: None. IMPRESSION: 1. Cholelithiasis without sonographic evidence of acute cholecystitis. 2. Bilateral renal atrophy. Electronically Signed   By: Kathreen Devoid   On:  07/12/2018 18:13   Dg Chest Port 1 View  Result Date: 07/12/2018 CLINICAL DATA:  Per [facility] pt coming from home ([facility]) after having a sudden onset of mid epigastric pain radiating into back after gardening. Patient given 324 aspirin, 8mg  zofran and 2 nitro with no relief. Patient alert and orientated x 4. EXAM: PORTABLE CHEST - 1 VIEW COMPARISON:  06/02/2015 FINDINGS: Lungs are clear. Heart size upper limits normal. Aortic Atherosclerosis (ICD10-170.0). No effusion. Visualized bones unremarkable. IMPRESSION: No acute cardiopulmonary disease. Electronically Signed   By: Lucrezia Europe M.D.   On: 07/12/2018 17:18   Ct Angio Chest/abd/pel For Dissection W And/or Wo Contrast  Result Date: 07/12/2018 CLINICAL DATA:  Sudden onset of epigastric pain radiating into back after gardening. EXAM: CT ANGIOGRAPHY CHEST, ABDOMEN AND PELVIS TECHNIQUE: Multidetector CT imaging through the chest, abdomen and pelvis was performed using the standard protocol during bolus administration of intravenous contrast. Multiplanar reconstructed images and MIPs were obtained and reviewed to evaluate the vascular anatomy. CONTRAST:  56mL OMNIPAQUE IOHEXOL 350 MG/ML SOLN COMPARISON:  Chest x-ray Jul 12, 2018 FINDINGS: CTA CHEST FINDINGS Cardiovascular: Mild cardiomegaly. Coronary artery calcifications involve the LAD and circumflex coronary arteries. Visualized pulmonary arteries are normal in appearance with no filling defects or pulmonary emboli noted. Mild scattered atherosclerotic changes are seen in the thoracic aorta. No aneurysm or dissection in the thoracic aorta. Mediastinum/Nodes: No enlarged mediastinal, hilar, or axillary lymph nodes. Thyroid gland, trachea, and esophagus demonstrate no significant findings. Lungs/Pleura: Mild scarring in the medial apices bilaterally. No pneumothorax. Central airways are normal. There is a nodule in the lateral left lung base measuring 7 mm in mean diameter. No other nodules.  No masses or infiltrates. Musculoskeletal: See below. Review of the MIP images confirms the above findings. CTA ABDOMEN AND PELVIS FINDINGS VASCULAR Aorta: The abdominal aorta is tortuous without aneurysm or dissection. Celiac: Mild atherosclerotic changes are seen near the origin of the celiac artery with no narrowing. SMA: Patent without evidence of aneurysm, dissection, vasculitis or significant stenosis. Renals: Both renal arteries are patent without evidence of aneurysm, dissection, vasculitis, fibromuscular dysplasia or significant stenosis. IMA: Patent without evidence of aneurysm, dissection, vasculitis or significant stenosis. Inflow: Patent without evidence of aneurysm, dissection, vasculitis or significant stenosis. Veins: No obvious venous abnormality within the limitations of this arterial phase study. The iliac and proximal femoral arteries are normal in appearance with mild scattered atherosclerotic changes. Review of the MIP images confirms the above findings. NON-VASCULAR Hepatobiliary: Evaluation is limited due to arterial phase imaging. No liver masses are identified. The portal vein is not well assessed due to timing of contrast. The gallbladder is distended. A rounded region of high attenuation in the gallbladder is likely a stone or tumefactive sludge measuring up to 3.3 cm. No wall thickening or pericholecystic fluid noted. Pancreas: There is fatty infiltration of the pancreatic head of no acute significance. No suspicious masses identified or evidence of pancreatitis. Spleen: Normal in size without focal abnormality. Adrenals/Urinary Tract: Adrenal glands are normal. Bilateral renal cysts are identified. No hydronephrosis or acute perinephric stranding. No ureteral stones. The bladder is normal. Stomach/Bowel: The stomach is normal. A duodenal diverticulum is identified off the second portion of the duodenum without evidence of inflammation, of no acute significance. The small bowel is  otherwise normal. Colonic diverticulosis is seen without diverticulitis. The appendix is normal. Lymphatic: No significant vascular findings are present. No enlarged abdominal or pelvic lymph nodes. Reproductive: Status post hysterectomy. No adnexal masses. Other: No abdominal wall hernia or abnormality. No abdominopelvic ascites. Musculoskeletal: L4 pars defects are noted with grade 2 anterolisthesis of L4 versus L5. No other malalignment. Severe degenerative changes in the lumbar spine with more mild degenerative changes in the thoracic spine. Lower lumbar facet degenerative changes identified. Other bones are unremarkable. Review of the MIP images confirms the above findings. IMPRESSION: 1. No aortic aneurysm or dissection. 2. The gallbladder is somewhat distended. A rounded region of high attenuation in the gallbladder measuring 3.3 cm is likely a stone or tumefactive sludge. No wall thickening or pericholecystic fluid. If there is clinical concern for acute cholecystitis, recommend ultrasound. 3. Significant degenerative changes in the lumbar spine. Scattered degenerative changes seen in the thoracic spine as well. 4. 7 mm nodule in the left lower lobe. Non-contrast chest CT at 6-12 months is recommended. If the nodule is stable at time of repeat CT, then future CT at 18-24 months (from today's scan) is considered optional for low-risk patients, but is recommended for high-risk patients. This recommendation follows the consensus statement: Guidelines for Management of Incidental Pulmonary Nodules Detected on CT Images: From the Fleischner Society 2017; Radiology 2017; 284:228-243. 5. Coronary artery calcifications in the LAD and circumflex arteries. 6. Colonic diverticulosis without diverticulitis. Electronically Signed   By: Dorise Bullion III M.D   On: 07/12/2018 19:59    Anti-infectives: Anti-infectives (From admission, onward)   Start     Dose/Rate Route Frequency Ordered Stop   07/13/18 0600   piperacillin-tazobactam (ZOSYN) IVPB 3.375 g     3.375 g 12.5 mL/hr over 240 Minutes Intravenous Every 8 hours 07/12/18 2127     07/12/18 2030  piperacillin-tazobactam (ZOSYN) IVPB 3.375 g     3.375 g 100 mL/hr over 30 Minutes Intravenous  Once 07/12/18 2017 07/12/18 2329      Assessment/Plan: Patient Active Problem List   Diagnosis Date Noted  . Biliary colic 84/66/5993  . Cholelithiasis 07/12/2018  . HLD (hyperlipidemia) 07/12/2018  . Chronic diastolic CHF (congestive heart failure) ([place]) 07/12/2018  . Lung nodule 07/12/2018  . Hypothyroidism   . Sinus bradycardia   . CKD (chronic kidney disease), stage III ([place])   . Demand ischemia ([place]) 06/03/2015  . Acute on chronic renal failure ([place]) 06/03/2015  . Essential hypertension 06/03/2015  . UTI (urinary tract infection) 06/03/2015  . Back pain 06/10/2014   Concern for CBD stone  Needs MRCP and GI consultation  follow LFT    LOS: 2 days    Joyice Faster Fredda Clarida 07/14/2018

## 2018-07-14 NOTE — Transfer of Care (Signed)
Immediate Anesthesia Transfer of Care Note  Patient: April Hodges  Procedure(s) Performed: ENDOSCOPIC RETROGRADE CHOLANGIOPANCREATOGRAPHY (ERCP) (N/A ) SPHINCTEROTOMY REMOVAL OF STONES SCLEROTHERAPY BILIARY STENT PLACEMENT  Patient Location: PACU  Anesthesia Type:General  Level of Consciousness: awake, alert , oriented and patient cooperative  Airway & Oxygen Therapy: Patient Spontanous Breathing and Patient connected to face mask oxygen  Post-op Assessment: Report given to RN and Post -op Vital signs reviewed and stable  Post vital signs: Reviewed and stable  Last Vitals:  Vitals Value Taken Time  BP 159/49 07/14/2018  3:41 PM  Temp 37.1 C 07/14/2018  3:41 PM  Pulse 84 07/14/2018  3:41 PM  Resp 20 07/14/2018  3:41 PM  SpO2 98 % 07/14/2018  3:41 PM    Last Pain:  Vitals:   07/14/18 1541  TempSrc: Oral  PainSc: 0-No pain         Complications: No apparent anesthesia complications

## 2018-07-14 NOTE — H&P (View-Only) (Signed)
[facility] Consult  Referring Provider: Reatha Harps, MD Primary Care Physician:  Lajean Manes, MD Primary Gastroenterologist: Sadie Haber Primary  Reason for Consultation:  CBD stone  HPI: April Hodges is a 85 y.o. female who lives at an independent living facility presented to the ER on 07/12/2018 with sudden onset of upper abdominal pain that radiated to her right upper quadrant and then to her back, described as intense, associated with few episodes of bilious vomiting.  Patient denies fever, chills.  Patient states she had similar symptoms a few months ago and initially thought that she was having a cardiac event.  After arrival of EMS she was given aspirin 324 mg and 2 doses of nitroglycerin without any relief.  She was found to have abnormal liver enzymes, normal troponin I, ultrasound showed cholelithiasis without evidence of acute cholecystitis, CBD of 10.8 mm, no choledocholithiasis.  Surgical consultation was obtained and recommended a HIDA scan which was performed yesterday and showed high-grade common bile duct obstruction or severe underlying liver dysfunction, given dilated CBD of 10.8 mm bile duct obstruction was favored and CAT scan from yesterday also showed common bile duct dilatation. CT of the chest abdomen and pelvis did not show aortic dissection, gallbladder appeared distended with 3.3 cm tumefactive sludge or stone. Meanwhile patient liver enzymes have trended up, T bili 6.3, AST 181, ALT 268,  ALP 113(was 5.8/301/350/122 yesterday), WBC count is trended down from 16.3-8.9. Patient denies unintentional weight loss, difficulty swallowing, pain on swallowing, acid reflux or heartburn.  No Prior EGD. Reports having colonoscopy in her 62s. Denies change in bowel habits, blood in stool or black stools. She is known to have sinus bradycardia but does not have a pacemaker or defibrillator.  Past Medical History:  Diagnosis Date  . Acute on chronic renal failure ([place])  06/03/2015  . Arthritis   . Cancer ([place])    skin cancer  . CKD (chronic kidney disease), stage III ([place])   . Demand ischemia ([place])    a. 05/2015 in setting of sepsis w/ troponin peak of 8.47;  b. 05/2015 Echo: EF 60-65%, no rwma, Gr1 DD-->No ischemic eval undertaken.  . Essential hypertension 06/03/2015  . GERD (gastroesophageal reflux disease)   . Headache    in younger years  . Hypothyroidism   . Pneumonia    as a child  . PONV (postoperative nausea and vomiting)   . Sinus bradycardia    a. states rates in 40's and low 50's are normal for her-->No AVN blocking agents.  . Systolic murmur    a. 04/9526 No sigificant valvular dzs on echo.  . Urosepsis 06/03/2015    Past Surgical History:  Procedure Laterality Date  . BILATERAL CARPAL TUNNEL RELEASE    . COLONOSCOPY    . EYE SURGERY Bilateral    cataracts removed and lens implant  . JOINT REPLACEMENT Bilateral    knees  . LUMBAR LAMINECTOMY/DECOMPRESSION MICRODISCECTOMY N/A 06/10/2014   Procedure: LUMBAR DECOMPRESSION L3 - L5 2 LEVELS;  Surgeon: Melina Schools, MD;  Location: [facility];  Service: Orthopedics;  Laterality: N/A;  . TONSILLECTOMY    . VAGINAL HYSTERECTOMY      Prior to Admission medications   Medication Sig Start Date End Date Taking? Authorizing Provider  acetaminophen (TYLENOL) 500 MG tablet Take 500 mg by mouth See admin instructions. 500mg  by mouth every morning and daily as needed for pain   Yes [provider]  diltiazem (CARDIZEM CD) 120 MG 24 hr capsule Take 120 mg by  mouth daily. 06/25/18  Yes [provider]  levothyroxine (SYNTHROID, LEVOTHROID) 125 MCG tablet Take 1 tablet (125 mcg total) by mouth daily. 06/05/15  Yes Bhagat, Bhavinkumar, PA  lisinopril (PRINIVIL,ZESTRIL) 20 MG tablet Take 1 tablet (20 mg total) by mouth daily. NEED OV. 02/25/18  Yes Skeet Latch, MD  Multiple Vitamins-Minerals (MULTIVITAMIN PO) Take 1 tablet by mouth daily.   Yes [provider]  omeprazole  (PRILOSEC) 10 MG capsule Take 10 mg by mouth daily. 06/25/18  Yes [provider]  furosemide (LASIX) 20 MG tablet TAKE 1 TABLET BY MOUTH EVERY DAY Patient not taking: Reported on 07/12/2018 01/23/18   Skeet Latch, MD  nitroGLYCERIN (NITROSTAT) 0.4 MG SL tablet Place 1 tablet (0.4 mg total) under the tongue every 5 (five) minutes x 3 doses as needed for chest pain. Patient not taking: Reported on 07/12/2018 06/05/15   Leanor Kail, PA    Current Facility-Administered Medications  Medication Dose Route Frequency Provider Last Rate Last Dose  . acetaminophen (TYLENOL) tablet 650 mg  650 mg Oral Q6H PRN Ivor Costa, MD       Or  . acetaminophen (TYLENOL) suppository 650 mg  650 mg Rectal Q6H PRN Ivor Costa, MD      . diltiazem (CARDIZEM CD) 24 hr capsule 120 mg  120 mg Oral Daily Ivor Costa, MD   120 mg at 07/14/18 1015  . enoxaparin (LOVENOX) injection 30 mg  30 mg Subcutaneous Q24H Domenic Polite, MD   30 mg at 07/13/18 1636  . hydrALAZINE (APRESOLINE) injection 5 mg  5 mg Intravenous Q2H PRN Ivor Costa, MD      . levothyroxine (SYNTHROID) tablet 125 mcg  125 mcg Oral Q0600 Ivor Costa, MD   125 mcg at 07/14/18 0526  . lisinopril (ZESTRIL) tablet 20 mg  20 mg Oral Daily Ivor Costa, MD   20 mg at 07/14/18 1015  . morphine 2 MG/ML injection 0.5 mg  0.5 mg Intravenous Q4H PRN Ivor Costa, MD      . Derrill Memo ON 07/15/2018] multivitamin with minerals tablet 1 tablet  1 tablet Oral Daily Domenic Polite, MD      . ondansetron [facility]) injection 4 mg  4 mg Intravenous Q8H PRN Ivor Costa, MD      . oxyCODONE-acetaminophen (PERCOCET/ROXICET) 5-325 MG per tablet 1 tablet  1 tablet Oral Q4H PRN Ivor Costa, MD      . pantoprazole (PROTONIX) EC tablet 40 mg  40 mg Oral Daily Ivor Costa, MD   40 mg at 07/14/18 1015  . piperacillin-tazobactam (ZOSYN) IVPB 3.375 g  3.375 g Intravenous Cleophas Dunker, MD 12.5 mL/hr at 07/14/18 0538 3.375 g at 07/14/18 0538    Allergies as of 07/12/2018 - Review  Complete 07/12/2018  Allergen Reaction Noted  . Amlodipine Swelling 10/07/2015  . Bystolic [nebivolol hcl] Other (See Comments) 10/07/2015    Family History  Problem Relation Age of Onset  . Congestive Heart Failure Mother   . Heart attack Father     Social History   Socioeconomic History  . Marital status: Married    Spouse name: Not on file  . Number of children: Not on file  . Years of education: Not on file  . Highest education level: Not on file  Occupational History  . Not on file  Social Needs  . Financial resource strain: Not on file  . Food insecurity:    Worry: Not on file    Inability: Not on file  . Transportation needs:  Medical: Not on file    Non-medical: Not on file  Tobacco Use  . Smoking status: Never Smoker  . Smokeless tobacco: Never Used  Substance and Sexual Activity  . Alcohol use: Yes    Comment: occ wine  . Drug use: No  . Sexual activity: Not on file  Lifestyle  . Physical activity:    Days per week: Not on file    Minutes per session: Not on file  . Stress: Not on file  Relationships  . Social connections:    Talks on phone: Not on file    Gets together: Not on file    Attends religious service: Not on file    Active member of club or organization: Not on file    Attends meetings of clubs or organizations: Not on file    Relationship status: Not on file  . Intimate partner violence:    Fear of current or ex partner: Not on file    Emotionally abused: Not on file    Physically abused: Not on file    Forced sexual activity: Not on file  Other Topics Concern  . Not on file  Social History Narrative  . Not on file    Review of Systems: Positive for: GI: Described in detail in HPI.    Gen: Denies any fever, chills, rigors, night sweats, anorexia, fatigue, weakness, malaise, involuntary weight loss, and sleep disorder CV: Denies chest pain, angina, palpitations, syncope, orthopnea, PND, peripheral edema, and claudication. Resp:  Denies dyspnea, cough, sputum, wheezing, coughing up blood. GU : Denies urinary burning, blood in urine, urinary frequency, urinary hesitancy, nocturnal urination, and urinary incontinence. MS: Denies joint pain or swelling.  Denies muscle weakness, cramps, atrophy.  Derm: Denies rash, itching, oral ulcerations, hives, unhealing ulcers.  Psych: Denies depression, anxiety, memory loss, suicidal ideation, hallucinations,  and confusion. Heme: Denies bruising, bleeding, and enlarged lymph nodes. Neuro:  Denies any headaches, dizziness, paresthesias. Endo:  hypothyroid,Denies any problems with DM, adrenal function.  Physical Exam: Vital signs in last 24 hours: Temp:  [97.8 F (36.6 C)-98.4 F (36.9 C)] 98.4 F (36.9 C) (05/04 0829) Pulse Rate:  [57-66] 64 (05/04 0829) Resp:  [16-19] 18 (05/04 0829) BP: (141-159)/(54-64) 141/57 (05/04 0829) SpO2:  [92 %-96 %] 93 % (05/04 0829) Weight:  [71.4 kg] 71.4 kg (05/03 2239) Last BM Date: 07/11/18  General:   Alert,  Well-developed, well-nourished, pleasant and cooperative in NAD Head:  Normocephalic and atraumatic. Eyes:  Sclera clear, icterus.   Conjunctiva pink. Ears:  Normal auditory acuity. Nose:  No deformity, discharge,  or lesions. Mouth:  No deformity or lesions.  Oropharynx pink & moist. Neck:  Supple; no masses or thyromegaly. Lungs:  Clear throughout to auscultation.   No wheezes, crackles, or rhonchi. No acute distress. Heart:  Regular rate and rhythm; no murmurs, clicks, rubs,  or gallops. Extremities:  Without clubbing or edema. Neurologic:  Alert and  oriented x4;  grossly normal neurologically. Skin:  Intact without significant lesions or rashes. Psych:  Alert and cooperative. Normal mood and affect. Abdomen:  Soft, nontender and nondistended. No masses, hepatosplenomegaly or hernias noted. Normal bowel sounds, without guarding, and without rebound.         Lab Results: Recent Labs    07/12/18 1703 07/13/18 0350  07/14/18 0335  WBC 9.7 16.3* 8.9  HGB 13.2 12.7 11.8*  HCT 41.0 38.8 35.9*  PLT 253 229 196   BMET Recent Labs    07/13/18 0350 07/13/18 1630  07/14/18 0335  NA 136 139 140  K 5.0 4.4 4.4  CL 100 105 107  CO2 22 24 22   GLUCOSE 136* 89 79  BUN 24* 26* 21  CREATININE 1.46* 1.58* 1.48*  CALCIUM 9.0 9.0 8.8*   LFT Recent Labs    07/14/18 0335  PROT 6.4*  ALBUMIN 2.8*  AST 181*  ALT 268*  ALKPHOS 113  BILITOT 6.3*   PT/INR Recent Labs    07/12/18 2125  LABPROT 13.4  INR 1.0    Studies/Results: Nm Hepatobiliary Liver Func  Result Date: 07/13/2018 CLINICAL DATA:  Abdominal pain.  Epigastric pain. EXAM: NUCLEAR MEDICINE HEPATOBILIARY IMAGING TECHNIQUE: Sequential images of the abdomen were obtained out to 60 minutes following intravenous administration of radiopharmaceutical. RADIOPHARMACEUTICALS:  5.46 mCi Tc-22m  Choletec IV COMPARISON:  Right upper quadrant ultrasound Jul 12, 2018 FINDINGS: There is prompt uptake of radiotracer in the liver. No excretion is seen from the liver during the 4 hours of imaging. Very little background activity seen outside the liver. IMPRESSION: 1. The prompt uptake of radiotracer in the liver with no washout by 4 hours could be seen in the setting of high-grade common bile duct obstruction or severe underlying liver dysfunction. Given a dilated common bile duct, 10.8 mm, seen on the recent right upper quadrant ultrasound as well as the rapid uptake of radiotracer in the liver and the lack of background activity, a high-grade common bile duct obstruction is favored. Review of the CT scan from yesterday also demonstrates common bile duct dilatation in retrospect. An underlying cause for the dilatation is not identified on the CT scan. Recommend an MRCP or ERCP for better evaluation. These results will be called to the ordering clinician or representative by the Radiologist Assistant, and communication documented in the PACS or zVision Dashboard.  Electronically Signed   By: Dorise Bullion III M.D   On: 07/13/2018 15:52   US Abdomen Complete  Result Date: 07/12/2018 CLINICAL DATA:  Abdominal pain since 2 p.m. today. EXAM: ABDOMEN ULTRASOUND COMPLETE COMPARISON:  None. FINDINGS: Gallbladder: Cholelithiasis. No gallbladder wall thickening. No pericholecystic fluid. No sonographic Murphy sign. Common bile duct: Diameter: 10.8 mm.  No choledocholithiasis. Liver: No focal hepatic lesion. Mild coarse hepatic echotexture with normal contour. Portal vein is patent on color Doppler imaging with normal direction of blood flow towards the liver. IVC: No abnormality visualized. Pancreas: Limited visualization secondary to overlying bowel gas. Spleen: Size and appearance within normal limits. Right Kidney: Length: 8.9 cm. Renal cortical thinning. Echogenicity within normal limits. No mass or hydronephrosis visualized. Left Kidney: Length: 7.8 cm. Renal cortical thinning. Echogenicity within normal limits. No mass or hydronephrosis visualized. Abdominal aorta: No aneurysm visualized. Other findings: None. IMPRESSION: 1. Cholelithiasis without sonographic evidence of acute cholecystitis. 2. Bilateral renal atrophy. Electronically Signed   By: Kathreen Devoid   On: 07/12/2018 18:13   Dg Chest Port 1 View  Result Date: 07/12/2018 CLINICAL DATA:  Per [facility] pt coming from home ([facility]) after having a sudden onset of mid epigastric pain radiating into back after gardening. Patient given 324 aspirin, 8mg  zofran and 2 nitro with no relief. Patient alert and orientated x 4. EXAM: PORTABLE CHEST - 1 VIEW COMPARISON:  06/02/2015 FINDINGS: Lungs are clear. Heart size upper limits normal. Aortic Atherosclerosis (ICD10-170.0). No effusion. Visualized bones unremarkable. IMPRESSION: No acute cardiopulmonary disease. Electronically Signed   By: Lucrezia Europe M.D.   On: 07/12/2018 17:18   Ct Angio Chest/abd/pel For Dissection W And/or Wo  Contrast  Result Date:  07/12/2018 CLINICAL DATA:  Sudden onset of epigastric pain radiating into back after gardening. EXAM: CT ANGIOGRAPHY CHEST, ABDOMEN AND PELVIS TECHNIQUE: Multidetector CT imaging through the chest, abdomen and pelvis was performed using the standard protocol during bolus administration of intravenous contrast. Multiplanar reconstructed images and MIPs were obtained and reviewed to evaluate the vascular anatomy. CONTRAST:  20mL OMNIPAQUE IOHEXOL 350 MG/ML SOLN COMPARISON:  Chest x-ray Jul 12, 2018 FINDINGS: CTA CHEST FINDINGS Cardiovascular: Mild cardiomegaly. Coronary artery calcifications involve the LAD and circumflex coronary arteries. Visualized pulmonary arteries are normal in appearance with no filling defects or pulmonary emboli noted. Mild scattered atherosclerotic changes are seen in the thoracic aorta. No aneurysm or dissection in the thoracic aorta. Mediastinum/Nodes: No enlarged mediastinal, hilar, or axillary lymph nodes. Thyroid gland, trachea, and esophagus demonstrate no significant findings. Lungs/Pleura: Mild scarring in the medial apices bilaterally. No pneumothorax. Central airways are normal. There is a nodule in the lateral left lung base measuring 7 mm in mean diameter. No other nodules. No masses or infiltrates. Musculoskeletal: See below. Review of the MIP images confirms the above findings. CTA ABDOMEN AND PELVIS FINDINGS VASCULAR Aorta: The abdominal aorta is tortuous without aneurysm or dissection. Celiac: Mild atherosclerotic changes are seen near the origin of the celiac artery with no narrowing. SMA: Patent without evidence of aneurysm, dissection, vasculitis or significant stenosis. Renals: Both renal arteries are patent without evidence of aneurysm, dissection, vasculitis, fibromuscular dysplasia or significant stenosis. IMA: Patent without evidence of aneurysm, dissection, vasculitis or significant stenosis. Inflow: Patent without evidence of aneurysm, dissection, vasculitis or  significant stenosis. Veins: No obvious venous abnormality within the limitations of this arterial phase study. The iliac and proximal femoral arteries are normal in appearance with mild scattered atherosclerotic changes. Review of the MIP images confirms the above findings. NON-VASCULAR Hepatobiliary: Evaluation is limited due to arterial phase imaging. No liver masses are identified. The portal vein is not well assessed due to timing of contrast. The gallbladder is distended. A rounded region of high attenuation in the gallbladder is likely a stone or tumefactive sludge measuring up to 3.3 cm. No wall thickening or pericholecystic fluid noted. Pancreas: There is fatty infiltration of the pancreatic head of no acute significance. No suspicious masses identified or evidence of pancreatitis. Spleen: Normal in size without focal abnormality. Adrenals/Urinary Tract: Adrenal glands are normal. Bilateral renal cysts are identified. No hydronephrosis or acute perinephric stranding. No ureteral stones. The bladder is normal. Stomach/Bowel: The stomach is normal. A duodenal diverticulum is identified off the second portion of the duodenum without evidence of inflammation, of no acute significance. The small bowel is otherwise normal. Colonic diverticulosis is seen without diverticulitis. The appendix is normal. Lymphatic: No significant vascular findings are present. No enlarged abdominal or pelvic lymph nodes. Reproductive: Status post hysterectomy. No adnexal masses. Other: No abdominal wall hernia or abnormality. No abdominopelvic ascites. Musculoskeletal: L4 pars defects are noted with grade 2 anterolisthesis of L4 versus L5. No other malalignment. Severe degenerative changes in the lumbar spine with more mild degenerative changes in the thoracic spine. Lower lumbar facet degenerative changes identified. Other bones are unremarkable. Review of the MIP images confirms the above findings. IMPRESSION: 1. No aortic aneurysm  or dissection. 2. The gallbladder is somewhat distended. A rounded region of high attenuation in the gallbladder measuring 3.3 cm is likely a stone or tumefactive sludge. No wall thickening or pericholecystic fluid. If there is clinical concern for acute cholecystitis, recommend ultrasound. 3. Significant  degenerative changes in the lumbar spine. Scattered degenerative changes seen in the thoracic spine as well. 4. 7 mm nodule in the left lower lobe. Non-contrast chest CT at 6-12 months is recommended. If the nodule is stable at time of repeat CT, then future CT at 18-24 months (from today's scan) is considered optional for low-risk patients, but is recommended for high-risk patients. This recommendation follows the consensus statement: Guidelines for Management of Incidental Pulmonary Nodules Detected on CT Images: From the Fleischner Society 2017; Radiology 2017; 284:228-243. 5. Coronary artery calcifications in the LAD and circumflex arteries. 6. Colonic diverticulosis without diverticulitis. Electronically Signed   By: Dorise Bullion III M.D   On: 07/12/2018 19:59    Impression: Abnormal liver enzymes, dilated CBD, abnormal HIDA scan suspicious for a CBD stone. No fever, resolution of right upper quadrant abdominal pain, improving leukocytosis with IV antibiotics  Plan: ERCP today. The risks and the benefits of the procedure were discussed with the patient in details.  Understands and verbalizes consent. Surgical team on board for evaluation for cholecystectomy   LOS: 2 days   Ronnette Juniper, MD  07/14/2018, 11:09 AM  Pager [phone] If no answer or after 5 PM call [phone]

## 2018-07-14 NOTE — Anesthesia Procedure Notes (Signed)
Procedure Name: Intubation Date/Time: 07/14/2018 2:49 PM Performed by: Cleda Daub, CRNA Pre-anesthesia Checklist: Patient identified, Emergency Drugs available, Suction available and Patient being monitored Patient Re-evaluated:Patient Re-evaluated prior to induction Oxygen Delivery Method: Circle system utilized Preoxygenation: Pre-oxygenation with 100% oxygen Induction Type: IV induction and Rapid sequence Laryngoscope Size: Glidescope and 4 Grade View: Grade I Tube type: Oral Tube size: 7.0 mm Number of attempts: 1 Airway Equipment and Method: Stylet and Video-laryngoscopy Placement Confirmation: ETT inserted through vocal cords under direct vision,  positive ETCO2 and breath sounds checked- equal and bilateral Secured at: 20 cm Tube secured with: Tape Dental Injury: Teeth and Oropharynx as per pre-operative assessment

## 2018-07-14 NOTE — Consult Note (Signed)
[facility] Consult  Referring Provider: Reatha Harps, MD Primary Care Physician:  Lajean Manes, MD Primary Gastroenterologist: Sadie Haber Primary  Reason for Consultation:  CBD stone  HPI: April Hodges is a 85 y.o. female who lives at an independent living facility presented to the ER on 07/12/2018 with sudden onset of upper abdominal pain that radiated to her right upper quadrant and then to her back, described as intense, associated with few episodes of bilious vomiting.  Patient denies fever, chills.  Patient states she had similar symptoms a few months ago and initially thought that she was having a cardiac event.  After arrival of EMS she was given aspirin 324 mg and 2 doses of nitroglycerin without any relief.  She was found to have abnormal liver enzymes, normal troponin I, ultrasound showed cholelithiasis without evidence of acute cholecystitis, CBD of 10.8 mm, no choledocholithiasis.  Surgical consultation was obtained and recommended a HIDA scan which was performed yesterday and showed high-grade common bile duct obstruction or severe underlying liver dysfunction, given dilated CBD of 10.8 mm bile duct obstruction was favored and CAT scan from yesterday also showed common bile duct dilatation. CT of the chest abdomen and pelvis did not show aortic dissection, gallbladder appeared distended with 3.3 cm tumefactive sludge or stone. Meanwhile patient liver enzymes have trended up, T bili 6.3, AST 181, ALT 268,  ALP 113(was 5.8/301/350/122 yesterday), WBC count is trended down from 16.3-8.9. Patient denies unintentional weight loss, difficulty swallowing, pain on swallowing, acid reflux or heartburn.  No Prior EGD. Reports having colonoscopy in her 73s. Denies change in bowel habits, blood in stool or black stools. She is known to have sinus bradycardia but does not have a pacemaker or defibrillator.  Past Medical History:  Diagnosis Date  . Acute on chronic renal failure ([place])  06/03/2015  . Arthritis   . Cancer ([place])    skin cancer  . CKD (chronic kidney disease), stage III ([place])   . Demand ischemia ([place])    a. 05/2015 in setting of sepsis w/ troponin peak of 8.47;  b. 05/2015 Echo: EF 60-65%, no rwma, Gr1 DD-->No ischemic eval undertaken.  . Essential hypertension 06/03/2015  . GERD (gastroesophageal reflux disease)   . Headache    in younger years  . Hypothyroidism   . Pneumonia    as a child  . PONV (postoperative nausea and vomiting)   . Sinus bradycardia    a. states rates in 40's and low 50's are normal for her-->No AVN blocking agents.  . Systolic murmur    a. 05/6627 No sigificant valvular dzs on echo.  . Urosepsis 06/03/2015    Past Surgical History:  Procedure Laterality Date  . BILATERAL CARPAL TUNNEL RELEASE    . COLONOSCOPY    . EYE SURGERY Bilateral    cataracts removed and lens implant  . JOINT REPLACEMENT Bilateral    knees  . LUMBAR LAMINECTOMY/DECOMPRESSION MICRODISCECTOMY N/A 06/10/2014   Procedure: LUMBAR DECOMPRESSION L3 - L5 2 LEVELS;  Surgeon: Melina Schools, MD;  Location: [facility];  Service: Orthopedics;  Laterality: N/A;  . TONSILLECTOMY    . VAGINAL HYSTERECTOMY      Prior to Admission medications   Medication Sig Start Date End Date Taking? Authorizing Provider  acetaminophen (TYLENOL) 500 MG tablet Take 500 mg by mouth See admin instructions. 500mg  by mouth every morning and daily as needed for pain   Yes [provider]  diltiazem (CARDIZEM CD) 120 MG 24 hr capsule Take 120 mg by  mouth daily. 06/25/18  Yes [provider]  levothyroxine (SYNTHROID, LEVOTHROID) 125 MCG tablet Take 1 tablet (125 mcg total) by mouth daily. 06/05/15  Yes Bhagat, Bhavinkumar, PA  lisinopril (PRINIVIL,ZESTRIL) 20 MG tablet Take 1 tablet (20 mg total) by mouth daily. NEED OV. 02/25/18  Yes Skeet Latch, MD  Multiple Vitamins-Minerals (MULTIVITAMIN PO) Take 1 tablet by mouth daily.   Yes [provider]  omeprazole  (PRILOSEC) 10 MG capsule Take 10 mg by mouth daily. 06/25/18  Yes [provider]  furosemide (LASIX) 20 MG tablet TAKE 1 TABLET BY MOUTH EVERY DAY Patient not taking: Reported on 07/12/2018 01/23/18   Skeet Latch, MD  nitroGLYCERIN (NITROSTAT) 0.4 MG SL tablet Place 1 tablet (0.4 mg total) under the tongue every 5 (five) minutes x 3 doses as needed for chest pain. Patient not taking: Reported on 07/12/2018 06/05/15   Leanor Kail, PA    Current Facility-Administered Medications  Medication Dose Route Frequency Provider Last Rate Last Dose  . acetaminophen (TYLENOL) tablet 650 mg  650 mg Oral Q6H PRN Ivor Costa, MD       Or  . acetaminophen (TYLENOL) suppository 650 mg  650 mg Rectal Q6H PRN Ivor Costa, MD      . diltiazem (CARDIZEM CD) 24 hr capsule 120 mg  120 mg Oral Daily Ivor Costa, MD   120 mg at 07/14/18 1015  . enoxaparin (LOVENOX) injection 30 mg  30 mg Subcutaneous Q24H Domenic Polite, MD   30 mg at 07/13/18 1636  . hydrALAZINE (APRESOLINE) injection 5 mg  5 mg Intravenous Q2H PRN Ivor Costa, MD      . levothyroxine (SYNTHROID) tablet 125 mcg  125 mcg Oral Q0600 Ivor Costa, MD   125 mcg at 07/14/18 0526  . lisinopril (ZESTRIL) tablet 20 mg  20 mg Oral Daily Ivor Costa, MD   20 mg at 07/14/18 1015  . morphine 2 MG/ML injection 0.5 mg  0.5 mg Intravenous Q4H PRN Ivor Costa, MD      . Derrill Memo ON 07/15/2018] multivitamin with minerals tablet 1 tablet  1 tablet Oral Daily Domenic Polite, MD      . ondansetron [facility]) injection 4 mg  4 mg Intravenous Q8H PRN Ivor Costa, MD      . oxyCODONE-acetaminophen (PERCOCET/ROXICET) 5-325 MG per tablet 1 tablet  1 tablet Oral Q4H PRN Ivor Costa, MD      . pantoprazole (PROTONIX) EC tablet 40 mg  40 mg Oral Daily Ivor Costa, MD   40 mg at 07/14/18 1015  . piperacillin-tazobactam (ZOSYN) IVPB 3.375 g  3.375 g Intravenous Cleophas Dunker, MD 12.5 mL/hr at 07/14/18 0538 3.375 g at 07/14/18 0538    Allergies as of 07/12/2018 - Review  Complete 07/12/2018  Allergen Reaction Noted  . Amlodipine Swelling 10/07/2015  . Bystolic [nebivolol hcl] Other (See Comments) 10/07/2015    Family History  Problem Relation Age of Onset  . Congestive Heart Failure Mother   . Heart attack Father     Social History   Socioeconomic History  . Marital status: Married    Spouse name: Not on file  . Number of children: Not on file  . Years of education: Not on file  . Highest education level: Not on file  Occupational History  . Not on file  Social Needs  . Financial resource strain: Not on file  . Food insecurity:    Worry: Not on file    Inability: Not on file  . Transportation needs:  Medical: Not on file    Non-medical: Not on file  Tobacco Use  . Smoking status: Never Smoker  . Smokeless tobacco: Never Used  Substance and Sexual Activity  . Alcohol use: Yes    Comment: occ wine  . Drug use: No  . Sexual activity: Not on file  Lifestyle  . Physical activity:    Days per week: Not on file    Minutes per session: Not on file  . Stress: Not on file  Relationships  . Social connections:    Talks on phone: Not on file    Gets together: Not on file    Attends religious service: Not on file    Active member of club or organization: Not on file    Attends meetings of clubs or organizations: Not on file    Relationship status: Not on file  . Intimate partner violence:    Fear of current or ex partner: Not on file    Emotionally abused: Not on file    Physically abused: Not on file    Forced sexual activity: Not on file  Other Topics Concern  . Not on file  Social History Narrative  . Not on file    Review of Systems: Positive for: GI: Described in detail in HPI.    Gen: Denies any fever, chills, rigors, night sweats, anorexia, fatigue, weakness, malaise, involuntary weight loss, and sleep disorder CV: Denies chest pain, angina, palpitations, syncope, orthopnea, PND, peripheral edema, and claudication. Resp:  Denies dyspnea, cough, sputum, wheezing, coughing up blood. GU : Denies urinary burning, blood in urine, urinary frequency, urinary hesitancy, nocturnal urination, and urinary incontinence. MS: Denies joint pain or swelling.  Denies muscle weakness, cramps, atrophy.  Derm: Denies rash, itching, oral ulcerations, hives, unhealing ulcers.  Psych: Denies depression, anxiety, memory loss, suicidal ideation, hallucinations,  and confusion. Heme: Denies bruising, bleeding, and enlarged lymph nodes. Neuro:  Denies any headaches, dizziness, paresthesias. Endo:  hypothyroid,Denies any problems with DM, adrenal function.  Physical Exam: Vital signs in last 24 hours: Temp:  [97.8 F (36.6 C)-98.4 F (36.9 C)] 98.4 F (36.9 C) (05/04 0829) Pulse Rate:  [57-66] 64 (05/04 0829) Resp:  [16-19] 18 (05/04 0829) BP: (141-159)/(54-64) 141/57 (05/04 0829) SpO2:  [92 %-96 %] 93 % (05/04 0829) Weight:  [71.4 kg] 71.4 kg (05/03 2239) Last BM Date: 07/11/18  General:   Alert,  Well-developed, well-nourished, pleasant and cooperative in NAD Head:  Normocephalic and atraumatic. Eyes:  Sclera clear, icterus.   Conjunctiva pink. Ears:  Normal auditory acuity. Nose:  No deformity, discharge,  or lesions. Mouth:  No deformity or lesions.  Oropharynx pink & moist. Neck:  Supple; no masses or thyromegaly. Lungs:  Clear throughout to auscultation.   No wheezes, crackles, or rhonchi. No acute distress. Heart:  Regular rate and rhythm; no murmurs, clicks, rubs,  or gallops. Extremities:  Without clubbing or edema. Neurologic:  Alert and  oriented x4;  grossly normal neurologically. Skin:  Intact without significant lesions or rashes. Psych:  Alert and cooperative. Normal mood and affect. Abdomen:  Soft, nontender and nondistended. No masses, hepatosplenomegaly or hernias noted. Normal bowel sounds, without guarding, and without rebound.         Lab Results: Recent Labs    07/12/18 1703 07/13/18 0350  07/14/18 0335  WBC 9.7 16.3* 8.9  HGB 13.2 12.7 11.8*  HCT 41.0 38.8 35.9*  PLT 253 229 196   BMET Recent Labs    07/13/18 0350 07/13/18 1630  07/14/18 0335  NA 136 139 140  K 5.0 4.4 4.4  CL 100 105 107  CO2 22 24 22   GLUCOSE 136* 89 79  BUN 24* 26* 21  CREATININE 1.46* 1.58* 1.48*  CALCIUM 9.0 9.0 8.8*   LFT Recent Labs    07/14/18 0335  PROT 6.4*  ALBUMIN 2.8*  AST 181*  ALT 268*  ALKPHOS 113  BILITOT 6.3*   PT/INR Recent Labs    07/12/18 2125  LABPROT 13.4  INR 1.0    Studies/Results: Nm Hepatobiliary Liver Func  Result Date: 07/13/2018 CLINICAL DATA:  Abdominal pain.  Epigastric pain. EXAM: NUCLEAR MEDICINE HEPATOBILIARY IMAGING TECHNIQUE: Sequential images of the abdomen were obtained out to 60 minutes following intravenous administration of radiopharmaceutical. RADIOPHARMACEUTICALS:  5.46 mCi Tc-41m  Choletec IV COMPARISON:  Right upper quadrant ultrasound Jul 12, 2018 FINDINGS: There is prompt uptake of radiotracer in the liver. No excretion is seen from the liver during the 4 hours of imaging. Very little background activity seen outside the liver. IMPRESSION: 1. The prompt uptake of radiotracer in the liver with no washout by 4 hours could be seen in the setting of high-grade common bile duct obstruction or severe underlying liver dysfunction. Given a dilated common bile duct, 10.8 mm, seen on the recent right upper quadrant ultrasound as well as the rapid uptake of radiotracer in the liver and the lack of background activity, a high-grade common bile duct obstruction is favored. Review of the CT scan from yesterday also demonstrates common bile duct dilatation in retrospect. An underlying cause for the dilatation is not identified on the CT scan. Recommend an MRCP or ERCP for better evaluation. These results will be called to the ordering clinician or representative by the Radiologist Assistant, and communication documented in the PACS or zVision Dashboard.  Electronically Signed   By: Dorise Bullion III M.D   On: 07/13/2018 15:52   US Abdomen Complete  Result Date: 07/12/2018 CLINICAL DATA:  Abdominal pain since 2 p.m. today. EXAM: ABDOMEN ULTRASOUND COMPLETE COMPARISON:  None. FINDINGS: Gallbladder: Cholelithiasis. No gallbladder wall thickening. No pericholecystic fluid. No sonographic Murphy sign. Common bile duct: Diameter: 10.8 mm.  No choledocholithiasis. Liver: No focal hepatic lesion. Mild coarse hepatic echotexture with normal contour. Portal vein is patent on color Doppler imaging with normal direction of blood flow towards the liver. IVC: No abnormality visualized. Pancreas: Limited visualization secondary to overlying bowel gas. Spleen: Size and appearance within normal limits. Right Kidney: Length: 8.9 cm. Renal cortical thinning. Echogenicity within normal limits. No mass or hydronephrosis visualized. Left Kidney: Length: 7.8 cm. Renal cortical thinning. Echogenicity within normal limits. No mass or hydronephrosis visualized. Abdominal aorta: No aneurysm visualized. Other findings: None. IMPRESSION: 1. Cholelithiasis without sonographic evidence of acute cholecystitis. 2. Bilateral renal atrophy. Electronically Signed   By: Kathreen Devoid   On: 07/12/2018 18:13   Dg Chest Port 1 View  Result Date: 07/12/2018 CLINICAL DATA:  Per [facility] pt coming from home ([facility]) after having a sudden onset of mid epigastric pain radiating into back after gardening. Patient given 324 aspirin, 8mg  zofran and 2 nitro with no relief. Patient alert and orientated x 4. EXAM: PORTABLE CHEST - 1 VIEW COMPARISON:  06/02/2015 FINDINGS: Lungs are clear. Heart size upper limits normal. Aortic Atherosclerosis (ICD10-170.0). No effusion. Visualized bones unremarkable. IMPRESSION: No acute cardiopulmonary disease. Electronically Signed   By: Lucrezia Europe M.D.   On: 07/12/2018 17:18   Ct Angio Chest/abd/pel For Dissection W And/or Wo  Contrast  Result Date:  07/12/2018 CLINICAL DATA:  Sudden onset of epigastric pain radiating into back after gardening. EXAM: CT ANGIOGRAPHY CHEST, ABDOMEN AND PELVIS TECHNIQUE: Multidetector CT imaging through the chest, abdomen and pelvis was performed using the standard protocol during bolus administration of intravenous contrast. Multiplanar reconstructed images and MIPs were obtained and reviewed to evaluate the vascular anatomy. CONTRAST:  41mL OMNIPAQUE IOHEXOL 350 MG/ML SOLN COMPARISON:  Chest x-ray Jul 12, 2018 FINDINGS: CTA CHEST FINDINGS Cardiovascular: Mild cardiomegaly. Coronary artery calcifications involve the LAD and circumflex coronary arteries. Visualized pulmonary arteries are normal in appearance with no filling defects or pulmonary emboli noted. Mild scattered atherosclerotic changes are seen in the thoracic aorta. No aneurysm or dissection in the thoracic aorta. Mediastinum/Nodes: No enlarged mediastinal, hilar, or axillary lymph nodes. Thyroid gland, trachea, and esophagus demonstrate no significant findings. Lungs/Pleura: Mild scarring in the medial apices bilaterally. No pneumothorax. Central airways are normal. There is a nodule in the lateral left lung base measuring 7 mm in mean diameter. No other nodules. No masses or infiltrates. Musculoskeletal: See below. Review of the MIP images confirms the above findings. CTA ABDOMEN AND PELVIS FINDINGS VASCULAR Aorta: The abdominal aorta is tortuous without aneurysm or dissection. Celiac: Mild atherosclerotic changes are seen near the origin of the celiac artery with no narrowing. SMA: Patent without evidence of aneurysm, dissection, vasculitis or significant stenosis. Renals: Both renal arteries are patent without evidence of aneurysm, dissection, vasculitis, fibromuscular dysplasia or significant stenosis. IMA: Patent without evidence of aneurysm, dissection, vasculitis or significant stenosis. Inflow: Patent without evidence of aneurysm, dissection, vasculitis or  significant stenosis. Veins: No obvious venous abnormality within the limitations of this arterial phase study. The iliac and proximal femoral arteries are normal in appearance with mild scattered atherosclerotic changes. Review of the MIP images confirms the above findings. NON-VASCULAR Hepatobiliary: Evaluation is limited due to arterial phase imaging. No liver masses are identified. The portal vein is not well assessed due to timing of contrast. The gallbladder is distended. A rounded region of high attenuation in the gallbladder is likely a stone or tumefactive sludge measuring up to 3.3 cm. No wall thickening or pericholecystic fluid noted. Pancreas: There is fatty infiltration of the pancreatic head of no acute significance. No suspicious masses identified or evidence of pancreatitis. Spleen: Normal in size without focal abnormality. Adrenals/Urinary Tract: Adrenal glands are normal. Bilateral renal cysts are identified. No hydronephrosis or acute perinephric stranding. No ureteral stones. The bladder is normal. Stomach/Bowel: The stomach is normal. A duodenal diverticulum is identified off the second portion of the duodenum without evidence of inflammation, of no acute significance. The small bowel is otherwise normal. Colonic diverticulosis is seen without diverticulitis. The appendix is normal. Lymphatic: No significant vascular findings are present. No enlarged abdominal or pelvic lymph nodes. Reproductive: Status post hysterectomy. No adnexal masses. Other: No abdominal wall hernia or abnormality. No abdominopelvic ascites. Musculoskeletal: L4 pars defects are noted with grade 2 anterolisthesis of L4 versus L5. No other malalignment. Severe degenerative changes in the lumbar spine with more mild degenerative changes in the thoracic spine. Lower lumbar facet degenerative changes identified. Other bones are unremarkable. Review of the MIP images confirms the above findings. IMPRESSION: 1. No aortic aneurysm  or dissection. 2. The gallbladder is somewhat distended. A rounded region of high attenuation in the gallbladder measuring 3.3 cm is likely a stone or tumefactive sludge. No wall thickening or pericholecystic fluid. If there is clinical concern for acute cholecystitis, recommend ultrasound. 3. Significant  degenerative changes in the lumbar spine. Scattered degenerative changes seen in the thoracic spine as well. 4. 7 mm nodule in the left lower lobe. Non-contrast chest CT at 6-12 months is recommended. If the nodule is stable at time of repeat CT, then future CT at 18-24 months (from today's scan) is considered optional for low-risk patients, but is recommended for high-risk patients. This recommendation follows the consensus statement: Guidelines for Management of Incidental Pulmonary Nodules Detected on CT Images: From the Fleischner Society 2017; Radiology 2017; 284:228-243. 5. Coronary artery calcifications in the LAD and circumflex arteries. 6. Colonic diverticulosis without diverticulitis. Electronically Signed   By: Dorise Bullion III M.D   On: 07/12/2018 19:59    Impression: Abnormal liver enzymes, dilated CBD, abnormal HIDA scan suspicious for a CBD stone. No fever, resolution of right upper quadrant abdominal pain, improving leukocytosis with IV antibiotics  Plan: ERCP today. The risks and the benefits of the procedure were discussed with the patient in details.  Understands and verbalizes consent. Surgical team on board for evaluation for cholecystectomy   LOS: 2 days   Ronnette Juniper, MD  07/14/2018, 11:09 AM  Pager [phone] If no answer or after 5 PM call [phone]

## 2018-07-14 NOTE — Anesthesia Preprocedure Evaluation (Addendum)
Anesthesia Evaluation  Patient identified by MRN, date of birth, ID band Patient awake    Reviewed: Allergy & Precautions, NPO status , Patient's Chart, lab work & pertinent test results  History of Anesthesia Complications (+) PONV and history of anesthetic complications  Airway Mallampati: II  TM Distance: >3 FB Neck ROM: Full    Dental  (+) Teeth Intact, Dental Advisory Given   Pulmonary    breath sounds clear to auscultation       Cardiovascular hypertension, Pt. on medications +CHF   Rhythm:Regular Rate:Normal     Neuro/Psych  Headaches, negative psych ROS   GI/Hepatic Neg liver ROS, GERD  Medicated,  Endo/Other  Hypothyroidism   Renal/GU      Musculoskeletal  (+) Arthritis ,   Abdominal Normal abdominal exam  (+)   Peds  Hematology negative hematology ROS (+)   Anesthesia Other Findings   Reproductive/Obstetrics                            Lab Results  Component Value Date   WBC 8.9 07/14/2018   HGB 11.8 (L) 07/14/2018   HCT 35.9 (L) 07/14/2018   MCV 93.5 07/14/2018   PLT 196 07/14/2018   Echo: - Left ventricle: The cavity size was normal. There was mild   concentric hypertrophy. Systolic function was normal. The   estimated ejection fraction was in the range of 60% to 65%. Wall   motion was normal; there were no regional wall motion   abnormalities. Doppler parameters are consistent with abnormal   left ventricular relaxation (grade 1 diastolic dysfunction).   Doppler parameters are consistent with elevated ventricular   end-diastolic filling pressure. - Aortic valve: Trileaflet; mildly thickened leaflets. Sclerosis   without stenosis. Transvalvular velocity was within the normal   range. There was no stenosis. - Mitral valve: Calcified annulus. Mildly thickened leaflets .   There was mild regurgitation. - Left atrium: The atrium was mildly dilated. - Right atrium: The  atrium was normal in size. - Tricuspid valve: There was mild regurgitation. - Pulmonary arteries: Systolic pressure was within the normal   range. - Inferior vena cava: The vessel was normal in size. The   respirophasic diameter changes were in the normal range (= 50%),   consistent with normal central venous pressure. - Pericardium, extracardiac: There was no pericardial effusion.  Anesthesia Physical Anesthesia Plan  ASA: III  Anesthesia Plan: General   Post-op Pain Management:    Induction: Intravenous  PONV Risk Score and Plan: Ondansetron and Treatment may vary due to age or medical condition  Airway Management Planned: Oral ETT  Additional Equipment: None  Intra-op Plan:   Post-operative Plan: Extubation in OR  Informed Consent: I have reviewed the patients History and Physical, chart, labs and discussed the procedure including the risks, benefits and alternatives for the proposed anesthesia with the patient or authorized representative who has indicated his/her understanding and acceptance.     Dental advisory given  Plan Discussed with: CRNA  Anesthesia Plan Comments:        Anesthesia Quick Evaluation

## 2018-07-14 NOTE — Op Note (Signed)
[facility] Patient Name: April Hodges Procedure Date : 07/14/2018 MRN: [id number] Attending MD: Ronnette Juniper , MD Date of Birth: 03-08-1926 CSN: [id number] Age: 85 Admit Type: Inpatient Procedure:                ERCP Indications:              Abdominal pain of suspected biliary origin,                            Abnormal hepatobiliary scintigraphy, Biliary                            dilation on Ultrasound, Elevated liver enzymes Providers:                Ronnette Juniper, MD, Vista Lawman, RN, Charolette Child,                            Technician Referring MD:              Medicines:                Monitored Anesthesia Care Complications:            Significant bleeding - required epinephrine Estimated Blood Loss:     Estimated blood loss: 10 mL requiring treatment                            with epinephrine. Procedure:                Pre-Anesthesia Assessment:                           - Prior to the procedure, a History and Physical                            was performed, and patient medications and                            allergies were reviewed. The patient's tolerance of                            previous anesthesia was also reviewed. The risks                            and benefits of the procedure and the sedation                            options and risks were discussed with the patient.                            All questions were answered, and informed consent                            was obtained. Prior Anticoagulants: The patient has                            taken no previous  anticoagulant or antiplatelet                            agents except for aspirin(324 mg by EMS). ASA Grade                            Assessment: III - A patient with severe systemic                            disease. After reviewing the risks and benefits,                            the patient was deemed in satisfactory condition to                            undergo the  procedure.                           After obtaining informed consent, the scope was                            passed under direct vision. Throughout the                            procedure, the patient's blood pressure, pulse, and                            oxygen saturations were monitored continuously. The                            TJF-Q180V ([id number]) Olympus duodenoscope was                            introduced through the mouth, and used to inject                            contrast into and used to inject contrast into the                            bile duct. The ERCP was accomplished without                            difficulty. The patient tolerated the procedure                            well. Scope In: Scope Out: Findings:      The scout film was normal. The esophagus was successfully intubated       under direct vision. The scope was advanced to a normal major papilla in       the descending duodenum without detailed examination of the pharynx,       larynx and associated structures, and upper GI tract. The upper GI tract       was grossly normal.      A large duodenal diverticulum was noted and the ampullary  opening was       noted within the diverticulum.      The bile duct was deeply cannulated with the sphincterotome. Contrast       was injected. I personally interpreted the bile duct images. There was       brisk flow of contrast through the ducts. Image quality was excellent.       Contrast extended to the entire biliary tree. The common bile duct       contained stone(s).      The main bile duct was moderately dilated. The largest diameter was 10       mm. A straight Roadrunner wire was passed into the biliary tree. A 9 mm       biliary sphincterotomy was made with a braided sphincterotome using ERBE       electrocautery.      Major bleeding from the sphincterotomy required intervention.      The biliary tree was swept with a 12 mm balloon starting at the        bifurcation. Sludge was swept from the duct. One stone was removed. No       stones remained.      To help stop oozing post sphincterotomy and balloon sweep One 7 Fr by 5       cm plastic stent with a single external flap and a single internal flap       was placed 4.5 cm into the common bile duct. The stent was in good       position.      3 cc of 1:10,000 epinephrine was injected.      Bleeding stopped at the conclusion of the procedure and clot was noted       without further active ooze. Impression:               - The entire main bile duct was moderately dilated.                           - Choledocholithiasis was found. Complete removal                            was accomplished by biliary sphincterotomy and                            balloon extraction.                           - A biliary sphincterotomy was performed.                           - The biliary tree was swept.                           - One plastic stent was placed into the common bile                            duct.                           - Epinephrine used to stop active bleeding. Recommendation:           - Clear liquid  diet.                           - Refer to a Psychologist, sport and exercise.                           - Monitor H and H and transfuse as needed.                           If bleeding continues may need IR intervention.                           Repeat EGD in 6-8 weeks for removal of plastic CBD                            stent. Procedure Code(s):        --- Professional ---                           [phone], Endoscopic retrograde                            cholangiopancreatography (ERCP); with placement of                            endoscopic stent into biliary or pancreatic duct,                            including pre- and post-dilation and guide wire                            passage, when performed, including sphincterotomy,                            when performed, each stent                            [id number], Endoscopic retrograde                            cholangiopancreatography (ERCP); with removal of                            calculi/debris from biliary/pancreatic duct(s) Diagnosis Code(s):        --- Professional ---                           K83.8, Other specified diseases of biliary tract                           K80.50, Calculus of bile duct without cholangitis                            or cholecystitis without obstruction                           R10.9, Unspecified abdominal pain  R94.5, Abnormal results of liver function studies                           R74.8, Abnormal levels of other serum enzymes CPT copyright 2019 [employer]. All rights reserved. The codes documented in this report are preliminary and upon coder review may  be revised to meet current compliance requirements. Ronnette Juniper, MD 07/14/2018 3:37:04 PM This report has been signed electronically. Number of Addenda: 0

## 2018-07-14 NOTE — Progress Notes (Signed)
PROGRESS NOTE    April Hodges  WEX:[id number] DOB: 21-Jun-1925 DOA: 07/12/2018 PCP: Lajean Manes, MD  Brief Narrative: April Hodges is a 85 y.o. female with medical history significant of hypertension, hyperlipidemia, CKD stage III, GERD, hypothyroidism, sinus bradycardia, presented with abdominal pain, nausea vomiting. -CT noted distended gallbladder, gallstones   Assessment & Plan:  Choledocholithiasis, cholelithiasis and possible early cholangitis -CT and ultrasound on admission did not show CBD obstruction, however bilirubin and LFTs worsened, follow-up HIDA scan yesterday was concerning for high-grade CBD obstruction likely stone, bilirubin trending up, LFTs elevated as well -Gastroenterology consulted -Blood cultures growing E. [facility] consulted 5/3 p.m., I was notified this morning the patient is a [facility] hence [facility] consulted this am -Greatly appreciate GI  input, plan for ERCP today -Surgery following as well, will need lap chole too -  Essential hypertension: -IV Hydralazine prn -Continue home medications: Lisinopril -Continue Cardizem   Hypothyroidism; -Continue Synthroid  CKD (chronic kidney disease), stage III ([place]): stable.  Baseline creatinine 1.0-1.3. -Stable  Chronic diastolic CHF (congestive heart failure) ([place]): 2D echo 11/28/2017 showed EF 60 to 65% with grade 1 diastolic dysfunction.  -No evidence of volume overload, not on diuretics at baseline  Lung nodule: Incidental findings on CTA She will follow-up with PCP   DVT prophylaxis: Lovenox Code Status: Full code Family Communication: No family at bedside, called and updated with son Tiffani Kadow Disposition Plan: Home pending above work-up  Consultants:   General surgery   Procedures:   Antimicrobials:    Subjective: -Denies any abdominal pain nausea or vomiting, -No fevers or chills  Objective: Vitals:   07/13/18 1621 07/13/18  2239 07/14/18 0456 07/14/18 0829  BP: (!) 156/64 (!) 149/54 (!) 159/59 (!) 141/57  Pulse: 66 (!) 57 66 64  Resp: 18 19 16 18   Temp:  97.8 F (36.6 C) 97.8 F (36.6 C) 98.4 F (36.9 C)  TempSrc:  Oral Oral Oral  SpO2: 96% 94% 92% 93%  Weight:  71.4 kg    Height:        Intake/Output Summary (Last 24 hours) at 07/14/2018 1239 Last data filed at 07/14/2018 1153 Gross per 24 hour  Intake 120 ml  Output 1500 ml  Net -1380 ml   Filed Weights   07/12/18 1700 07/13/18 0450 07/13/18 2239  Weight: 71.2 kg 71 kg 71.4 kg    Examination:  Gen: Awake, Alert, Oriented X 3, laying in bed, no distress HEENT: + icterus Lungs: Good air movement bilaterally, CTAB CVS: RRR,No Gallops,Rubs or new Murmurs Abd: soft, Non tender, non distended, BS present Extremities: No edema Skin: no new rashes Psychiatry: Judgement and insight appear normal. Mood & affect appropriate.     Data Reviewed:   CBC: Recent Labs  Lab 07/12/18 1703 07/13/18 0350 07/14/18 0335  WBC 9.7 16.3* 8.9  NEUTROABS 7.9*  --   --   HGB 13.2 12.7 11.8*  HCT 41.0 38.8 35.9*  MCV 94.0 92.2 93.5  PLT 253 229 938   Basic Metabolic Panel: Recent Labs  Lab 07/12/18 1703 07/13/18 0350 07/13/18 1630 07/14/18 0335  NA 137 136 139 140  K 4.9 5.0 4.4 4.4  CL 102 100 105 107  CO2 22 22 24 22   GLUCOSE 135* 136* 89 79  BUN 26* 24* 26* 21  CREATININE 1.22* 1.46* 1.58* 1.48*  CALCIUM 9.6 9.0 9.0 8.8*   GFR: Estimated Creatinine Clearance: 21.7 mL/min (A) (by C-G formula based on SCr of  1.48 mg/dL (H)). Liver Function Tests: Recent Labs  Lab 07/12/18 1703 07/13/18 1630 07/14/18 0335  AST 177* 301* 181*  ALT 98* 350* 268*  ALKPHOS 109 122 113  BILITOT 1.7* 5.8* 6.3*  PROT 7.3 6.6 6.4*  ALBUMIN 3.6 3.0* 2.8*   Recent Labs  Lab 07/12/18 1703  LIPASE 33   No results for input(s): AMMONIA in the last 168 hours. Coagulation Profile: Recent Labs  Lab 07/12/18 2125  INR 1.0   Cardiac Enzymes: Recent Labs   Lab 07/12/18 1703  TROPONINI <0.03   BNP (last 3 results) No results for input(s): PROBNP in the last 8760 hours. HbA1C: No results for input(s): HGBA1C in the last 72 hours. CBG: No results for input(s): GLUCAP in the last 168 hours. Lipid Profile: No results for input(s): CHOL, HDL, LDLCALC, TRIG, CHOLHDL, LDLDIRECT in the last 72 hours. Thyroid Function Tests: No results for input(s): TSH, T4TOTAL, FREET4, T3FREE, THYROIDAB in the last 72 hours. Anemia Panel: No results for input(s): VITAMINB12, FOLATE, FERRITIN, TIBC, IRON, RETICCTPCT in the last 72 hours. Urine analysis:    Component Value Date/Time   COLORURINE YELLOW 07/12/2018 [place] 07/12/2018 1711   LABSPEC 1.016 07/12/2018 1711   PHURINE 7.0 07/12/2018 1711   GLUCOSEU NEGATIVE 07/12/2018 1711   HGBUR NEGATIVE 07/12/2018 1711   BILIRUBINUR NEGATIVE 07/12/2018 1711   KETONESUR NEGATIVE 07/12/2018 1711   PROTEINUR 30 (A) 07/12/2018 1711   NITRITE NEGATIVE 07/12/2018 1711   LEUKOCYTESUR NEGATIVE 07/12/2018 1711   Sepsis Labs: @LABRCNTIP (procalcitonin:4,lacticidven:4)  ) Recent Results (from the past 240 hour(s))  SARS Coronavirus 2 (CEPHEID - Performed in [place] hospital lab), Hosp Order     Status: None   Collection Time: 07/12/18  8:17 PM  Result Value Ref Range Status   SARS Coronavirus 2 NEGATIVE NEGATIVE Final    Comment: (NOTE) If result is NEGATIVE SARS-CoV-2 target nucleic acids are NOT DETECTED. The SARS-CoV-2 RNA is generally detectable in upper and lower  respiratory specimens during the acute phase of infection. The lowest  concentration of SARS-CoV-2 viral copies this assay can detect is 250  copies / mL. A negative result does not preclude SARS-CoV-2 infection  and should not be used as the sole basis for treatment or other  patient management decisions.  A negative result may occur with  improper specimen collection / handling, submission of specimen other  than  nasopharyngeal swab, presence of viral mutation(s) within the  areas targeted by this assay, and inadequate number of viral copies  (<250 copies / mL). A negative result must be combined with clinical  observations, patient history, and epidemiological information. If result is POSITIVE SARS-CoV-2 target nucleic acids are DETECTED. The SARS-CoV-2 RNA is generally detectable in upper and lower  respiratory specimens dur ing the acute phase of infection.  Positive  results are indicative of active infection with SARS-CoV-2.  Clinical  correlation with patient history and other diagnostic information is  necessary to determine patient infection status.  Positive results do  not rule out bacterial infection or co-infection with other viruses. If result is PRESUMPTIVE POSTIVE SARS-CoV-2 nucleic acids MAY BE PRESENT.   A presumptive positive result was obtained on the submitted specimen  and confirmed on repeat testing.  While 2019 novel coronavirus  (SARS-CoV-2) nucleic acids may be present in the submitted sample  additional confirmatory testing may be necessary for epidemiological  and / or clinical management purposes  to differentiate between  SARS-CoV-2 and other Sarbecovirus currently known  to infect humans.  If clinically indicated additional testing with an alternate test  methodology [id number]) is advised. The SARS-CoV-2 RNA is generally  detectable in upper and lower respiratory sp ecimens during the acute  phase of infection. The expected result is Negative. Fact Sheet for Patients:  StrictlyIdeas.no Fact Sheet for Healthcare Providers: BankingDealers.co.za This test is not yet approved or cleared by the [place] FDA and has been authorized for detection and/or diagnosis of SARS-CoV-2 by FDA under an Emergency Use Authorization (EUA).  This EUA will remain in effect (meaning this test can be used) for the duration of the  COVID-19 declaration under Section 564(b)(1) of the Act, 21 U.S.C. section 360bbb-3(b)(1), unless the authorization is terminated or revoked sooner. Performed at [facility], [facility] [address], [place], [place] [address]   Culture, blood (Routine X 2) w Reflex to ID Panel     Status: None (Preliminary result)   Collection Time: 07/12/18  9:25 PM  Result Value Ref Range Status   Specimen Description BLOOD LEFT HAND  Final   Special Requests   Final    BOTTLES DRAWN AEROBIC AND ANAEROBIC Blood Culture results may not be optimal due to an inadequate volume of blood received in culture bottles   Culture  Setup Time ANAEROBIC BOTTLE ONLY GRAM NEGATIVE RODS   Final   Culture   Final    GRAM NEGATIVE RODS IDENTIFICATION TO FOLLOW Performed at [facility], [place] [address] [place], [place] [address]    Report Status PENDING  Incomplete  Culture, blood (Routine X 2) w Reflex to ID Panel     Status: Abnormal (Preliminary result)   Collection Time: 07/12/18 10:07 PM  Result Value Ref Range Status   Specimen Description BLOOD RIGHT ANTECUBITAL  Final   Special Requests   Final    BOTTLES DRAWN AEROBIC AND ANAEROBIC Blood Culture adequate volume Performed at [facility], [place] [address], [place], [place] [address]    Culture  Setup Time   Final    IN BOTH AEROBIC AND ANAEROBIC BOTTLES GRAM NEGATIVE RODS    Culture ESCHERICHIA COLI (A)  Final   Report Status PENDING  Incomplete  Blood Culture ID Panel (Reflexed)     Status: Abnormal   Collection Time: 07/12/18 10:07 PM  Result Value Ref Range Status   Enterococcus species NOT DETECTED NOT DETECTED Final   Listeria monocytogenes NOT DETECTED NOT DETECTED Final   Staphylococcus species NOT DETECTED NOT DETECTED Final   Staphylococcus aureus (BCID) NOT DETECTED NOT DETECTED Final   Streptococcus species NOT DETECTED NOT DETECTED Final   Streptococcus agalactiae NOT DETECTED NOT DETECTED Final   Streptococcus pneumoniae NOT  DETECTED NOT DETECTED Final   Streptococcus pyogenes NOT DETECTED NOT DETECTED Final   Acinetobacter baumannii NOT DETECTED NOT DETECTED Final   Enterobacteriaceae species DETECTED (A) NOT DETECTED Final    Comment: Enterobacteriaceae represent a large family of gram-negative bacteria, not a single organism. CRITICAL RESULT CALLED TO, READ BACK BY AND VERIFIED WITH: MIKE MACCIA @ [id number] ON 07/13/18 BY ROBINSON Z.     Enterobacter cloacae complex NOT DETECTED NOT DETECTED Final   Escherichia coli DETECTED (A) NOT DETECTED Final    Comment: CRITICAL RESULT CALLED TO, READ BACK BY AND VERIFIED WITH: MIKE MACCIA @ 4081 ON 07/13/18 BY ROBINSON Z.     Klebsiella oxytoca NOT DETECTED NOT DETECTED Final   Klebsiella pneumoniae NOT DETECTED NOT DETECTED Final   Proteus species NOT DETECTED NOT  DETECTED Final   Serratia marcescens NOT DETECTED NOT DETECTED Final   Carbapenem resistance NOT DETECTED NOT DETECTED Final   Haemophilus influenzae NOT DETECTED NOT DETECTED Final   Neisseria meningitidis NOT DETECTED NOT DETECTED Final   Pseudomonas aeruginosa NOT DETECTED NOT DETECTED Final   Candida albicans NOT DETECTED NOT DETECTED Final   Candida glabrata NOT DETECTED NOT DETECTED Final   Candida krusei NOT DETECTED NOT DETECTED Final   Candida parapsilosis NOT DETECTED NOT DETECTED Final   Candida tropicalis NOT DETECTED NOT DETECTED Final    Comment: Performed at [facility], [place] [address], [place], [place] [address]  MRSA PCR Screening     Status: None   Collection Time: 07/12/18 11:46 PM  Result Value Ref Range Status   MRSA by PCR NEGATIVE NEGATIVE Final    Comment:        The GeneXpert MRSA Assay (FDA approved for NASAL specimens only), is one component of a comprehensive MRSA colonization surveillance program. It is not intended to diagnose MRSA infection nor to guide or monitor treatment for MRSA infections. Performed at [facility], [place] [address] [place], [place]  [address]          Radiology Studies: Nm Hepatobiliary Liver Func  Result Date: 07/13/2018 CLINICAL DATA:  Abdominal pain.  Epigastric pain. EXAM: NUCLEAR MEDICINE HEPATOBILIARY IMAGING TECHNIQUE: Sequential images of the abdomen were obtained out to 60 minutes following intravenous administration of radiopharmaceutical. RADIOPHARMACEUTICALS:  5.46 mCi Tc-35m  Choletec IV COMPARISON:  Right upper quadrant ultrasound Jul 12, 2018 FINDINGS: There is prompt uptake of radiotracer in the liver. No excretion is seen from the liver during the 4 hours of imaging. Very little background activity seen outside the liver. IMPRESSION: 1. The prompt uptake of radiotracer in the liver with no washout by 4 hours could be seen in the setting of high-grade common bile duct obstruction or severe underlying liver dysfunction. Given a dilated common bile duct, 10.8 mm, seen on the recent right upper quadrant ultrasound as well as the rapid uptake of radiotracer in the liver and the lack of background activity, a high-grade common bile duct obstruction is favored. Review of the CT scan from yesterday also demonstrates common bile duct dilatation in retrospect. An underlying cause for the dilatation is not identified on the CT scan. Recommend an MRCP or ERCP for better evaluation. These results will be called to the ordering clinician or representative by the Radiologist Assistant, and communication documented in the PACS or zVision Dashboard. Electronically Signed   By: Dorise Bullion III M.D   On: 07/13/2018 15:52   US Abdomen Complete  Result Date: 07/12/2018 CLINICAL DATA:  Abdominal pain since 2 p.m. today. EXAM: ABDOMEN ULTRASOUND COMPLETE COMPARISON:  None. FINDINGS: Gallbladder: Cholelithiasis. No gallbladder wall thickening. No pericholecystic fluid. No sonographic Murphy sign. Common bile duct: Diameter: 10.8 mm.  No choledocholithiasis. Liver: No focal hepatic lesion. Mild coarse hepatic echotexture with normal  contour. Portal vein is patent on color Doppler imaging with normal direction of blood flow towards the liver. IVC: No abnormality visualized. Pancreas: Limited visualization secondary to overlying bowel gas. Spleen: Size and appearance within normal limits. Right Kidney: Length: 8.9 cm. Renal cortical thinning. Echogenicity within normal limits. No mass or hydronephrosis visualized. Left Kidney: Length: 7.8 cm. Renal cortical thinning. Echogenicity within normal limits. No mass or hydronephrosis visualized. Abdominal aorta: No aneurysm visualized. Other findings: None. IMPRESSION: 1. Cholelithiasis without sonographic evidence of acute cholecystitis. 2. Bilateral renal atrophy.  Electronically Signed   By: Kathreen Devoid   On: 07/12/2018 18:13   Dg Chest Port 1 View  Result Date: 07/12/2018 CLINICAL DATA:  Per [facility] pt coming from home ([facility]) after having a sudden onset of mid epigastric pain radiating into back after gardening. Patient given 324 aspirin, 8mg  zofran and 2 nitro with no relief. Patient alert and orientated x 4. EXAM: PORTABLE CHEST - 1 VIEW COMPARISON:  06/02/2015 FINDINGS: Lungs are clear. Heart size upper limits normal. Aortic Atherosclerosis (ICD10-170.0). No effusion. Visualized bones unremarkable. IMPRESSION: No acute cardiopulmonary disease. Electronically Signed   By: Lucrezia Europe M.D.   On: 07/12/2018 17:18   Ct Angio Chest/abd/pel For Dissection W And/or Wo Contrast  Result Date: 07/12/2018 CLINICAL DATA:  Sudden onset of epigastric pain radiating into back after gardening. EXAM: CT ANGIOGRAPHY CHEST, ABDOMEN AND PELVIS TECHNIQUE: Multidetector CT imaging through the chest, abdomen and pelvis was performed using the standard protocol during bolus administration of intravenous contrast. Multiplanar reconstructed images and MIPs were obtained and reviewed to evaluate the vascular anatomy. CONTRAST:  48mL OMNIPAQUE IOHEXOL 350 MG/ML SOLN COMPARISON:  Chest x-ray Jul 12, 2018 FINDINGS: CTA CHEST FINDINGS Cardiovascular: Mild cardiomegaly. Coronary artery calcifications involve the LAD and circumflex coronary arteries. Visualized pulmonary arteries are normal in appearance with no filling defects or pulmonary emboli noted. Mild scattered atherosclerotic changes are seen in the thoracic aorta. No aneurysm or dissection in the thoracic aorta. Mediastinum/Nodes: No enlarged mediastinal, hilar, or axillary lymph nodes. Thyroid gland, trachea, and esophagus demonstrate no significant findings. Lungs/Pleura: Mild scarring in the medial apices bilaterally. No pneumothorax. Central airways are normal. There is a nodule in the lateral left lung base measuring 7 mm in mean diameter. No other nodules. No masses or infiltrates. Musculoskeletal: See below. Review of the MIP images confirms the above findings. CTA ABDOMEN AND PELVIS FINDINGS VASCULAR Aorta: The abdominal aorta is tortuous without aneurysm or dissection. Celiac: Mild atherosclerotic changes are seen near the origin of the celiac artery with no narrowing. SMA: Patent without evidence of aneurysm, dissection, vasculitis or significant stenosis. Renals: Both renal arteries are patent without evidence of aneurysm, dissection, vasculitis, fibromuscular dysplasia or significant stenosis. IMA: Patent without evidence of aneurysm, dissection, vasculitis or significant stenosis. Inflow: Patent without evidence of aneurysm, dissection, vasculitis or significant stenosis. Veins: No obvious venous abnormality within the limitations of this arterial phase study. The iliac and proximal femoral arteries are normal in appearance with mild scattered atherosclerotic changes. Review of the MIP images confirms the above findings. NON-VASCULAR Hepatobiliary: Evaluation is limited due to arterial phase imaging. No liver masses are identified. The portal vein is not well assessed due to timing of contrast. The gallbladder is distended. A rounded  region of high attenuation in the gallbladder is likely a stone or tumefactive sludge measuring up to 3.3 cm. No wall thickening or pericholecystic fluid noted. Pancreas: There is fatty infiltration of the pancreatic head of no acute significance. No suspicious masses identified or evidence of pancreatitis. Spleen: Normal in size without focal abnormality. Adrenals/Urinary Tract: Adrenal glands are normal. Bilateral renal cysts are identified. No hydronephrosis or acute perinephric stranding. No ureteral stones. The bladder is normal. Stomach/Bowel: The stomach is normal. A duodenal diverticulum is identified off the second portion of the duodenum without evidence of inflammation, of no acute significance. The small bowel is otherwise normal. Colonic diverticulosis is seen without diverticulitis. The appendix is normal. Lymphatic: No significant vascular findings are present. No enlarged abdominal or  pelvic lymph nodes. Reproductive: Status post hysterectomy. No adnexal masses. Other: No abdominal wall hernia or abnormality. No abdominopelvic ascites. Musculoskeletal: L4 pars defects are noted with grade 2 anterolisthesis of L4 versus L5. No other malalignment. Severe degenerative changes in the lumbar spine with more mild degenerative changes in the thoracic spine. Lower lumbar facet degenerative changes identified. Other bones are unremarkable. Review of the MIP images confirms the above findings. IMPRESSION: 1. No aortic aneurysm or dissection. 2. The gallbladder is somewhat distended. A rounded region of high attenuation in the gallbladder measuring 3.3 cm is likely a stone or tumefactive sludge. No wall thickening or pericholecystic fluid. If there is clinical concern for acute cholecystitis, recommend ultrasound. 3. Significant degenerative changes in the lumbar spine. Scattered degenerative changes seen in the thoracic spine as well. 4. 7 mm nodule in the left lower lobe. Non-contrast chest CT at 6-12 months  is recommended. If the nodule is stable at time of repeat CT, then future CT at 18-24 months (from today's scan) is considered optional for low-risk patients, but is recommended for high-risk patients. This recommendation follows the consensus statement: Guidelines for Management of Incidental Pulmonary Nodules Detected on CT Images: From the Fleischner Society 2017; Radiology 2017; 284:228-243. 5. Coronary artery calcifications in the LAD and circumflex arteries. 6. Colonic diverticulosis without diverticulitis. Electronically Signed   By: Dorise Bullion III M.D   On: 07/12/2018 19:59        Scheduled Meds: . diltiazem  120 mg Oral Daily  . enoxaparin (LOVENOX) injection  30 mg Subcutaneous Q24H  . fentaNYL      . glycopyrrolate      . levothyroxine  125 mcg Oral Q0600  . lisinopril  20 mg Oral Daily  . [START ON 07/15/2018] multivitamin with minerals  1 tablet Oral Daily  . pantoprazole  40 mg Oral Daily   Continuous Infusions: . piperacillin-tazobactam (ZOSYN)  IV 3.375 g (07/14/18 0538)     LOS: 2 days    Time spent: 31min    Domenic Polite, MD [facility]   07/14/2018, 12:39 PM

## 2018-07-14 NOTE — Brief Op Note (Signed)
07/12/2018 - 07/14/2018  3:26 PM  PATIENT:  April Hodges  85 y.o. female  PRE-OPERATIVE DIAGNOSIS:  CBD stone  POST-OPERATIVE DIAGNOSIS:  * No post-op diagnosis entered *  PROCEDURE:  Procedure(s): ENDOSCOPIC RETROGRADE CHOLANGIOPANCREATOGRAPHY (ERCP) (N/A) SPHINCTEROTOMY REMOVAL OF STONES SCLEROTHERAPY BILIARY STENT PLACEMENT  SURGEON:  Surgeon(s) and Role:    Ronnette Juniper, MD - Primary  PHYSICIAN ASSISTANT:   ASSISTANTS: Vista Lawman, RN, Charolette Child, Tech   ANESTHESIA:   MAC  EBL: 10 cc   BLOOD ADMINISTERED:none  DRAINS: none   LOCAL MEDICATIONS USED:  NONE  SPECIMEN:  No Specimen  DISPOSITION OF SPECIMEN:  N/A  COUNTS:  YES  TOURNIQUET:  * No tourniquets in log *  DICTATION: .Dragon Dictation  PLAN OF CARE: Admit to inpatient   PATIENT DISPOSITION:  PACU - hemodynamically stable.   Delay start of Pharmacological VTE agent (>24hrs) due to surgical blood loss or risk of bleeding: yes

## 2018-07-15 ENCOUNTER — Encounter (HOSPITAL_COMMUNITY): Payer: Self-pay | Admitting: Gastroenterology

## 2018-07-15 LAB — CBC
HCT: 37.2 % (ref 36.0–46.0)
Hemoglobin: 12.1 g/dL (ref 12.0–15.0)
MCH: 30.1 pg (ref 26.0–34.0)
MCHC: 32.5 g/dL (ref 30.0–36.0)
MCV: 92.5 fL (ref 80.0–100.0)
Platelets: 203 10*3/uL (ref 150–400)
RBC: 4.02 MIL/uL (ref 3.87–5.11)
RDW: 12.9 % (ref 11.5–15.5)
WBC: 7.9 10*3/uL (ref 4.0–10.5)
nRBC: 0 % (ref 0.0–0.2)

## 2018-07-15 LAB — CULTURE, BLOOD (ROUTINE X 2): Special Requests: ADEQUATE

## 2018-07-15 LAB — COMPREHENSIVE METABOLIC PANEL
ALT: 190 U/L — ABNORMAL HIGH (ref 0–44)
AST: 100 U/L — ABNORMAL HIGH (ref 15–41)
Albumin: 2.8 g/dL — ABNORMAL LOW (ref 3.5–5.0)
Alkaline Phosphatase: 130 U/L — ABNORMAL HIGH (ref 38–126)
Anion gap: 12 (ref 5–15)
BUN: 24 mg/dL — ABNORMAL HIGH (ref 8–23)
CO2: 23 mmol/L (ref 22–32)
Calcium: 9 mg/dL (ref 8.9–10.3)
Chloride: 103 mmol/L (ref 98–111)
Creatinine, Ser: 1.58 mg/dL — ABNORMAL HIGH (ref 0.44–1.00)
GFR calc Af Amer: 33 mL/min — ABNORMAL LOW (ref >60)
GFR calc non Af Amer: 29 mL/min — ABNORMAL LOW (ref >60)
Glucose, Bld: 110 mg/dL — ABNORMAL HIGH (ref 70–99)
Potassium: 4.1 mmol/L (ref 3.5–5.1)
Sodium: 138 mmol/L (ref 135–145)
Total Bilirubin: 5.7 mg/dL — ABNORMAL HIGH (ref 0.3–1.2)
Total Protein: 6.7 g/dL (ref 6.5–8.1)

## 2018-07-15 LAB — TYPE AND SCREEN
ABO/RH(D): B POS
Antibody Screen: NEGATIVE
Unit division: 0

## 2018-07-15 LAB — BPAM RBC
Blood Product Expiration Date: 202005172359
Unit Type and Rh: 7300

## 2018-07-15 MED ORDER — CEFAZOLIN SODIUM-DEXTROSE 2-4 GM/100ML-% IV SOLN
2.0000 g | INTRAVENOUS | Status: AC
  Administered 2018-07-16: 2 g via INTRAVENOUS
  Filled 2018-07-15 (×2): qty 100

## 2018-07-15 MED ORDER — FUROSEMIDE 10 MG/ML IJ SOLN
20.0000 mg | Freq: Once | INTRAMUSCULAR | Status: AC
  Administered 2018-07-15: 20 mg via INTRAVENOUS
  Filled 2018-07-15: qty 2

## 2018-07-15 MED ORDER — EPINEPHRINE 1 MG/10ML IJ SOSY
PREFILLED_SYRINGE | INTRAMUSCULAR | Status: AC
  Filled 2018-07-15: qty 10

## 2018-07-15 MED ORDER — CHLORHEXIDINE GLUCONATE CLOTH 2 % EX PADS
6.0000 | MEDICATED_PAD | Freq: Once | CUTANEOUS | Status: AC
  Administered 2018-07-16: 6 via TOPICAL

## 2018-07-15 MED ORDER — CHLORHEXIDINE GLUCONATE CLOTH 2 % EX PADS
6.0000 | MEDICATED_PAD | Freq: Once | CUTANEOUS | Status: AC
  Administered 2018-07-15: 6 via TOPICAL

## 2018-07-15 NOTE — Progress Notes (Signed)
Subjective: The patient was seen and examined at bedside. She reports doing well, denies abdominal pain, nausea or vomiting, not had a bowel movement in 3 days, has been passing flatus.  Objective: Vital signs in last 24 hours: Temp:  [97.5 F (36.4 C)-98.8 F (37.1 C)] 97.7 F (36.5 C) (05/05 0803) Pulse Rate:  [57-103] 103 (05/05 0803) Resp:  [16-20] 16 (05/05 0803) BP: (146-192)/(45-86) 146/86 (05/05 0803) SpO2:  [92 %-98 %] 94 % (05/05 0803) Weight:  [71.4 kg] 71.4 kg (05/04 1244) Weight change: 0 kg Last BM Date: 07/11/18  PE: Mild icterus, no pallor GENERAL: Not in distress ABDOMEN: Soft, nontender, normoactive bowel sounds EXTREMITIES: No deformity, no edema  Lab Results: Results for orders placed or performed during the hospital encounter of 07/12/18 (from the past 48 hour(s))  Comprehensive metabolic panel     Status: Abnormal   Collection Time: 07/13/18  4:30 PM  Result Value Ref Range   Sodium 139 135 - 145 mmol/L   Potassium 4.4 3.5 - 5.1 mmol/L   Chloride 105 98 - 111 mmol/L   CO2 24 22 - 32 mmol/L   Glucose, Bld 89 70 - 99 mg/dL   BUN 26 (H) 8 - 23 mg/dL   Creatinine, Ser 1.58 (H) 0.44 - 1.00 mg/dL   Calcium 9.0 8.9 - 10.3 mg/dL   Total Protein 6.6 6.5 - 8.1 g/dL   Albumin 3.0 (L) 3.5 - 5.0 g/dL   AST 301 (H) 15 - 41 U/L   ALT 350 (H) 0 - 44 U/L   Alkaline Phosphatase 122 38 - 126 U/L   Total Bilirubin 5.8 (H) 0.3 - 1.2 mg/dL    Comment: DELTA CHECK NOTED   GFR calc non Af Amer 29 (L) >60 mL/min   GFR calc Af Amer 33 (L) >60 mL/min   Anion gap 10 5 - 15    Comment: Performed at [facility], [address], [place], [place] [address]  CBC     Status: Abnormal   Collection Time: 07/14/18  3:35 AM  Result Value Ref Range   WBC 8.9 4.0 - 10.5 K/uL   RBC 3.84 (L) 3.87 - 5.11 MIL/uL   Hemoglobin 11.8 (L) 12.0 - 15.0 g/dL   HCT 35.9 (L) 36.0 - 46.0 %   MCV 93.5 80.0 - 100.0 fL   MCH 30.7 26.0 - 34.0 pg   MCHC 32.9 30.0 - 36.0 g/dL   RDW 13.2 11.5  - 15.5 %   Platelets 196 150 - 400 K/uL   nRBC 0.0 0.0 - 0.2 %    Comment: Performed at [facility], [place] [address], [place], [place] [address]  Comprehensive metabolic panel     Status: Abnormal   Collection Time: 07/14/18  3:35 AM  Result Value Ref Range   Sodium 140 135 - 145 mmol/L   Potassium 4.4 3.5 - 5.1 mmol/L   Chloride 107 98 - 111 mmol/L   CO2 22 22 - 32 mmol/L   Glucose, Bld 79 70 - 99 mg/dL   BUN 21 8 - 23 mg/dL   Creatinine, Ser 1.48 (H) 0.44 - 1.00 mg/dL   Calcium 8.8 (L) 8.9 - 10.3 mg/dL   Total Protein 6.4 (L) 6.5 - 8.1 g/dL   Albumin 2.8 (L) 3.5 - 5.0 g/dL   AST 181 (H) 15 - 41 U/L   ALT 268 (H) 0 - 44 U/L   Alkaline Phosphatase 113 38 - 126 U/L   Total Bilirubin  6.3 (H) 0.3 - 1.2 mg/dL   GFR calc non Af Amer 31 (L) >60 mL/min   GFR calc Af Amer 36 (L) >60 mL/min   Anion gap 11 5 - 15    Comment: Performed at [place] [address], [place], [place] [address]  Prepare RBC     Status: None   Collection Time: 07/14/18  3:25 PM  Result Value Ref Range   Order Confirmation      ORDER PROCESSED BY BLOOD BANK Performed at [facility], [place] [address] [place], [place] [address]   CBC     Status: None   Collection Time: 07/15/18  4:19 AM  Result Value Ref Range   WBC 7.9 4.0 - 10.5 K/uL   RBC 4.02 3.87 - 5.11 MIL/uL   Hemoglobin 12.1 12.0 - 15.0 g/dL   HCT 37.2 36.0 - 46.0 %   MCV 92.5 80.0 - 100.0 fL   MCH 30.1 26.0 - 34.0 pg   MCHC 32.5 30.0 - 36.0 g/dL   RDW 12.9 11.5 - 15.5 %   Platelets 203 150 - 400 K/uL   nRBC 0.0 0.0 - 0.2 %    Comment: Performed at [facility], [place] [address], [place], [place] [address]  Comprehensive metabolic panel     Status: Abnormal   Collection Time: 07/15/18  4:19 AM  Result Value Ref Range   Sodium 138 135 - 145 mmol/L   Potassium 4.1 3.5 - 5.1 mmol/L   Chloride 103 98 - 111 mmol/L   CO2 23 22 - 32 mmol/L   Glucose, Bld 110 (H) 70 - 99 mg/dL   BUN 24 (H) 8 - 23 mg/dL   Creatinine, Ser  1.58 (H) 0.44 - 1.00 mg/dL   Calcium 9.0 8.9 - 10.3 mg/dL   Total Protein 6.7 6.5 - 8.1 g/dL   Albumin 2.8 (L) 3.5 - 5.0 g/dL   AST 100 (H) 15 - 41 U/L   ALT 190 (H) 0 - 44 U/L   Alkaline Phosphatase 130 (H) 38 - 126 U/L   Total Bilirubin 5.7 (H) 0.3 - 1.2 mg/dL   GFR calc non Af Amer 29 (L) >60 mL/min   GFR calc Af Amer 33 (L) >60 mL/min   Anion gap 12 5 - 15    Comment: Performed at [facility], [place] [address], [place], [place] [address]    Studies/Results: Nm Hepatobiliary Liver Func  Result Date: 07/13/2018 CLINICAL DATA:  Abdominal pain.  Epigastric pain. EXAM: NUCLEAR MEDICINE HEPATOBILIARY IMAGING TECHNIQUE: Sequential images of the abdomen were obtained out to 60 minutes following intravenous administration of radiopharmaceutical. RADIOPHARMACEUTICALS:  5.46 mCi Tc-54m  Choletec IV COMPARISON:  Right upper quadrant ultrasound Jul 12, 2018 FINDINGS: There is prompt uptake of radiotracer in the liver. No excretion is seen from the liver during the 4 hours of imaging. Very little background activity seen outside the liver. IMPRESSION: 1. The prompt uptake of radiotracer in the liver with no washout by 4 hours could be seen in the setting of high-grade common bile duct obstruction or severe underlying liver dysfunction. Given a dilated common bile duct, 10.8 mm, seen on the recent right upper quadrant ultrasound as well as the rapid uptake of radiotracer in the liver and the lack of background activity, a high-grade common bile duct obstruction is favored. Review of the CT scan from yesterday also demonstrates common bile duct dilatation in retrospect. An underlying cause for the dilatation  is not identified on the CT scan. Recommend an MRCP or ERCP for better evaluation. These results will be called to the ordering clinician or representative by the Radiologist Assistant, and communication documented in the PACS or zVision Dashboard. Electronically Signed   By: Dorise Bullion III M.D    On: 07/13/2018 15:52   Dg Ercp Biliary & Pancreatic Ducts  Result Date: 07/14/2018 CLINICAL DATA:  Choledocholithiasis, biliary obstruction EXAM: ERCP sphincterotomy, balloon extraction, and endoscopic stent placement TECHNIQUE: Multiple spot images obtained with the fluoroscopic device and submitted for interpretation post-procedure. FLUOROSCOPY TIME:  Fluoroscopy Time:  1 minutes 49 seconds Radiation Exposure Index (if provided by the fluoroscopic device): Not available Number of Acquired Spot Images: 4 COMPARISON:  07/12/2018 FINDINGS: Limited ERCP imaging during the procedure. Diffuse biliary dilatation. Nonobstructing filling defects in the mid CBD. Balloon extraction performed for stone removal. Endoscopic temporary biliary stent in place in the distal CBD. Please refer to the ERCP notes for further details of the intervention. IMPRESSION: Choledocholithiasis and biliary dilatation. These images were submitted for radiologic interpretation only. Please see the procedural report for the amount of contrast and the fluoroscopy time utilized. Electronically Signed   By: Jerilynn Mages.  Shick M.D.   On: 07/14/2018 16:04    Medications: I have reviewed the patient's current medications.  Assessment: ERCP performed yesterday with sphincterotomy, bleeding for sphincterotomy had to be controlled with use of epinephrine and plastic stent placement. LFTs trending down T bili/AST/ALT/ALP 5.7/100/190/130 today  Renal impairment  Plan: Patient being planned for cholecystectomy on this admission. Will follow-up trend of LFT. Plastic CBD stent needs to be removed as an outpatient, depending upon her liver enzymes decide whether she just needs an EGD for stent removal or a repeat ERCP.   Ronnette Juniper 07/15/2018, 10:40 AM   Pager [phone] If no answer or after 5 PM call [phone]

## 2018-07-15 NOTE — Progress Notes (Signed)
1 Day Post-Op   Subjective/Chief Complaint: Abdominal pain Patient doing well 1 day postop ERCP for choledocholithiasis.  She has no abdominal pain today.  She is tolerating a clear diet without difficulty.   Objective: Vital signs in last 24 hours: Temp:  [97.5 F (36.4 C)-98.8 F (37.1 C)] 97.7 F (36.5 C) (05/05 0803) Pulse Rate:  [57-103] 103 (05/05 0803) Resp:  [16-20] 16 (05/05 0803) BP: (146-192)/(45-86) 146/86 (05/05 0803) SpO2:  [92 %-98 %] 94 % (05/05 0803) Weight:  [71.4 kg] 71.4 kg (05/04 1244) Last BM Date: 07/11/18  Intake/Output from previous day: 05/04 0701 - 05/05 0700 In: 803.3 [I.V.:550; IV Piggyback:253.3] Out: 705 [Urine:700; Blood:5] Intake/Output this shift: Total I/O In: 270 [P.O.:270] Out: -   General appearance: alert and cooperative Resp: clear to auscultation bilaterally GI: soft, non-tender; bowel sounds normal; no masses,  no organomegaly  Lab Results:  Recent Labs    07/14/18 0335 07/15/18 0419  WBC 8.9 7.9  HGB 11.8* 12.1  HCT 35.9* 37.2  PLT 196 203   BMET Recent Labs    07/14/18 0335 07/15/18 0419  NA 140 138  K 4.4 4.1  CL 107 103  CO2 22 23  GLUCOSE 79 110*  BUN 21 24*  CREATININE 1.48* 1.58*  CALCIUM 8.8* 9.0   PT/INR Recent Labs    07/12/18 2125  LABPROT 13.4  INR 1.0   ABG No results for input(s): PHART, HCO3 in the last 72 hours.  Invalid input(s): PCO2, PO2  Studies/Results: Nm Hepatobiliary Liver Func  Result Date: 07/13/2018 CLINICAL DATA:  Abdominal pain.  Epigastric pain. EXAM: NUCLEAR MEDICINE HEPATOBILIARY IMAGING TECHNIQUE: Sequential images of the abdomen were obtained out to 60 minutes following intravenous administration of radiopharmaceutical. RADIOPHARMACEUTICALS:  5.46 mCi Tc-67m  Choletec IV COMPARISON:  Right upper quadrant ultrasound Jul 12, 2018 FINDINGS: There is prompt uptake of radiotracer in the liver. No excretion is seen from the liver during the 4 hours of imaging. Very little  background activity seen outside the liver. IMPRESSION: 1. The prompt uptake of radiotracer in the liver with no washout by 4 hours could be seen in the setting of high-grade common bile duct obstruction or severe underlying liver dysfunction. Given a dilated common bile duct, 10.8 mm, seen on the recent right upper quadrant ultrasound as well as the rapid uptake of radiotracer in the liver and the lack of background activity, a high-grade common bile duct obstruction is favored. Review of the CT scan from yesterday also demonstrates common bile duct dilatation in retrospect. An underlying cause for the dilatation is not identified on the CT scan. Recommend an MRCP or ERCP for better evaluation. These results will be called to the ordering clinician or representative by the Radiologist Assistant, and communication documented in the PACS or zVision Dashboard. Electronically Signed   By: Dorise Bullion III M.D   On: 07/13/2018 15:52   Dg Ercp Biliary & Pancreatic Ducts  Result Date: 07/14/2018 CLINICAL DATA:  Choledocholithiasis, biliary obstruction EXAM: ERCP sphincterotomy, balloon extraction, and endoscopic stent placement TECHNIQUE: Multiple spot images obtained with the fluoroscopic device and submitted for interpretation post-procedure. FLUOROSCOPY TIME:  Fluoroscopy Time:  1 minutes 49 seconds Radiation Exposure Index (if provided by the fluoroscopic device): Not available Number of Acquired Spot Images: 4 COMPARISON:  07/12/2018 FINDINGS: Limited ERCP imaging during the procedure. Diffuse biliary dilatation. Nonobstructing filling defects in the mid CBD. Balloon extraction performed for stone removal. Endoscopic temporary biliary stent in place in the distal CBD. Please  refer to the ERCP notes for further details of the intervention. IMPRESSION: Choledocholithiasis and biliary dilatation. These images were submitted for radiologic interpretation only. Please see the procedural report for the amount of  contrast and the fluoroscopy time utilized. Electronically Signed   By: Jerilynn Mages.  Shick M.D.   On: 07/14/2018 16:04    Anti-infectives: Anti-infectives (From admission, onward)   Start     Dose/Rate Route Frequency Ordered Stop   07/13/18 0600  piperacillin-tazobactam (ZOSYN) IVPB 3.375 g     3.375 g 12.5 mL/hr over 240 Minutes Intravenous Every 8 hours 07/12/18 2127     07/12/18 2030  piperacillin-tazobactam (ZOSYN) IVPB 3.375 g     3.375 g 100 mL/hr over 30 Minutes Intravenous  Once 07/12/18 2017 07/12/18 2329      Assessment/Plan: Choledocholithiasis status post ERCP  Discussed laparoscopic cholecystectomy with the patient today.  The pros and cons of surgery especially with her advanced age were discussed today.  Overall, she is in relatively good health I think would benefit from laparoscopic cholecystectomy.The procedure has been discussed with the patient. Operative and non operative treatments have been discussed. Risks of surgery include bleeding, infection,  Common bile duct injury,  Injury to the stomach,liver, colon,small intestine, abdominal wall,  Diaphragm,  Major blood vessels,  And the need for an open procedure.  Other risks include worsening of medical problems, death,  DVT and pulmonary embolism, and cardiovascular events.   Medical options have also been discussed. The patient has been informed of long term expectations of surgery and non surgical options,  The patient agrees to proceed.     LOS: 3 days    Joyice Faster Raveena Hebdon 07/15/2018

## 2018-07-15 NOTE — H&P (View-Only) (Signed)
1 Day Post-Op   Subjective/Chief Complaint: Abdominal pain Patient doing well 1 day postop ERCP for choledocholithiasis.  She has no abdominal pain today.  She is tolerating a clear diet without difficulty.   Objective: Vital signs in last 24 hours: Temp:  [97.5 F (36.4 C)-98.8 F (37.1 C)] 97.7 F (36.5 C) (05/05 0803) Pulse Rate:  [57-103] 103 (05/05 0803) Resp:  [16-20] 16 (05/05 0803) BP: (146-192)/(45-86) 146/86 (05/05 0803) SpO2:  [92 %-98 %] 94 % (05/05 0803) Weight:  [71.4 kg] 71.4 kg (05/04 1244) Last BM Date: 07/11/18  Intake/Output from previous day: 05/04 0701 - 05/05 0700 In: 803.3 [I.V.:550; IV Piggyback:253.3] Out: 705 [Urine:700; Blood:5] Intake/Output this shift: Total I/O In: 270 [P.O.:270] Out: -   General appearance: alert and cooperative Resp: clear to auscultation bilaterally GI: soft, non-tender; bowel sounds normal; no masses,  no organomegaly  Lab Results:  Recent Labs    07/14/18 0335 07/15/18 0419  WBC 8.9 7.9  HGB 11.8* 12.1  HCT 35.9* 37.2  PLT 196 203   BMET Recent Labs    07/14/18 0335 07/15/18 0419  NA 140 138  K 4.4 4.1  CL 107 103  CO2 22 23  GLUCOSE 79 110*  BUN 21 24*  CREATININE 1.48* 1.58*  CALCIUM 8.8* 9.0   PT/INR Recent Labs    07/12/18 2125  LABPROT 13.4  INR 1.0   ABG No results for input(s): PHART, HCO3 in the last 72 hours.  Invalid input(s): PCO2, PO2  Studies/Results: Nm Hepatobiliary Liver Func  Result Date: 07/13/2018 CLINICAL DATA:  Abdominal pain.  Epigastric pain. EXAM: NUCLEAR MEDICINE HEPATOBILIARY IMAGING TECHNIQUE: Sequential images of the abdomen were obtained out to 60 minutes following intravenous administration of radiopharmaceutical. RADIOPHARMACEUTICALS:  5.46 mCi Tc-37m  Choletec IV COMPARISON:  Right upper quadrant ultrasound Jul 12, 2018 FINDINGS: There is prompt uptake of radiotracer in the liver. No excretion is seen from the liver during the 4 hours of imaging. Very little  background activity seen outside the liver. IMPRESSION: 1. The prompt uptake of radiotracer in the liver with no washout by 4 hours could be seen in the setting of high-grade common bile duct obstruction or severe underlying liver dysfunction. Given a dilated common bile duct, 10.8 mm, seen on the recent right upper quadrant ultrasound as well as the rapid uptake of radiotracer in the liver and the lack of background activity, a high-grade common bile duct obstruction is favored. Review of the CT scan from yesterday also demonstrates common bile duct dilatation in retrospect. An underlying cause for the dilatation is not identified on the CT scan. Recommend an MRCP or ERCP for better evaluation. These results will be called to the ordering clinician or representative by the Radiologist Assistant, and communication documented in the PACS or zVision Dashboard. Electronically Signed   By: Dorise Bullion III M.D   On: 07/13/2018 15:52   Dg Ercp Biliary & Pancreatic Ducts  Result Date: 07/14/2018 CLINICAL DATA:  Choledocholithiasis, biliary obstruction EXAM: ERCP sphincterotomy, balloon extraction, and endoscopic stent placement TECHNIQUE: Multiple spot images obtained with the fluoroscopic device and submitted for interpretation post-procedure. FLUOROSCOPY TIME:  Fluoroscopy Time:  1 minutes 49 seconds Radiation Exposure Index (if provided by the fluoroscopic device): Not available Number of Acquired Spot Images: 4 COMPARISON:  07/12/2018 FINDINGS: Limited ERCP imaging during the procedure. Diffuse biliary dilatation. Nonobstructing filling defects in the mid CBD. Balloon extraction performed for stone removal. Endoscopic temporary biliary stent in place in the distal CBD. Please  refer to the ERCP notes for further details of the intervention. IMPRESSION: Choledocholithiasis and biliary dilatation. These images were submitted for radiologic interpretation only. Please see the procedural report for the amount of  contrast and the fluoroscopy time utilized. Electronically Signed   By: Jerilynn Mages.  Shick M.D.   On: 07/14/2018 16:04    Anti-infectives: Anti-infectives (From admission, onward)   Start     Dose/Rate Route Frequency Ordered Stop   07/13/18 0600  piperacillin-tazobactam (ZOSYN) IVPB 3.375 g     3.375 g 12.5 mL/hr over 240 Minutes Intravenous Every 8 hours 07/12/18 2127     07/12/18 2030  piperacillin-tazobactam (ZOSYN) IVPB 3.375 g     3.375 g 100 mL/hr over 30 Minutes Intravenous  Once 07/12/18 2017 07/12/18 2329      Assessment/Plan: Choledocholithiasis status post ERCP  Discussed laparoscopic cholecystectomy with the patient today.  The pros and cons of surgery especially with her advanced age were discussed today.  Overall, she is in relatively good health I think would benefit from laparoscopic cholecystectomy.The procedure has been discussed with the patient. Operative and non operative treatments have been discussed. Risks of surgery include bleeding, infection,  Common bile duct injury,  Injury to the stomach,liver, colon,small intestine, abdominal wall,  Diaphragm,  Major blood vessels,  And the need for an open procedure.  Other risks include worsening of medical problems, death,  DVT and pulmonary embolism, and cardiovascular events.   Medical options have also been discussed. The patient has been informed of long term expectations of surgery and non surgical options,  The patient agrees to proceed.     LOS: 3 days    Joyice Faster Dale Strausser 07/15/2018

## 2018-07-15 NOTE — Progress Notes (Signed)
Pharmacy Antibiotic Note  April Hodges is a 85 y.o. female admitted on 07/12/2018 with abdominal pain.  Pharmacy has been consulted for zosyn dosing. Cr 1.58 WBC wnl afebrile - CT/ERCP + gall stones   Plan: Zosyn 3.375g IV q8h (4 hour infusion).  Monitor for renal function and adjust if CrCl <20 Monitor for deescalation, LOT  Height: 4\' 11"  (149.9 cm) Weight: 157 lb 6.5 oz (71.4 kg) IBW/kg (Calculated) : 43.2  Temp (24hrs), Avg:98.2 F (36.8 C), Min:97.5 F (36.4 C), Max:98.8 F (37.1 C)  Recent Labs  Lab 07/12/18 1703 07/13/18 0350 07/13/18 1630 07/14/18 0335 07/15/18 0419  WBC 9.7 16.3*  --  8.9 7.9  CREATININE 1.22* 1.46* 1.58* 1.48* 1.58*    Estimated Creatinine Clearance: 20.4 mL/min (A) (by C-G formula based on SCr of 1.58 mg/dL (H)).    Allergies  Allergen Reactions  . Amlodipine Swelling    Coughing as well  . Bystolic [Nebivolol Hcl] Other (See Comments)    Bradycardia - HR in the 40s   Microbiology results: 5/2: Blood cxs:  E.coli S-Zosyn    Zae Kirtz A. Levada Dy, PharmD, [place] Please utilize Amion for appropriate phone number to reach the unit pharmacist ([place])   07/15/2018 8:51 AM

## 2018-07-15 NOTE — Anesthesia Postprocedure Evaluation (Signed)
Anesthesia Post Note  Patient: April Hodges  Procedure(s) Performed: ENDOSCOPIC RETROGRADE CHOLANGIOPANCREATOGRAPHY (ERCP) (N/A ) SPHINCTEROTOMY REMOVAL OF STONES SCLEROTHERAPY BILIARY STENT PLACEMENT     Patient location during evaluation: PACU Anesthesia Type: General Level of consciousness: awake and alert Pain management: pain level controlled Vital Signs Assessment: post-procedure vital signs reviewed and stable Respiratory status: spontaneous breathing, nonlabored ventilation, respiratory function stable and patient connected to nasal cannula oxygen Cardiovascular status: blood pressure returned to baseline and stable Postop Assessment: no apparent nausea or vomiting Anesthetic complications: no    Last Vitals:  Vitals:   07/15/18 0803 07/15/18 1601  BP: (!) 146/86 130/65  Pulse: (!) 103 (!) 44  Resp: 16 18  Temp: 36.5 C 36.6 C  SpO2: 94% 97%    Last Pain:  Vitals:   07/15/18 1601  TempSrc: Oral  PainSc:                  Effie Berkshire

## 2018-07-15 NOTE — Progress Notes (Signed)
PROGRESS NOTE    April Hodges  HUT:[id number] DOB: 12/10/1925 DOA: 07/12/2018 PCP: Lajean Manes, MD  Brief Narrative: April Hodges is a 85 y.o. female with medical history significant of hypertension, hyperlipidemia, CKD stage III, GERD, hypothyroidism, sinus bradycardia, presented with abdominal pain, nausea vomiting. -CT noted distended gallbladder, gallstones, started on Zosyn for suspected cholecystitis and admitted with surgical consult -Subsequently had HIDA scan which noted high-grade CBD obstruction, choledocholithiasis, gastroenterology consulted-underwent ERCP with stone retrieval and stenting 5/4, plan for lap chole 5/6   Assessment & Plan:  Choledocholithiasis, cholelithiasis and possible early cholangitis -CT and ultrasound on admission did not show CBD obstruction, however bilirubin and LFTs worsened, follow-up HIDA scan 5/3 was concerning for high-grade CBD obstruction likely stone, bilirubin trending up, LFTs elevated as well -Gastroenterology consulted -Blood cultures growing E. Coli, Sensitivities noted, continue Zosyn today -Underwent ERCP with stone retrieval and stenting 5/4, greatly appreciate gastroenterology assistance -General surgery following, plan for lap chole tomorrow -Post lap chole she could be transitioned to oral antibiotics for total of 7 to 10-day course -Clinically stable and improving, tolerating liquid diet -Labs in a.m.  Essential hypertension: -IV Hydralazine prn -Continue home medications: Lisinopril -Continue Cardizem   Hypothyroidism; -Continue Synthroid  CKD (chronic kidney disease), stage III ([place]): stable.  Baseline creatinine 1.0-1.3. -Stable  Chronic diastolic CHF (congestive heart failure) ([place]): 2D echo 11/28/2017 showed EF 60 to 65% with grade 1 diastolic dysfunction.  -No evidence of volume overload, not on diuretics at baseline  Lung nodule: Incidental findings on CTA She will follow-up with PCP   DVT prophylaxis:  Lovenox Code Status: Full code Family Communication: No family at bedside, called and updated with son April Hodges 5/4 Disposition Plan: Home pending above work-up  Consultants:   General surgery   Procedures: ERCP with stone removal and stenting 5/4-Dr. Therisa Doyne  Antimicrobials:    Subjective: -Feels well, denies any abdominal pain nausea vomiting, slight cough  Objective: Vitals:   07/14/18 1715 07/14/18 2050 07/15/18 0537 07/15/18 0803  BP: (!) 167/74 (!) 175/56 (!) 159/53 (!) 146/86  Pulse: 65 69 (!) 57 (!) 103  Resp: 16 18 18 16   Temp: (!) 97.5 F (36.4 C) 98.7 F (37.1 C) 98.5 F (36.9 C) 97.7 F (36.5 C)  TempSrc: Oral Oral Oral Oral  SpO2: 92% 94% 94% 94%  Weight:      Height:        Intake/Output Summary (Last 24 hours) at 07/15/2018 1259 Last data filed at 07/15/2018 1100 Gross per 24 hour  Intake 1173.33 ml  Output 705 ml  Net 468.33 ml   Filed Weights   07/13/18 0450 07/13/18 2239 07/14/18 1244  Weight: 71 kg 71.4 kg 71.4 kg    Examination:  Gen: Awake, Alert, Oriented X 3, no distress HEENT: PERRLA, Neck supple, no JVD Lungs: Few basilar crackles CVS: RRR,No Gallops,Rubs or new Murmurs Abd: soft, Non tender, non distended, BS present Extremities: No edema Skin: no new rashes Psychiatry: Judgement and insight appear normal. Mood & affect appropriate.     Data Reviewed:   CBC: Recent Labs  Lab 07/12/18 1703 07/13/18 0350 07/14/18 0335 07/15/18 0419  WBC 9.7 16.3* 8.9 7.9  NEUTROABS 7.9*  --   --   --   HGB 13.2 12.7 11.8* 12.1  HCT 41.0 38.8 35.9* 37.2  MCV 94.0 92.2 93.5 92.5  PLT 253 229 196 656   Basic Metabolic Panel: Recent Labs  Lab 07/12/18 1703 07/13/18 0350 07/13/18 1630 07/14/18 0335 07/15/18 0419  NA 137 136 139 140 138  K 4.9 5.0 4.4 4.4 4.1  CL 102 100 105 107 103  CO2 22 22 24 22 23   GLUCOSE 135* 136* 89 79 110*  BUN 26* 24* 26* 21 24*  CREATININE 1.22* 1.46* 1.58* 1.48* 1.58*  CALCIUM 9.6 9.0 9.0 8.8* 9.0    GFR: Estimated Creatinine Clearance: 20.4 mL/min (A) (by C-G formula based on SCr of 1.58 mg/dL (H)). Liver Function Tests: Recent Labs  Lab 07/12/18 1703 07/13/18 1630 07/14/18 0335 07/15/18 0419  AST 177* 301* 181* 100*  ALT 98* 350* 268* 190*  ALKPHOS 109 122 113 130*  BILITOT 1.7* 5.8* 6.3* 5.7*  PROT 7.3 6.6 6.4* 6.7  ALBUMIN 3.6 3.0* 2.8* 2.8*   Recent Labs  Lab 07/12/18 1703  LIPASE 33   No results for input(s): AMMONIA in the last 168 hours. Coagulation Profile: Recent Labs  Lab 07/12/18 2125  INR 1.0   Cardiac Enzymes: Recent Labs  Lab 07/12/18 1703  TROPONINI <0.03   BNP (last 3 results) No results for input(s): PROBNP in the last 8760 hours. HbA1C: No results for input(s): HGBA1C in the last 72 hours. CBG: No results for input(s): GLUCAP in the last 168 hours. Lipid Profile: No results for input(s): CHOL, HDL, LDLCALC, TRIG, CHOLHDL, LDLDIRECT in the last 72 hours. Thyroid Function Tests: No results for input(s): TSH, T4TOTAL, FREET4, T3FREE, THYROIDAB in the last 72 hours. Anemia Panel: No results for input(s): VITAMINB12, FOLATE, FERRITIN, TIBC, IRON, RETICCTPCT in the last 72 hours. Urine analysis:    Component Value Date/Time   COLORURINE YELLOW 07/12/2018 [place] 07/12/2018 1711   LABSPEC 1.016 07/12/2018 1711   PHURINE 7.0 07/12/2018 1711   GLUCOSEU NEGATIVE 07/12/2018 1711   HGBUR NEGATIVE 07/12/2018 1711   BILIRUBINUR NEGATIVE 07/12/2018 1711   KETONESUR NEGATIVE 07/12/2018 1711   PROTEINUR 30 (A) 07/12/2018 1711   NITRITE NEGATIVE 07/12/2018 1711   LEUKOCYTESUR NEGATIVE 07/12/2018 1711   Sepsis Labs: @LABRCNTIP (procalcitonin:4,lacticidven:4)  ) Recent Results (from the past 240 hour(s))  SARS Coronavirus 2 (CEPHEID - Performed in [place] hospital lab), Hosp Order     Status: None   Collection Time: 07/12/18  8:17 PM  Result Value Ref Range Status   SARS Coronavirus 2 NEGATIVE NEGATIVE Final    Comment:  (NOTE) If result is NEGATIVE SARS-CoV-2 target nucleic acids are NOT DETECTED. The SARS-CoV-2 RNA is generally detectable in upper and lower  respiratory specimens during the acute phase of infection. The lowest  concentration of SARS-CoV-2 viral copies this assay can detect is 250  copies / mL. A negative result does not preclude SARS-CoV-2 infection  and should not be used as the sole basis for treatment or other  patient management decisions.  A negative result may occur with  improper specimen collection / handling, submission of specimen other  than nasopharyngeal swab, presence of viral mutation(s) within the  areas targeted by this assay, and inadequate number of viral copies  (<250 copies / mL). A negative result must be combined with clinical  observations, patient history, and epidemiological information. If result is POSITIVE SARS-CoV-2 target nucleic acids are DETECTED. The SARS-CoV-2 RNA is generally detectable in upper and lower  respiratory specimens dur ing the acute phase of infection.  Positive  results are indicative of active infection with SARS-CoV-2.  Clinical  correlation with patient history and other diagnostic information is  necessary to determine patient infection status.  Positive results do  not rule out  bacterial infection or co-infection with other viruses. If result is PRESUMPTIVE POSTIVE SARS-CoV-2 nucleic acids MAY BE PRESENT.   A presumptive positive result was obtained on the submitted specimen  and confirmed on repeat testing.  While 2019 novel coronavirus  (SARS-CoV-2) nucleic acids may be present in the submitted sample  additional confirmatory testing may be necessary for epidemiological  and / or clinical management purposes  to differentiate between  SARS-CoV-2 and other Sarbecovirus currently known to infect humans.  If clinically indicated additional testing with an alternate test  methodology [id number]) is advised. The SARS-CoV-2 RNA is  generally  detectable in upper and lower respiratory sp ecimens during the acute  phase of infection. The expected result is Negative. Fact Sheet for Patients:  StrictlyIdeas.no Fact Sheet for Healthcare Providers: BankingDealers.co.za This test is not yet approved or cleared by the [place] FDA and has been authorized for detection and/or diagnosis of SARS-CoV-2 by FDA under an Emergency Use Authorization (EUA).  This EUA will remain in effect (meaning this test can be used) for the duration of the COVID-19 declaration under Section 564(b)(1) of the Act, 21 U.S.C. section 360bbb-3(b)(1), unless the authorization is terminated or revoked sooner. Performed at [facility], [place] [address], [place], [place] [address]   Culture, blood (Routine X 2) w Reflex to ID Panel     Status: Abnormal   Collection Time: 07/12/18  9:25 PM  Result Value Ref Range Status   Specimen Description BLOOD LEFT HAND  Final   Special Requests   Final    BOTTLES DRAWN AEROBIC AND ANAEROBIC Blood Culture results may not be optimal due to an inadequate volume of blood received in culture bottles   Culture  Setup Time ANAEROBIC BOTTLE ONLY GRAM NEGATIVE RODS   Final   Culture (A)  Final    ESCHERICHIA COLI SUSCEPTIBILITIES PERFORMED ON PREVIOUS CULTURE WITHIN THE LAST 5 DAYS. Performed at [facility], [place] [address] [place], [place] [address]    Report Status 07/15/2018 FINAL  Final  Culture, blood (Routine X 2) w Reflex to ID Panel     Status: Abnormal   Collection Time: 07/12/18 10:07 PM  Result Value Ref Range Status   Specimen Description BLOOD RIGHT ANTECUBITAL  Final   Special Requests   Final    BOTTLES DRAWN AEROBIC AND ANAEROBIC Blood Culture adequate volume Performed at [facility], [place] [address], [place], [place] [address]    Culture  Setup Time   Final    IN BOTH AEROBIC AND ANAEROBIC BOTTLES GRAM NEGATIVE RODS     Culture ESCHERICHIA COLI (A)  Final   Report Status 07/15/2018 FINAL  Final   Organism ID, Bacteria ESCHERICHIA COLI  Final      Susceptibility   Escherichia coli - MIC*    AMPICILLIN >=32 RESISTANT Resistant     CEFAZOLIN <=4 SENSITIVE Sensitive     CEFEPIME <=1 SENSITIVE Sensitive     CEFTAZIDIME <=1 SENSITIVE Sensitive     CEFTRIAXONE <=1 SENSITIVE Sensitive     CIPROFLOXACIN <=0.25 SENSITIVE Sensitive     GENTAMICIN <=1 SENSITIVE Sensitive     IMIPENEM <=0.25 SENSITIVE Sensitive     TRIMETH/SULFA >=320 RESISTANT Resistant     AMPICILLIN/SULBACTAM 8 SENSITIVE Sensitive     PIP/TAZO <=4 SENSITIVE Sensitive     Extended ESBL NEGATIVE Sensitive     * ESCHERICHIA COLI  Blood Culture ID Panel (Reflexed)     Status: Abnormal   Collection Time:  07/12/18 10:07 PM  Result Value Ref Range Status   Enterococcus species NOT DETECTED NOT DETECTED Final   Listeria monocytogenes NOT DETECTED NOT DETECTED Final   Staphylococcus species NOT DETECTED NOT DETECTED Final   Staphylococcus aureus (BCID) NOT DETECTED NOT DETECTED Final   Streptococcus species NOT DETECTED NOT DETECTED Final   Streptococcus agalactiae NOT DETECTED NOT DETECTED Final   Streptococcus pneumoniae NOT DETECTED NOT DETECTED Final   Streptococcus pyogenes NOT DETECTED NOT DETECTED Final   Acinetobacter baumannii NOT DETECTED NOT DETECTED Final   Enterobacteriaceae species DETECTED (A) NOT DETECTED Final    Comment: Enterobacteriaceae represent a large family of gram-negative bacteria, not a single organism. CRITICAL RESULT CALLED TO, READ BACK BY AND VERIFIED WITH: MIKE MACCIA @ [phone] ON 07/13/18 BY ROBINSON Z.     Enterobacter cloacae complex NOT DETECTED NOT DETECTED Final   Escherichia coli DETECTED (A) NOT DETECTED Final    Comment: CRITICAL RESULT CALLED TO, READ BACK BY AND VERIFIED WITH: MIKE MACCIA @ [phone] ON 07/13/18 BY ROBINSON Z.     Klebsiella oxytoca NOT DETECTED NOT DETECTED Final   Klebsiella pneumoniae NOT  DETECTED NOT DETECTED Final   Proteus species NOT DETECTED NOT DETECTED Final   Serratia marcescens NOT DETECTED NOT DETECTED Final   Carbapenem resistance NOT DETECTED NOT DETECTED Final   Haemophilus influenzae NOT DETECTED NOT DETECTED Final   Neisseria meningitidis NOT DETECTED NOT DETECTED Final   Pseudomonas aeruginosa NOT DETECTED NOT DETECTED Final   Candida albicans NOT DETECTED NOT DETECTED Final   Candida glabrata NOT DETECTED NOT DETECTED Final   Candida krusei NOT DETECTED NOT DETECTED Final   Candida parapsilosis NOT DETECTED NOT DETECTED Final   Candida tropicalis NOT DETECTED NOT DETECTED Final    Comment: Performed at [facility], [place] [address] [place] [address] [place], [place] [address]  MRSA PCR Screening     Status: None   Collection Time: 07/12/18 11:46 PM  Result Value Ref Range Status   MRSA by PCR NEGATIVE NEGATIVE Final    Comment:        The GeneXpert MRSA Assay (FDA approved for NASAL specimens only), is one component of a comprehensive MRSA colonization surveillance program. It is not intended to diagnose MRSA infection nor to guide or monitor treatment for MRSA infections. Performed at [facility], [place] [address] [place], [place] [address]          Radiology Studies: Nm Hepatobiliary Liver Func  Result Date: 07/13/2018 CLINICAL DATA:  Abdominal pain.  Epigastric pain. EXAM: NUCLEAR MEDICINE HEPATOBILIARY IMAGING TECHNIQUE: Sequential images of the abdomen were obtained out to 60 minutes following intravenous administration of radiopharmaceutical. RADIOPHARMACEUTICALS:  5.46 mCi Tc-23m  Choletec IV COMPARISON:  Right upper quadrant ultrasound Jul 12, 2018 FINDINGS: There is prompt uptake of radiotracer in the liver. No excretion is seen from the liver during the 4 hours of imaging. Very little background activity seen outside the liver. IMPRESSION: 1. The prompt uptake of radiotracer in the liver with no washout by 4 hours could be seen in the setting  of high-grade common bile duct obstruction or severe underlying liver dysfunction. Given a dilated common bile duct, 10.8 mm, seen on the recent right upper quadrant ultrasound as well as the rapid uptake of radiotracer in the liver and the lack of background activity, a high-grade common bile duct obstruction is favored. Review of the CT scan from yesterday also demonstrates common bile duct dilatation in retrospect. An underlying cause for the  dilatation is not identified on the CT scan. Recommend an MRCP or ERCP for better evaluation. These results will be called to the ordering clinician or representative by the Radiologist Assistant, and communication documented in the PACS or zVision Dashboard. Electronically Signed   By: Dorise Bullion III M.D   On: 07/13/2018 15:52   Dg Ercp Biliary & Pancreatic Ducts  Result Date: 07/14/2018 CLINICAL DATA:  Choledocholithiasis, biliary obstruction EXAM: ERCP sphincterotomy, balloon extraction, and endoscopic stent placement TECHNIQUE: Multiple spot images obtained with the fluoroscopic device and submitted for interpretation post-procedure. FLUOROSCOPY TIME:  Fluoroscopy Time:  1 minutes 49 seconds Radiation Exposure Index (if provided by the fluoroscopic device): Not available Number of Acquired Spot Images: 4 COMPARISON:  07/12/2018 FINDINGS: Limited ERCP imaging during the procedure. Diffuse biliary dilatation. Nonobstructing filling defects in the mid CBD. Balloon extraction performed for stone removal. Endoscopic temporary biliary stent in place in the distal CBD. Please refer to the ERCP notes for further details of the intervention. IMPRESSION: Choledocholithiasis and biliary dilatation. These images were submitted for radiologic interpretation only. Please see the procedural report for the amount of contrast and the fluoroscopy time utilized. Electronically Signed   By: Jerilynn Mages.  Shick M.D.   On: 07/14/2018 16:04        Scheduled Meds: . diltiazem  120 mg Oral  Daily  . enoxaparin (LOVENOX) injection  30 mg Subcutaneous Q24H  . levothyroxine  125 mcg Oral Q0600  . lisinopril  20 mg Oral Daily  . multivitamin with minerals  1 tablet Oral Daily  . pantoprazole (PROTONIX) IV  40 mg Intravenous Q12H   Continuous Infusions: . piperacillin-tazobactam (ZOSYN)  IV 3.375 g (07/15/18 0532)     LOS: 3 days    Time spent: 14min    Domenic Polite, MD [facility]   07/15/2018, 12:59 PM

## 2018-07-16 ENCOUNTER — Encounter (HOSPITAL_COMMUNITY): Payer: Self-pay

## 2018-07-16 ENCOUNTER — Inpatient Hospital Stay (HOSPITAL_COMMUNITY): Payer: PPO | Admitting: Certified Registered"

## 2018-07-16 ENCOUNTER — Encounter (HOSPITAL_COMMUNITY)
Admission: EM | Disposition: A | Discharge status: Home / Self Care | Payer: Self-pay | Source: Home / Self Care | Attending: Internal Medicine

## 2018-07-16 ENCOUNTER — Ambulatory Visit: Payer: Self-pay | Admitting: Surgery

## 2018-07-16 HISTORY — PX: CHOLECYSTECTOMY: SHX55

## 2018-07-16 LAB — COMPREHENSIVE METABOLIC PANEL
ALT: 147 U/L — ABNORMAL HIGH (ref 0–44)
AST: 68 U/L — ABNORMAL HIGH (ref 15–41)
Albumin: 2.7 g/dL — ABNORMAL LOW (ref 3.5–5.0)
Alkaline Phosphatase: 123 U/L (ref 38–126)
Anion gap: 14 (ref 5–15)
BUN: 18 mg/dL (ref 8–23)
CO2: 27 mmol/L (ref 22–32)
Calcium: 9.1 mg/dL (ref 8.9–10.3)
Chloride: 98 mmol/L (ref 98–111)
Creatinine, Ser: 1.39 mg/dL — ABNORMAL HIGH (ref 0.44–1.00)
GFR calc Af Amer: 39 mL/min — ABNORMAL LOW (ref >60)
GFR calc non Af Amer: 33 mL/min — ABNORMAL LOW (ref >60)
Glucose, Bld: 106 mg/dL — ABNORMAL HIGH (ref 70–99)
Potassium: 3.6 mmol/L (ref 3.5–5.1)
Sodium: 139 mmol/L (ref 135–145)
Total Bilirubin: 3.6 mg/dL — ABNORMAL HIGH (ref 0.3–1.2)
Total Protein: 6.5 g/dL (ref 6.5–8.1)

## 2018-07-16 LAB — CBC
HCT: 35.3 % — ABNORMAL LOW (ref 36.0–46.0)
Hemoglobin: 11.8 g/dL — ABNORMAL LOW (ref 12.0–15.0)
MCH: 30.9 pg (ref 26.0–34.0)
MCHC: 33.4 g/dL (ref 30.0–36.0)
MCV: 92.4 fL (ref 80.0–100.0)
Platelets: 205 10*3/uL (ref 150–400)
RBC: 3.82 MIL/uL — ABNORMAL LOW (ref 3.87–5.11)
RDW: 13.2 % (ref 11.5–15.5)
WBC: 6.8 10*3/uL (ref 4.0–10.5)
nRBC: 0 % (ref 0.0–0.2)

## 2018-07-16 LAB — SURGICAL PCR SCREEN
MRSA, PCR: NEGATIVE
Staphylococcus aureus: NEGATIVE

## 2018-07-16 SURGERY — LAPAROSCOPIC CHOLECYSTECTOMY
Anesthesia: General

## 2018-07-16 MED ORDER — FENTANYL CITRATE (PF) 100 MCG/2ML IJ SOLN: 25.0000 ug | INTRAMUSCULAR | Status: DC | PRN

## 2018-07-16 MED ORDER — 0.9 % SODIUM CHLORIDE (POUR BTL) OPTIME
TOPICAL | Status: DC | PRN
  Administered 2018-07-16: 1000 mL

## 2018-07-16 MED ORDER — SODIUM CHLORIDE 0.9 % IR SOLN
Status: DC | PRN
  Administered 2018-07-16: 1000 mL

## 2018-07-16 MED ORDER — SUGAMMADEX SODIUM 200 MG/2ML IV SOLN
INTRAVENOUS | Status: DC | PRN
  Administered 2018-07-16: 200 mg via INTRAVENOUS

## 2018-07-16 MED ORDER — LACTATED RINGERS IV SOLN
INTRAVENOUS | Status: DC | PRN
  Administered 2018-07-16: 12:00:00 via INTRAVENOUS

## 2018-07-16 MED ORDER — GLYCOPYRROLATE PF 0.2 MG/ML IJ SOSY
PREFILLED_SYRINGE | INTRAMUSCULAR | Status: DC | PRN
  Administered 2018-07-16: .2 mg via INTRAVENOUS

## 2018-07-16 MED ORDER — OXYCODONE-ACETAMINOPHEN 5-325 MG PO TABS: 1.0000 | ORAL_TABLET | ORAL | Status: DC | PRN

## 2018-07-16 MED ORDER — ONDANSETRON HCL 4 MG/2ML IJ SOLN: 4.0000 mg | Freq: Four times a day (QID) | INTRAMUSCULAR | Status: DC | PRN

## 2018-07-16 MED ORDER — EPHEDRINE 5 MG/ML INJ
INTRAVENOUS | Status: AC
  Filled 2018-07-16: qty 10

## 2018-07-16 MED ORDER — ONDANSETRON HCL 4 MG/2ML IJ SOLN
INTRAMUSCULAR | Status: DC | PRN
  Administered 2018-07-16: 4 mg via INTRAVENOUS

## 2018-07-16 MED ORDER — PHENYLEPHRINE 40 MCG/ML (10ML) SYRINGE FOR IV PUSH (FOR BLOOD PRESSURE SUPPORT)
PREFILLED_SYRINGE | INTRAVENOUS | Status: AC
  Filled 2018-07-16: qty 10

## 2018-07-16 MED ORDER — HYDRALAZINE HCL 20 MG/ML IJ SOLN
INTRAMUSCULAR | Status: AC
  Administered 2018-07-16: 11:00:00 10 mg via INTRAVENOUS
  Filled 2018-07-16: qty 1

## 2018-07-16 MED ORDER — PROPOFOL 10 MG/ML IV BOLUS
INTRAVENOUS | Status: AC
  Filled 2018-07-16: qty 20

## 2018-07-16 MED ORDER — FENTANYL CITRATE (PF) 250 MCG/5ML IJ SOLN
INTRAMUSCULAR | Status: DC | PRN
  Administered 2018-07-16: 100 ug via INTRAVENOUS
  Administered 2018-07-16: 50 ug via INTRAVENOUS

## 2018-07-16 MED ORDER — ROCURONIUM BROMIDE 10 MG/ML (PF) SYRINGE
PREFILLED_SYRINGE | INTRAVENOUS | Status: AC
  Filled 2018-07-16: qty 10

## 2018-07-16 MED ORDER — PHENYLEPHRINE 40 MCG/ML (10ML) SYRINGE FOR IV PUSH (FOR BLOOD PRESSURE SUPPORT)
PREFILLED_SYRINGE | INTRAVENOUS | Status: DC | PRN
  Administered 2018-07-16: 40 ug via INTRAVENOUS

## 2018-07-16 MED ORDER — LIDOCAINE 2% (20 MG/ML) 5 ML SYRINGE
INTRAMUSCULAR | Status: AC
  Filled 2018-07-16: qty 5

## 2018-07-16 MED ORDER — BUPIVACAINE-EPINEPHRINE (PF) 0.25% -1:200000 IJ SOLN
INTRAMUSCULAR | Status: AC
  Filled 2018-07-16: qty 30

## 2018-07-16 MED ORDER — BUPIVACAINE-EPINEPHRINE 0.25% -1:200000 IJ SOLN
INTRAMUSCULAR | Status: DC | PRN
  Administered 2018-07-16: 13 mL

## 2018-07-16 MED ORDER — DEXAMETHASONE SODIUM PHOSPHATE 10 MG/ML IJ SOLN
INTRAMUSCULAR | Status: DC | PRN
  Administered 2018-07-16: 10 mg via INTRAVENOUS

## 2018-07-16 MED ORDER — SUCCINYLCHOLINE CHLORIDE 200 MG/10ML IV SOSY
PREFILLED_SYRINGE | INTRAVENOUS | Status: AC
  Filled 2018-07-16: qty 10

## 2018-07-16 MED ORDER — EPHEDRINE SULFATE-NACL 50-0.9 MG/10ML-% IV SOSY
PREFILLED_SYRINGE | INTRAVENOUS | Status: DC | PRN
  Administered 2018-07-16: 10 mg via INTRAVENOUS

## 2018-07-16 MED ORDER — FENTANYL CITRATE (PF) 250 MCG/5ML IJ SOLN
INTRAMUSCULAR | Status: AC
  Filled 2018-07-16: qty 5

## 2018-07-16 MED ORDER — GLYCOPYRROLATE PF 0.2 MG/ML IJ SOSY
PREFILLED_SYRINGE | INTRAMUSCULAR | Status: AC
  Filled 2018-07-16: qty 1

## 2018-07-16 MED ORDER — OXYCODONE HCL 5 MG PO TABS: 5.0000 mg | ORAL_TABLET | Freq: Once | ORAL | Status: DC | PRN

## 2018-07-16 MED ORDER — OXYCODONE HCL 5 MG/5ML PO SOLN: 5.0000 mg | Freq: Once | ORAL | Status: DC | PRN

## 2018-07-16 MED ORDER — SUCCINYLCHOLINE CHLORIDE 200 MG/10ML IV SOSY
PREFILLED_SYRINGE | INTRAVENOUS | Status: DC | PRN
  Administered 2018-07-16: 100 mg via INTRAVENOUS

## 2018-07-16 MED ORDER — HYDRALAZINE HCL 20 MG/ML IJ SOLN
10.0000 mg | Freq: Once | INTRAMUSCULAR | Status: AC
  Administered 2018-07-16: 10 mg via INTRAVENOUS

## 2018-07-16 MED ORDER — ONDANSETRON HCL 4 MG/2ML IJ SOLN
INTRAMUSCULAR | Status: AC
  Filled 2018-07-16: qty 2

## 2018-07-16 MED ORDER — SODIUM CHLORIDE 0.9 % IV SOLN
INTRAVENOUS | Status: DC | PRN
  Administered 2018-07-16: 1000 mL via INTRAVENOUS

## 2018-07-16 MED ORDER — PROPOFOL 10 MG/ML IV BOLUS
INTRAVENOUS | Status: DC | PRN
  Administered 2018-07-16: 20 mg via INTRAVENOUS
  Administered 2018-07-16: 100 mg via INTRAVENOUS

## 2018-07-16 MED ORDER — LACTATED RINGERS IV SOLN
INTRAVENOUS | Status: DC
  Administered 2018-07-16: 10:00:00 via INTRAVENOUS

## 2018-07-16 MED ORDER — ROCURONIUM BROMIDE 10 MG/ML (PF) SYRINGE
PREFILLED_SYRINGE | INTRAVENOUS | Status: DC | PRN
  Administered 2018-07-16: 50 mg via INTRAVENOUS

## 2018-07-16 MED ORDER — LIDOCAINE 2% (20 MG/ML) 5 ML SYRINGE
INTRAMUSCULAR | Status: DC | PRN
  Administered 2018-07-16: 50 mg via INTRAVENOUS

## 2018-07-16 MED ORDER — DEXAMETHASONE SODIUM PHOSPHATE 10 MG/ML IJ SOLN
INTRAMUSCULAR | Status: AC
  Filled 2018-07-16: qty 1

## 2018-07-16 SURGICAL SUPPLY — 45 items
ADH SKN CLS APL DERMABOND .7 (GAUZE/BANDAGES/DRESSINGS) ×1
APPLIER CLIP ROT 10 11.4 M/L (STAPLE) ×3
APR CLP MED LRG 11.4X10 (STAPLE) ×1
BAG SPEC RTRVL 10 TROC 200 (ENDOMECHANICALS) ×1
BLADE CLIPPER SURG (BLADE) IMPLANT
CANISTER SUCT 3000ML PPV (MISCELLANEOUS) ×3 IMPLANT
CHLORAPREP W/TINT 26ML (MISCELLANEOUS) ×3 IMPLANT
CLIP APPLIE ROT 10 11.4 M/L (STAPLE) ×1 IMPLANT
COVER MAYO STAND STRL (DRAPES) ×3 IMPLANT
COVER SURGICAL LIGHT HANDLE (MISCELLANEOUS) ×3 IMPLANT
COVER WAND RF STERILE (DRAPES) ×3 IMPLANT
DERMABOND ADVANCED (GAUZE/BANDAGES/DRESSINGS) ×2
DERMABOND ADVANCED .7 DNX12 (GAUZE/BANDAGES/DRESSINGS) ×1 IMPLANT
DRAPE C-ARM 42X72 X-RAY (DRAPES) ×3 IMPLANT
DRAPE WARM FLUID 44X44 (DRAPE) ×3 IMPLANT
ELECT REM PT RETURN 9FT ADLT (ELECTROSURGICAL) ×3
ELECTRODE REM PT RTRN 9FT ADLT (ELECTROSURGICAL) ×1 IMPLANT
GLOVE BIO SURGEON STRL SZ8 (GLOVE) ×3 IMPLANT
GLOVE BIOGEL PI IND STRL 8 (GLOVE) ×1 IMPLANT
GLOVE BIOGEL PI INDICATOR 8 (GLOVE) ×2
GOWN STRL REUS W/ TWL LRG LVL3 (GOWN DISPOSABLE) ×2 IMPLANT
GOWN STRL REUS W/ TWL XL LVL3 (GOWN DISPOSABLE) ×1 IMPLANT
GOWN STRL REUS W/TWL LRG LVL3 (GOWN DISPOSABLE) ×6
GOWN STRL REUS W/TWL XL LVL3 (GOWN DISPOSABLE) ×3
KIT BASIN OR (CUSTOM PROCEDURE TRAY) ×3 IMPLANT
KIT TURNOVER KIT B (KITS) ×3 IMPLANT
NS IRRIG 1000ML POUR BTL (IV SOLUTION) ×3 IMPLANT
PAD ARMBOARD 7.5X6 YLW CONV (MISCELLANEOUS) ×3 IMPLANT
POUCH RETRIEVAL ECOSAC 10 (ENDOMECHANICALS) ×1 IMPLANT
POUCH RETRIEVAL ECOSAC 10MM (ENDOMECHANICALS) ×2
SCISSORS LAP 5X35 DISP (ENDOMECHANICALS) ×3 IMPLANT
SET CHOLANGIOGRAPH 5 50 .035 (SET/KITS/TRAYS/PACK) ×3 IMPLANT
SET IRRIG TUBING LAPAROSCOPIC (IRRIGATION / IRRIGATOR) ×3 IMPLANT
SET TUBE SMOKE EVAC HIGH FLOW (TUBING) ×3 IMPLANT
SLEEVE ENDOPATH XCEL 5M (ENDOMECHANICALS) ×3 IMPLANT
SPECIMEN JAR SMALL (MISCELLANEOUS) ×3 IMPLANT
SUT MNCRL AB 4-0 PS2 18 (SUTURE) ×3 IMPLANT
SUT VICRYL 0 UR6 27IN ABS (SUTURE) ×4 IMPLANT
TOWEL OR 17X24 6PK STRL BLUE (TOWEL DISPOSABLE) ×3 IMPLANT
TOWEL OR 17X26 10 PK STRL BLUE (TOWEL DISPOSABLE) ×3 IMPLANT
TRAY LAPAROSCOPIC MC (CUSTOM PROCEDURE TRAY) ×3 IMPLANT
TROCAR XCEL BLUNT TIP 100MML (ENDOMECHANICALS) ×3 IMPLANT
TROCAR XCEL NON-BLD 11X100MML (ENDOMECHANICALS) ×3 IMPLANT
TROCAR XCEL NON-BLD 5MMX100MML (ENDOMECHANICALS) ×3 IMPLANT
WATER STERILE IRR 1000ML POUR (IV SOLUTION) ×3 IMPLANT

## 2018-07-16 NOTE — Op Note (Signed)
Laparoscopic Cholecystectomy Procedure Note  Indications: This patient presents with symptomatic gallbladder disease and will undergo laparoscopic cholecystectomy.  Pre-operative Diagnosis: Calculus of gallbladder and bile duct with acute cholecystitis, without mention of obstruction  Post-operative Diagnosis: Same  Surgeon: Joyice Faster Nykayla Marcelli   Assistants: Dr Barry Dienes MD    Anesthesia: General endotracheal anesthesia and Local anesthesia 0.25.% bupivacaine, with epinephrine  ASA Class: 3  Procedure Details  The patient was seen again in the Holding Room. The risks, benefits, complications, treatment options, and expected outcomes were discussed with the patient. The possibilities of reaction to medication, pulmonary aspiration, perforation of viscus, bleeding, recurrent infection, finding a normal gallbladder, the need for additional procedures, failure to diagnose a condition, the possible need to convert to an open procedure, and creating a complication requiring transfusion or operation were discussed with the patient. The patient and/or family concurred with the proposed plan, giving informed consent. The site of surgery properly noted/marked. The patient was taken to Operating Room, identified as April Hodges and the procedure verified as Laparoscopic Cholecystectomy with Intraoperative Cholangiograms. A Time Out was held and the above information confirmed.  Prior to the induction of general anesthesia, antibiotic prophylaxis was administered. General endotracheal anesthesia was then administered and tolerated well. After the induction, the abdomen was prepped in the usual sterile fashion. The patient was positioned in the supine position with the left arm comfortably tucked, along with some reverse Trendelenburg.  Local anesthetic agent was injected into the skin near the umbilicus and an incision made. The midline fascia was incised and the Hasson technique was used to introduce a 12 mm port  under direct vision. It was secured with a figure of eight Vicryl suture placed in the usual fashion. Pneumoperitoneum was then created with CO2 and tolerated well without any adverse changes in the patient's vital signs. Additional trocars were introduced under direct vision with an 11 mm trocar in the epigastrium and TWO  5 mm trocars in the right upper quadrant. All skin incisions were infiltrated with a local anesthetic agent before making the incision and placing the trocars.   The gallbladder was identified, the fundus grasped and retracted cephalad. Adhesions were lysed bluntly and with the electrocautery where indicated, taking care not to injure any adjacent organs or viscus. The infundibulum was grasped and retracted laterally, exposing the peritoneum overlying the triangle of Calot. This was then divided and exposed in a blunt fashion. The cystic duct was clearly identified and bluntly dissected circumferentially. The junctions of the gallbladder, cystic duct and common bile duct were clearly identified prior to the division of any linear structure.   The critical view was obtained and the cystic duct was quite small.  No cholangiogram was performed.   The cystic duct was then  ligated with surgical clips  on the patient side and  clipped on the gallbladder side and divided. The cystic artery was identified, dissected free, ligated with clips and divided as well. Posterior cystic artery clipped and divided.  The gallbladder was dissected from the liver bed in retrograde fashion with the electrocautery. The gallbladder was removed. The liver bed was irrigated and inspected. Hemostasis was achieved with the electrocautery. Copious irrigation was utilized and was repeatedly aspirated until clear all particulate matter. Hemostasis was achieved with no signs  Of bleeding or bile leakage.  Pneumoperitoneum was completely reduced after viewing removal of the trocars under direct vision. The wound was  thoroughly irrigated and the fascia was then closed with a figure of  eight suture; the skin was then closed with 4 o monocryl  and a sterile dressing of dermabond  was applied.  Instrument, sponge, and needle counts were correct at closure and at the conclusion of the case.   Findings: Cholecystitis with Cholelithiasis  Estimated Blood Loss: Minimal         Drains: none          Total IV Fluids: per record          Specimens: Gallbladder           Complications: None; patient tolerated the procedure well.         Disposition: PACU - hemodynamically stable.         Condition: stable

## 2018-07-16 NOTE — Transfer of Care (Signed)
Immediate Anesthesia Transfer of Care Note  Patient: April Hodges  Procedure(s) Performed: LAPAROSCOPIC CHOLECYSTECTOMY (N/A )  Patient Location: PACU  Anesthesia Type:General  Level of Consciousness: awake, alert  and oriented  Airway & Oxygen Therapy: Patient Spontanous Breathing and Patient connected to face mask oxygen  Post-op Assessment: Report given to RN and Post -op Vital signs reviewed and stable  Post vital signs: Reviewed and stable  Last Vitals:  Vitals Value Taken Time  BP 163/85 07/16/2018 12:48 PM  Temp    Pulse 95 07/16/2018 12:49 PM  Resp 22 07/16/2018 12:49 PM  SpO2 95 % 07/16/2018 12:49 PM  Vitals shown include unvalidated device data.  Last Pain:  Vitals:   07/16/18 1019  TempSrc:   PainSc: 0-No pain      Patients Stated Pain Goal: 2 (68/93/40 6840)  Complications: No apparent anesthesia complications

## 2018-07-16 NOTE — Interval H&P Note (Signed)
History and Physical Interval Note:  07/16/2018 11:07 AM  April Hodges  has presented today for surgery, with the diagnosis of CHOLEDOCHOLITHIASIS.  The various methods of treatment have been discussed with the patient and family. After consideration of risks, benefits and other options for treatment, the patient has consented to  Procedure(s): LAPAROSCOPIC CHOLECYSTECTOMY WITH POSSIBLE INTRAOPERATIVE CHOLANGIOGRAM (N/A) as a surgical intervention.  The patient's history has been reviewed, patient examined, no change in status, stable for surgery.  I have reviewed the patient's chart and labs.  Questions were answered to the patient's satisfaction.   The procedure has been discussed with the patient. Operative and non operative treatments have been discussed. Risks of surgery include bleeding, infection,  Common bile duct injury,  Injury to the stomach,liver, colon,small intestine, abdominal wall,  Diaphragm,  Major blood vessels,  And the need for an open procedure.  Other risks include worsening of medical problems, death,  DVT and pulmonary embolism, and cardiovascular events.   Medical options have also been discussed. The patient has been informed of long term expectations of surgery and non surgical options,  The patient agrees to proceed.     [place]

## 2018-07-16 NOTE — Anesthesia Preprocedure Evaluation (Signed)
Anesthesia Evaluation  Patient identified by MRN, date of birth, ID band Patient awake    Reviewed: Allergy & Precautions, H&P , NPO status , Patient's Chart, lab work & pertinent test results  History of Anesthesia Complications (+) PONV and history of anesthetic complications  Airway Mallampati: II   Neck ROM: full    Dental   Pulmonary neg pulmonary ROS,    breath sounds clear to auscultation       Cardiovascular hypertension, +CHF   Rhythm:regular Rate:Normal     Neuro/Psych  Headaches,    GI/Hepatic GERD  ,choledocholithiasis   Endo/Other  Hypothyroidism obese  Renal/GU Renal InsufficiencyRenal disease     Musculoskeletal  (+) Arthritis ,   Abdominal   Peds  Hematology   Anesthesia Other Findings   Reproductive/Obstetrics                             Anesthesia Physical Anesthesia Plan  ASA: III  Anesthesia Plan: General   Post-op Pain Management:    Induction: Intravenous  PONV Risk Score and Plan: 4 or greater and Ondansetron, Dexamethasone and Treatment may vary due to age or medical condition  Airway Management Planned: Oral ETT  Additional Equipment:   Intra-op Plan:   Post-operative Plan: Extubation in OR  Informed Consent: I have reviewed the patients History and Physical, chart, labs and discussed the procedure including the risks, benefits and alternatives for the proposed anesthesia with the patient or authorized representative who has indicated his/her understanding and acceptance.       Plan Discussed with: CRNA, Anesthesiologist and Surgeon  Anesthesia Plan Comments:         Anesthesia Quick Evaluation

## 2018-07-16 NOTE — Progress Notes (Signed)
Pt BP 210/53(109), HR in the 50's. Pt A &Ox 4.Pt has a h/o sinus bradycardia. Dr Marcie Bal notified.Verbal orders to give 10mg  Hydralazine given. Will continue to monitor.

## 2018-07-16 NOTE — Anesthesia Procedure Notes (Signed)
Procedure Name: Intubation Date/Time: 07/16/2018 11:56 AM Performed by: Teressa Lower., CRNA Pre-anesthesia Checklist: Patient identified, Emergency Drugs available, Suction available and Patient being monitored Patient Re-evaluated:Patient Re-evaluated prior to induction Oxygen Delivery Method: Circle system utilized Preoxygenation: Pre-oxygenation with 100% oxygen Induction Type: IV induction and Cricoid Pressure applied Laryngoscope Size: Mac and 3 Grade View: Grade II Tube type: Oral Tube size: 7.0 mm Number of attempts: 1 Airway Equipment and Method: Stylet Placement Confirmation: ETT inserted through vocal cords under direct vision,  positive ETCO2 and breath sounds checked- equal and bilateral Secured at: 21 cm Tube secured with: Tape Dental Injury: Teeth and Oropharynx as per pre-operative assessment

## 2018-07-16 NOTE — Discharge Instructions (Signed)
[facility]  LAPAROSCOPIC SURGERY: POST OP INSTRUCTIONS Always review your discharge instruction sheet given to you by the facility where your surgery was performed. IF YOU HAVE DISABILITY OR FAMILY LEAVE FORMS, YOU MUST BRING THEM TO THE OFFICE FOR PROCESSING.   DO NOT GIVE THEM TO YOUR DOCTOR.  PAIN CONTROL  1. First take acetaminophen (Tylenol) AND/or ibuprofen (Advil) to control your pain after surgery.  Follow directions on package.  Taking acetaminophen (Tylenol) and/or ibuprofen (Advil) regularly after surgery will help to control your pain and lower the amount of prescription pain medication you may need.  You should not take more than 4,000 mg (4 grams) of acetaminophen (Tylenol) in 24 hours.  You should not take ibuprofen (Advil), aleve, motrin, naprosyn or other NSAIDS if you have a history of stomach ulcers or chronic kidney disease.  2. A prescription for pain medication may be given to you upon discharge.  Take your pain medication as prescribed, if you still have uncontrolled pain after taking acetaminophen (Tylenol) or ibuprofen (Advil). 3. Use ice packs to help control pain. 4. If you need a refill on your pain medication, please contact your pharmacy.  They will contact our office to request authorization. Prescriptions will not be filled after 5pm or on week-ends.  HOME MEDICATIONS 5. Take your usually prescribed medications unless otherwise directed.  DIET 6. You should follow a light diet the first few days after arrival home.  Be sure to include lots of fluids daily. Avoid fatty, fried foods.   CONSTIPATION 7. It is common to experience some constipation after surgery and if you are taking pain medication.  Increasing fluid intake and taking a stool softener (such as Colace) will usually help or prevent this problem from occurring.  A mild laxative (Milk of Magnesia or Miralax) should be taken according to package instructions if there are no bowel  movements after 48 hours.  WOUND/INCISION CARE 8. Most patients will experience some swelling and bruising in the area of the incisions.  Ice packs will help.  Swelling and bruising can take several days to resolve.  9. Unless discharge instructions indicate otherwise, follow guidelines below  a. STERI-STRIPS - you may remove your outer bandages 48 hours after surgery, and you may shower at that time.  You have steri-strips (small skin tapes) in place directly over the incision.  These strips should be left on the skin for 7-10 days.   b. DERMABOND/SKIN GLUE - you may shower in 24 hours.  The glue will flake off over the next 2-3 weeks. 10. Any sutures or staples will be removed at the office during your follow-up visit.  ACTIVITIES 11. You may resume regular (light) daily activities beginning the next day--such as daily self-care, walking, climbing stairs--gradually increasing activities as tolerated.  You may have sexual intercourse when it is comfortable.  Refrain from any heavy lifting or straining until approved by your doctor. a. You may drive when you are no longer taking prescription pain medication, you can comfortably wear a seatbelt, and you can safely maneuver your car and apply brakes.  FOLLOW-UP 12. You should see your doctor in the office for a follow-up appointment approximately 2-3 weeks after your surgery.  You should have been given your post-op/follow-up appointment when your surgery was scheduled.  If you did not receive a post-op/follow-up appointment, make sure that you call for this appointment within a day or two after you arrive home to insure a convenient appointment time.  OTHER INSTRUCTIONS  WHEN TO CALL YOUR DOCTOR: 1. Fever over 101.0 2. Inability to urinate 3. Continued bleeding from incision. 4. Increased pain, redness, or drainage from the incision. 5. Increasing abdominal pain  The clinic staff is available to answer your questions during regular business  hours.  Please dont hesitate to call and ask to speak to one of the nurses for clinical concerns.  If you have a medical emergency, go to the nearest emergency room or call 911.  A surgeon from [facility] is always on call at the hospital. [address] [place], [place], [place]  [address] ? [address], [place], [place]   [address] [phone] ? [phone] ? FAX [phone]

## 2018-07-16 NOTE — TOC Initial Note (Addendum)
Transition of Care [facility]) - Initial/Assessment Note    Patient Details  Name: April Hodges MRN: [id number] Date of Birth: Sep 10, 1925  Transition of Care [facility]) CM/SW Contact:    Sable Feil, LCSW Phone Number: 07/16/2018, 10:58 AM  Clinical Narrative:  CSW talked with patient at the bedside on 07/15/18 regarding her discharge plan. Mrs. was sitting in a chair and was very pleasant, alert, oriented and agreeable to talking with CSW. Patient confirmed that she is from [place] has a single family home on the property. She has a strong support system that includes two sons that live in [place] and [place]. [place], [place] and a daughter who lives in [place]. [place] explained that her son is calling the home health company associated with [facility] to get things in place for her return home. Patient indicated that CSW advised that she anticipates being discharged on Friday and someone from the home health agency will stay with her Friday night and her daughter is coming on Saturday and will be with her for a week or so.  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 She also expressed that he misses her dog, who is with her son that lives in [place]. She does not anticipate any needs from this CSW or the nurse case manager.                   Patient Goals and CMS Choice - April Hodges's goals is to have the gallbladder surgery and then discharge home. Patient expressed that she feels she will be fine at home alone, but her children will not allow her to do so.        Expected Discharge Plan and Services  Patient's plan is to discharge home and have someone with her the first night and her daughter with her for a week or so after that.                                              Prior Living Arrangements/Services  Patient lives at [facility] in a single family home and was independent prior to discharge, with no HH  services.                      Activities of Daily Living Home Assistive Devices/Equipment: None ADL Screening (condition at time of admission) Patient's cognitive ability adequate to safely complete daily activities?: Yes Is the patient deaf or have difficulty hearing?: No Does the patient have difficulty seeing, even when wearing glasses/contacts?: No Does the patient have difficulty concentrating, remembering, or making decisions?: No Patient able to express need for assistance with ADLs?: Yes Does the patient have difficulty dressing or bathing?: No Independently performs ADLs?: Yes (appropriate for developmental age) Does the patient have difficulty walking or climbing stairs?: Yes Weakness of Legs: None Weakness of Arms/Hands: None  Permission Sought/Granted                  Emotional Assessment  Patient alert, oriented, pleasant and verbalized her understanding of why she is in the hospital and her children's desire to assist her at discharge.             Admission diagnosis:  Biliary colic [H67.59] Patient Active Problem List   Diagnosis Date Noted  . Biliary colic 16/38/4665  .  Cholelithiasis 07/12/2018  . HLD (hyperlipidemia) 07/12/2018  . Chronic diastolic CHF (congestive heart failure) ([place]) 07/12/2018  . Lung nodule 07/12/2018  . Hypothyroidism   . Sinus bradycardia   . CKD (chronic kidney disease), stage III ([place])   . Demand ischemia ([place]) 06/03/2015  . Acute on chronic renal failure ([place]) 06/03/2015  . Essential hypertension 06/03/2015  . UTI (urinary tract infection) 06/03/2015  . Back pain 06/10/2014   PCP:  Lajean Manes, MD Pharmacy:   [facility] - [place], [place] - [address] [place] [address] Phone: [phone] Fax: [phone]     Social Determinants of Health ([place]) Interventions Social Connections: Patient has a good family support system, and also talked about her  friends at [place] and what they have done together (prior to COVID restrictions). Physical activity: Patient enjoys gardening.     Readmission Risk Interventions No flowsheet data found.

## 2018-07-16 NOTE — Progress Notes (Signed)
PROGRESS NOTE  April Hodges HAL:[id number] DOB: [birth year] DOA: 07/12/2018 PCP: Lajean Manes, MD   LOS: 4 days   Brief narrative: April Hodges a 85 y.o.femalewith medical history significant ofhypertension, hyperlipidemia, CKD stage III, GERD, hypothyroidism, sinus bradycardia,presented with abdominal pain, nausea vomiting. CT noted distended gallbladder, gallstones.  Patient was then admitted to hospital for choledocholithiasis with possible early cholangitis.  HIDA scan was performed which showed high-grade CBD obstruction and GI was consulted.  Patient underwent ERCP on 07/14/2018.  GI recommended surgical intervention.  Patient is awaiting for laparoscopic cholecystectomy today.  Subjective: Patient denies any abdominal pain, nausea or vomiting but feels little congested.  Awaiting for laparoscopic cholecystectomy.  Assessment/Plan:  Principal Problem:   Biliary colic Active Problems:   Essential hypertension   Hypothyroidism   CKD (chronic kidney disease), stage III (HCC)   Cholelithiasis   Chronic diastolic CHF (congestive heart failure) (HCC)   Lung nodule  Choledocholithiasis, cholelithiasis and possible early cholangitis CT scan and ultrasound did not show CBD obstruction but HIDA scan showed a high-grade CBD obstruction.  CT scan of the abdomen showed gallstones/sludge.  Patient was seen by GI and underwent ERCP with sphincterectomy and stent placement 07/14/2018.  Bilirubin trended down.  GI recommended surgical evaluation for cholecystectomy.  Awaiting for laparoscopic cholecystectomy today.  Patient was continued on Zosyn for early cholangitis.  Blood cultures grew E. coli.  Continue to monitor LFTs.  E. coli bacteremia.  Continue on Zosyn.  Follow sensitivity.  No fever today.  WBC was 6.8.  Essential hypertension: Blood pressure is still elevated.  Continue lisinopril, Cardizem...  IV antihypertensives as needed.  Hypothyroidism; -Continue Synthroid  CKD (chronic  kidney disease), stage III ([place]): stable.Baseline creatinine 1.0-1.3. -Stable.  Will closely monitor.  Chronic diastolic CHF (congestive heart failure) (HCC):2D echo 11/28/2017 showed EF 60 to 65% with grade 1 diastolic dysfunction.  Compensated at this time.  Not on diuretics at baseline.   Lung nodule:Incidental findingson CTA.  Will need to follow-up with her PCP on discharge   VTE Prophylaxis: Lovenox  Code Status: Full code  Family Communication: No one available at bedside  Disposition Plan: Likely in 1 to 2 days.  Awaiting for laparoscopic cholecystectomy   Consultants:  GI, general surgery  Procedures:  ERCP with stent placement on 07/14/2018.  Antibiotics: Anti-infectives (From admission, onward)   Start     Dose/Rate Route Frequency Ordered Stop   07/16/18 0600  ceFAZolin (ANCEF) IVPB 2g/100 mL premix     2 g 200 mL/hr over 30 Minutes Intravenous On call to O.R. 07/15/18 2016 07/16/18 1149   07/13/18 0600  [MAR Hold]  piperacillin-tazobactam (ZOSYN) IVPB 3.375 g     (MAR Hold since Wed 07/16/2018 at 0958. Reason: Transfer to a Procedural area.)   3.375 g 12.5 mL/hr over 240 Minutes Intravenous Every 8 hours 07/12/18 2127     07/12/18 2030  piperacillin-tazobactam (ZOSYN) IVPB 3.375 g     3.375 g 100 mL/hr over 30 Minutes Intravenous  Once 07/12/18 2017 07/12/18 2329      Objective: Vitals:   07/16/18 1035 07/16/18 1045  BP: (!) 211/60 (!) 214/56  Pulse:    Resp: (!) 25 (!) 25  Temp:    SpO2:      Intake/Output Summary (Last 24 hours) at 07/16/2018 1212 Last data filed at 07/16/2018 0700 Gross per 24 hour  Intake 363.41 ml  Output 400 ml  Net -36.59 ml   Filed Weights   07/14/18 1244 07/15/18 2038 07/16/18 1019  Weight: 71.4 kg 70.3 kg 70.3 kg   Body mass index is 31.3 kg/m.   Physical Exam:   GENERAL: Patient is alert awake and oriented. Not in obvious distress. HENT: No scleral pallor mildly icteric. Pupils equally reactive to light. Oral  mucosa is moist NECK: is supple, no palpable thyroid enlargement. CHEST: Clear to auscultation. No crackles or wheezes. Non tender on palpation. Diminished breath sounds bilaterally. CVS: S1 and S2 heard, no murmur. Regular rate and rhythm. No pericardial rub. ABDOMEN: Soft, non-tender, bowel sounds are present. No palpable hepato-splenomegaly. EXTREMITIES: No edema. CNS: Cranial nerves are intact. No focal motor or sensory deficits. SKIN: warm and dry without rashes.  Data Review: I have personally reviewed the following laboratory data and studies,  CBC: Recent Labs  Lab 07/12/18 1703 07/13/18 0350 07/14/18 0335 07/15/18 0419 07/16/18 0430  WBC 9.7 16.3* 8.9 7.9 6.8  NEUTROABS 7.9*  --   --   --   --   HGB 13.2 12.7 11.8* 12.1 11.8*  HCT 41.0 38.8 35.9* 37.2 35.3*  MCV 94.0 92.2 93.5 92.5 92.4  PLT 253 229 196 203 456   Basic Metabolic Panel: Recent Labs  Lab 07/13/18 0350 07/13/18 1630 07/14/18 0335 07/15/18 0419 07/16/18 0430  NA 136 139 140 138 139  K 5.0 4.4 4.4 4.1 3.6  CL 100 105 107 103 98  CO2 22 24 22 23 27   GLUCOSE 136* 89 79 110* 106*  BUN 24* 26* 21 24* 18  CREATININE 1.46* 1.58* 1.48* 1.58* 1.39*  CALCIUM 9.0 9.0 8.8* 9.0 9.1   Liver Function Tests: Recent Labs  Lab 07/12/18 1703 07/13/18 1630 07/14/18 0335 07/15/18 0419 07/16/18 0430  AST 177* 301* 181* 100* 68*  ALT 98* 350* 268* 190* 147*  ALKPHOS 109 122 113 130* 123  BILITOT 1.7* 5.8* 6.3* 5.7* 3.6*  PROT 7.3 6.6 6.4* 6.7 6.5  ALBUMIN 3.6 3.0* 2.8* 2.8* 2.7*   Recent Labs  Lab 07/12/18 1703  LIPASE 33   No results for input(s): AMMONIA in the last 168 hours. Cardiac Enzymes: Recent Labs  Lab 07/12/18 1703  TROPONINI <0.03   BNP (last 3 results) Recent Labs    07/12/18 2125  BNP 213.1*    ProBNP (last 3 results) No results for input(s): PROBNP in the last 8760 hours.  CBG: No results for input(s): GLUCAP in the last 168 hours. Recent Results (from the past 240  hour(s))  SARS Coronavirus 2 (CEPHEID - Performed in [place] hospital lab), Hosp Order     Status: None   Collection Time: 07/12/18  8:17 PM  Result Value Ref Range Status   SARS Coronavirus 2 NEGATIVE NEGATIVE Final    Comment: (NOTE) If result is NEGATIVE SARS-CoV-2 target nucleic acids are NOT DETECTED. The SARS-CoV-2 RNA is generally detectable in upper and lower  respiratory specimens during the acute phase of infection. The lowest  concentration of SARS-CoV-2 viral copies this assay can detect is 250  copies / mL. A negative result does not preclude SARS-CoV-2 infection  and should not be used as the sole basis for treatment or other  patient management decisions.  A negative result may occur with  improper specimen collection / handling, submission of specimen other  than nasopharyngeal swab, presence of viral mutation(s) within the  areas targeted by this assay, and inadequate number of viral copies  (<250 copies / mL). A negative result must be combined with clinical  observations, patient history, and epidemiological information. If result is  POSITIVE SARS-CoV-2 target nucleic acids are DETECTED. The SARS-CoV-2 RNA is generally detectable in upper and lower  respiratory specimens dur ing the acute phase of infection.  Positive  results are indicative of active infection with SARS-CoV-2.  Clinical  correlation with patient history and other diagnostic information is  necessary to determine patient infection status.  Positive results do  not rule out bacterial infection or co-infection with other viruses. If result is PRESUMPTIVE POSTIVE SARS-CoV-2 nucleic acids MAY BE PRESENT.   A presumptive positive result was obtained on the submitted specimen  and confirmed on repeat testing.  While 2019 novel coronavirus  (SARS-CoV-2) nucleic acids may be present in the submitted sample  additional confirmatory testing may be necessary for epidemiological  and / or clinical  management purposes  to differentiate between  SARS-CoV-2 and other Sarbecovirus currently known to infect humans.  If clinically indicated additional testing with an alternate test  methodology [phone]) is advised. The SARS-CoV-2 RNA is generally  detectable in upper and lower respiratory sp ecimens during the acute  phase of infection. The expected result is Negative. Fact Sheet for Patients:  StrictlyIdeas.no Fact Sheet for Healthcare Providers: BankingDealers.co.za This test is not yet approved or cleared by the [place] FDA and has been authorized for detection and/or diagnosis of SARS-CoV-2 by FDA under an Emergency Use Authorization (EUA).  This EUA will remain in effect (meaning this test can be used) for the duration of the COVID-19 declaration under Section 564(b)(1) of the Act, 21 U.S.C. section 360bbb-3(b)(1), unless the authorization is terminated or revoked sooner. Performed at [facility], [place] [address] [place], [place] [address]   Culture, blood (Routine X 2) w Reflex to ID Panel     Status: Abnormal   Collection Time: 07/12/18  9:25 PM  Result Value Ref Range Status   Specimen Description BLOOD LEFT HAND  Final   Special Requests   Final    BOTTLES DRAWN AEROBIC AND ANAEROBIC Blood Culture results may not be optimal due to an inadequate volume of blood received in culture bottles   Culture  Setup Time ANAEROBIC BOTTLE ONLY GRAM NEGATIVE RODS   Final   Culture (A)  Final    ESCHERICHIA COLI SUSCEPTIBILITIES PERFORMED ON PREVIOUS CULTURE WITHIN THE LAST 5 DAYS. Performed at [facility], [place] [address] [place], [place] [address]    Report Status 07/15/2018 FINAL  Final  Culture, blood (Routine X 2) w Reflex to ID Panel     Status: Abnormal   Collection Time: 07/12/18 10:07 PM  Result Value Ref Range Status   Specimen Description BLOOD RIGHT ANTECUBITAL  Final   Special Requests   Final     BOTTLES DRAWN AEROBIC AND ANAEROBIC Blood Culture adequate volume Performed at [facility], [place] [address] [place], [place] [address]    Culture  Setup Time   Final    IN BOTH AEROBIC AND ANAEROBIC BOTTLES GRAM NEGATIVE RODS    Culture ESCHERICHIA COLI (A)  Final   Report Status 07/15/2018 FINAL  Final   Organism ID, Bacteria ESCHERICHIA COLI  Final      Susceptibility   Escherichia coli - MIC*    AMPICILLIN >=32 RESISTANT Resistant     CEFAZOLIN <=4 SENSITIVE Sensitive     CEFEPIME <=1 SENSITIVE Sensitive     CEFTAZIDIME <=1 SENSITIVE Sensitive     CEFTRIAXONE <=1 SENSITIVE Sensitive     CIPROFLOXACIN <=0.25 SENSITIVE Sensitive     GENTAMICIN <=1  SENSITIVE Sensitive     IMIPENEM <=0.25 SENSITIVE Sensitive     TRIMETH/SULFA >=320 RESISTANT Resistant     AMPICILLIN/SULBACTAM 8 SENSITIVE Sensitive     PIP/TAZO <=4 SENSITIVE Sensitive     Extended ESBL NEGATIVE Sensitive     * ESCHERICHIA COLI  Blood Culture ID Panel (Reflexed)     Status: Abnormal   Collection Time: 07/12/18 10:07 PM  Result Value Ref Range Status   Enterococcus species NOT DETECTED NOT DETECTED Final   Listeria monocytogenes NOT DETECTED NOT DETECTED Final   Staphylococcus species NOT DETECTED NOT DETECTED Final   Staphylococcus aureus (BCID) NOT DETECTED NOT DETECTED Final   Streptococcus species NOT DETECTED NOT DETECTED Final   Streptococcus agalactiae NOT DETECTED NOT DETECTED Final   Streptococcus pneumoniae NOT DETECTED NOT DETECTED Final   Streptococcus pyogenes NOT DETECTED NOT DETECTED Final   Acinetobacter baumannii NOT DETECTED NOT DETECTED Final   Enterobacteriaceae species DETECTED (A) NOT DETECTED Final    Comment: Enterobacteriaceae represent a large family of gram-negative bacteria, not a single organism. CRITICAL RESULT CALLED TO, READ BACK BY AND VERIFIED WITH: MIKE MACCIA @ 2440 ON 07/13/18 BY ROBINSON Z.     Enterobacter cloacae complex NOT DETECTED NOT DETECTED Final   Escherichia  coli DETECTED (A) NOT DETECTED Final    Comment: CRITICAL RESULT CALLED TO, READ BACK BY AND VERIFIED WITH: MIKE MACCIA @ 1027 ON 07/13/18 BY ROBINSON Z.     Klebsiella oxytoca NOT DETECTED NOT DETECTED Final   Klebsiella pneumoniae NOT DETECTED NOT DETECTED Final   Proteus species NOT DETECTED NOT DETECTED Final   Serratia marcescens NOT DETECTED NOT DETECTED Final   Carbapenem resistance NOT DETECTED NOT DETECTED Final   Haemophilus influenzae NOT DETECTED NOT DETECTED Final   Neisseria meningitidis NOT DETECTED NOT DETECTED Final   Pseudomonas aeruginosa NOT DETECTED NOT DETECTED Final   Candida albicans NOT DETECTED NOT DETECTED Final   Candida glabrata NOT DETECTED NOT DETECTED Final   Candida krusei NOT DETECTED NOT DETECTED Final   Candida parapsilosis NOT DETECTED NOT DETECTED Final   Candida tropicalis NOT DETECTED NOT DETECTED Final    Comment: Performed at [facility], [place] [address], [place], [place] [address]  MRSA PCR Screening     Status: None   Collection Time: 07/12/18 11:46 PM  Result Value Ref Range Status   MRSA by PCR NEGATIVE NEGATIVE Final    Comment:        The GeneXpert MRSA Assay (FDA approved for NASAL specimens only), is one component of a comprehensive MRSA colonization surveillance program. It is not intended to diagnose MRSA infection nor to guide or monitor treatment for MRSA infections. Performed at [facility], [place] [address], [place], [place] [address]   Surgical pcr screen     Status: None   Collection Time: 07/16/18  4:03 AM  Result Value Ref Range Status   MRSA, PCR NEGATIVE NEGATIVE Final   Staphylococcus aureus NEGATIVE NEGATIVE Final    Comment: (NOTE) The Xpert SA Assay (FDA approved for NASAL specimens in patients 24 years of age and older), is one component of a comprehensive surveillance program. It is not intended to diagnose infection nor to guide or monitor treatment. Performed at [facility], [place]  [address], [place], [place] [address]      Studies: Dg Ercp Biliary & Pancreatic Ducts  Result Date: 07/14/2018 CLINICAL DATA:  Choledocholithiasis, biliary obstruction EXAM: ERCP sphincterotomy, balloon extraction, and endoscopic stent placement  TECHNIQUE: Multiple spot images obtained with the fluoroscopic device and submitted for interpretation post-procedure. FLUOROSCOPY TIME:  Fluoroscopy Time:  1 minutes 49 seconds Radiation Exposure Index (if provided by the fluoroscopic device): Not available Number of Acquired Spot Images: 4 COMPARISON:  07/12/2018 FINDINGS: Limited ERCP imaging during the procedure. Diffuse biliary dilatation. Nonobstructing filling defects in the mid CBD. Balloon extraction performed for stone removal. Endoscopic temporary biliary stent in place in the distal CBD. Please refer to the ERCP notes for further details of the intervention. IMPRESSION: Choledocholithiasis and biliary dilatation. These images were submitted for radiologic interpretation only. Please see the procedural report for the amount of contrast and the fluoroscopy time utilized. Electronically Signed   By: Jerilynn Mages.  Shick M.D.   On: 07/14/2018 16:04    Scheduled Meds: . Chlorhexidine Gluconate Cloth  6 each Topical Once  . [MAR Hold] diltiazem  120 mg Oral Daily  . [MAR Hold] enoxaparin (LOVENOX) injection  30 mg Subcutaneous Q24H  . [MAR Hold] levothyroxine  125 mcg Oral Q0600  . [MAR Hold] lisinopril  20 mg Oral Daily  . [MAR Hold] multivitamin with minerals  1 tablet Oral Daily  . [MAR Hold] pantoprazole (PROTONIX) IV  40 mg Intravenous Q12H    Continuous Infusions: . lactated ringers 10 mL/hr at 07/16/18 1019  . [MAR Hold] piperacillin-tazobactam (ZOSYN)  IV 3.375 g (07/16/18 0539)     Flora Lipps, MD  [facility] 07/16/2018

## 2018-07-17 ENCOUNTER — Encounter (HOSPITAL_COMMUNITY): Payer: Self-pay | Admitting: Surgery

## 2018-07-17 LAB — CBC
HCT: 38.7 % (ref 36.0–46.0)
Hemoglobin: 12.9 g/dL (ref 12.0–15.0)
MCH: 30.6 pg (ref 26.0–34.0)
MCHC: 33.3 g/dL (ref 30.0–36.0)
MCV: 91.9 fL (ref 80.0–100.0)
Platelets: 227 10*3/uL (ref 150–400)
RBC: 4.21 MIL/uL (ref 3.87–5.11)
RDW: 13.3 % (ref 11.5–15.5)
WBC: 8.7 10*3/uL (ref 4.0–10.5)
nRBC: 0 % (ref 0.0–0.2)

## 2018-07-17 LAB — COMPREHENSIVE METABOLIC PANEL
ALT: 142 U/L — ABNORMAL HIGH (ref 0–44)
AST: 120 U/L — ABNORMAL HIGH (ref 15–41)
Albumin: 2.7 g/dL — ABNORMAL LOW (ref 3.5–5.0)
Alkaline Phosphatase: 124 U/L (ref 38–126)
Anion gap: 13 (ref 5–15)
BUN: 15 mg/dL (ref 8–23)
CO2: 26 mmol/L (ref 22–32)
Calcium: 8.9 mg/dL (ref 8.9–10.3)
Chloride: 98 mmol/L (ref 98–111)
Creatinine, Ser: 1.27 mg/dL — ABNORMAL HIGH (ref 0.44–1.00)
GFR calc Af Amer: 43 mL/min — ABNORMAL LOW (ref >60)
GFR calc non Af Amer: 37 mL/min — ABNORMAL LOW (ref >60)
Glucose, Bld: 152 mg/dL — ABNORMAL HIGH (ref 70–99)
Potassium: 3.8 mmol/L (ref 3.5–5.1)
Sodium: 137 mmol/L (ref 135–145)
Total Bilirubin: 2.4 mg/dL — ABNORMAL HIGH (ref 0.3–1.2)
Total Protein: 7 g/dL (ref 6.5–8.1)

## 2018-07-17 MED ORDER — ONDANSETRON HCL 4 MG PO TABS: 4.0000 mg | ORAL_TABLET | Freq: Three times a day (TID) | ORAL | 0 refills | Status: AC | PRN

## 2018-07-17 MED ORDER — CEFDINIR 300 MG PO CAPS: 300.0000 mg | ORAL_CAPSULE | Freq: Two times a day (BID) | ORAL | 0 refills | Status: DC

## 2018-07-17 NOTE — Discharge Summary (Signed)
Physician Discharge Summary  April Hodges QQI:[id number] DOB: [birth year] DOA: 07/12/2018  PCP: Lajean Manes, MD  Admit date: 07/12/2018   Discharge date: 07/17/2018  Admitted From: Home  Discharge disposition: Home   Recommendations for Outpatient Follow-Up:   Follow up with your primary care provider in one week. Follow up with [place] [facility] as outpatient.   Discharge Diagnosis:   Principal Problem:   Biliary colic Active Problems:   Essential hypertension   Hypothyroidism   CKD (chronic kidney disease), stage III (HCC)   Cholelithiasis   Chronic diastolic CHF (congestive heart failure) (HCC)   Lung nodule Choledocholithiasis   Discharge Condition: Improved.  Diet recommendation: Low sodium, heart healthy.   Wound care: None.  Code status: Full.   History of Present Illness:  April Hodges a 85 y.o.femalewith medical history significant ofhypertension, hyperlipidemia, CKD stage III, GERD, hypothyroidism, sinus bradycardia,presented with abdominal pain, nausea vomiting. CT noted distended gallbladder, gallstones.  Patient was then admitted to hospital for choledocholithiasis with possible early cholangitis.  HIDA scan was performed which showed high-grade CBD obstruction and GI was consulted.  Patient underwent ERCP with stent placement on 07/14/2018.  GI recommended surgical intervention.  Patient then subsequently underwent a laparoscopic cholecystectomy.  Hospital Course:  Patient was admitted to the hospital and following conditions were addressed during hospitalization.  Choledocholithiasis,cholelithiasis and possible early cholangitis CT scan and ultrasound did not show CBD obstruction but HIDA scan showed a high-grade CBD obstruction.  CT scan of the abdomen showed gallstones/sludge.  Patient was seen by GI and underwent ERCP with sphincterectomy and stent placement 07/14/2018.  Bilirubin trended down.  GI recommended surgical evaluation for  cholecystectomy.   Patient underwent laparoscopic cholecystectomy on 07/16/2018.  Postoperative course was unremarkable.  Patient was continued on Zosyn for early cholangitis.  Blood cultures grew E. coli.    She was afebrile and her white blood cell count was within normal limits.  She was however advised continuation of antibiotic for bacteremia.  E. coli bacteremia.    Patient received IV Zosyn during hospitalization.    Culture with resistant to ampicillin.  We will continue cefdinir to complete the course for E. coli bacteremia.  Spoke with  pharmacy regarding the dose.  Essential hypertension: Blood pressure is still elevated.  Continue lisinopril, Cardizem.  Patient will need to follow-up with primary care physician as outpatient to optimize blood pressure.  Hypothyroidism; -Continue Synthroid  CKD (chronic kidney disease), stage III ([place]): stable.Baseline creatinine 1.0-1.3. Creatinine prior to discharge 1.2.  Chronic diastolic CHF (congestive heart failure) (HCC):2D echo 11/28/2017 showed EF 60 to 65% with grade 1 diastolic dysfunction.  Compensated at this time.  Not on diuretics at baseline.   Lung nodule:Incidental findingson CTA.  Will need to follow-up with her PCP on discharge  Disposition.  At this time, patient is stable for disposition home.  I spoke with the patient's son on the phone and updated him about the clinical condition of the patient and the plan for follow-up and disposition.  Medical Consultants:    GI  General surgery  Subjective:   Today, patient feels good.  Denies any nausea, vomiting or abdominal pain.  Has not had a bowel movement.  Discharge Exam:   Vitals:   07/17/18 0529 07/17/18 0951  BP: (!) 160/63 (!) 178/76  Pulse: 76 60  Resp: 18 18  Temp: 98 F (36.7 C) 98 F (36.7 C)  SpO2: 94% 92%   Vitals:   07/16/18 1607 07/16/18 2121 07/17/18  0529 07/17/18 0951  BP: (!) 172/66 (!) 161/77 (!) 160/63 (!) 178/76  Pulse: 70 76 76 60   Resp: 19 18 18 18   Temp:  98 F (36.7 C) 98 F (36.7 C) 98 F (36.7 C)  TempSrc:   Oral Oral  SpO2: 94% 91% 94% 92%  Weight:  70 kg    Height:       General exam: Appears calm and comfortable ,Not in distress HEENT:PERRL,Oral mucosa moist Respiratory system: Bilateral equal air entry, normal vesicular breath sounds, no wheezes or crackles  Cardiovascular system: S1 & S2 heard, RRR.  Gastrointestinal system: Abdomen is nondistended, soft and nontender. No organomegaly or masses felt. Normal bowel sounds heard. Central nervous system: Alert and oriented. No focal neurological deficits. Extremities: No edema, no clubbing ,no cyanosis, distal peripheral pulses palpable. Skin: No rashes, lesions or ulcers,no icterus ,no pallor MSK: Normal muscle bulk,tone ,power    Procedures:    laparoscopic cholecystectomy on 07/16/2018 ERCP with sphincterectomy and stent placement 07/14/2018  The results of significant diagnostics from this hospitalization (including imaging, microbiology, ancillary and laboratory) are listed below for reference.     Diagnostic Studies:   Nm Hepatobiliary Liver Func  Result Date: 07/13/2018 CLINICAL DATA:  Abdominal pain.  Epigastric pain. EXAM: NUCLEAR MEDICINE HEPATOBILIARY IMAGING TECHNIQUE: Sequential images of the abdomen were obtained out to 60 minutes following intravenous administration of radiopharmaceutical. RADIOPHARMACEUTICALS:  5.46 mCi Tc-49m  Choletec IV COMPARISON:  Right upper quadrant ultrasound Jul 12, 2018 FINDINGS: There is prompt uptake of radiotracer in the liver. No excretion is seen from the liver during the 4 hours of imaging. Very little background activity seen outside the liver. IMPRESSION: 1. The prompt uptake of radiotracer in the liver with no washout by 4 hours could be seen in the setting of high-grade common bile duct obstruction or severe underlying liver dysfunction. Given a dilated common bile duct, 10.8 mm, seen on the recent right  upper quadrant ultrasound as well as the rapid uptake of radiotracer in the liver and the lack of background activity, a high-grade common bile duct obstruction is favored. Review of the CT scan from yesterday also demonstrates common bile duct dilatation in retrospect. An underlying cause for the dilatation is not identified on the CT scan. Recommend an MRCP or ERCP for better evaluation. These results will be called to the ordering clinician or representative by the Radiologist Assistant, and communication documented in the PACS or zVision Dashboard. Electronically Signed   By: Dorise Bullion III M.D   On: 07/13/2018 15:52   US Abdomen Complete  Result Date: 07/12/2018 CLINICAL DATA:  Abdominal pain since 2 p.m. today. EXAM: ABDOMEN ULTRASOUND COMPLETE COMPARISON:  None. FINDINGS: Gallbladder: Cholelithiasis. No gallbladder wall thickening. No pericholecystic fluid. No sonographic Murphy sign. Common bile duct: Diameter: 10.8 mm.  No choledocholithiasis. Liver: No focal hepatic lesion. Mild coarse hepatic echotexture with normal contour. Portal vein is patent on color Doppler imaging with normal direction of blood flow towards the liver. IVC: No abnormality visualized. Pancreas: Limited visualization secondary to overlying bowel gas. Spleen: Size and appearance within normal limits. Right Kidney: Length: 8.9 cm. Renal cortical thinning. Echogenicity within normal limits. No mass or hydronephrosis visualized. Left Kidney: Length: 7.8 cm. Renal cortical thinning. Echogenicity within normal limits. No mass or hydronephrosis visualized. Abdominal aorta: No aneurysm visualized. Other findings: None. IMPRESSION: 1. Cholelithiasis without sonographic evidence of acute cholecystitis. 2. Bilateral renal atrophy. Electronically Signed   By: Kathreen Devoid   On: 07/12/2018  18:13   Dg Chest Port 1 View  Result Date: 07/12/2018 CLINICAL DATA:  Per [facility] pt coming from home ([facility]) after having a  sudden onset of mid epigastric pain radiating into back after gardening. Patient given 324 aspirin, 8mg  zofran and 2 nitro with no relief. Patient alert and orientated x 4. EXAM: PORTABLE CHEST - 1 VIEW COMPARISON:  06/02/2015 FINDINGS: Lungs are clear. Heart size upper limits normal. Aortic Atherosclerosis (ICD10-170.0). No effusion. Visualized bones unremarkable. IMPRESSION: No acute cardiopulmonary disease. Electronically Signed   By: Lucrezia Europe M.D.   On: 07/12/2018 17:18   Ct Angio Chest/abd/pel For Dissection W And/or Wo Contrast  Result Date: 07/12/2018 CLINICAL DATA:  Sudden onset of epigastric pain radiating into back after gardening. EXAM: CT ANGIOGRAPHY CHEST, ABDOMEN AND PELVIS TECHNIQUE: Multidetector CT imaging through the chest, abdomen and pelvis was performed using the standard protocol during bolus administration of intravenous contrast. Multiplanar reconstructed images and MIPs were obtained and reviewed to evaluate the vascular anatomy. CONTRAST:  55mL OMNIPAQUE IOHEXOL 350 MG/ML SOLN COMPARISON:  Chest x-ray Jul 12, 2018 FINDINGS: CTA CHEST FINDINGS Cardiovascular: Mild cardiomegaly. Coronary artery calcifications involve the LAD and circumflex coronary arteries. Visualized pulmonary arteries are normal in appearance with no filling defects or pulmonary emboli noted. Mild scattered atherosclerotic changes are seen in the thoracic aorta. No aneurysm or dissection in the thoracic aorta. Mediastinum/Nodes: No enlarged mediastinal, hilar, or axillary lymph nodes. Thyroid gland, trachea, and esophagus demonstrate no significant findings. Lungs/Pleura: Mild scarring in the medial apices bilaterally. No pneumothorax. Central airways are normal. There is a nodule in the lateral left lung base measuring 7 mm in mean diameter. No other nodules. No masses or infiltrates. Musculoskeletal: See below. Review of the MIP images confirms the above findings. CTA ABDOMEN AND PELVIS FINDINGS VASCULAR Aorta: The  abdominal aorta is tortuous without aneurysm or dissection. Celiac: Mild atherosclerotic changes are seen near the origin of the celiac artery with no narrowing. SMA: Patent without evidence of aneurysm, dissection, vasculitis or significant stenosis. Renals: Both renal arteries are patent without evidence of aneurysm, dissection, vasculitis, fibromuscular dysplasia or significant stenosis. IMA: Patent without evidence of aneurysm, dissection, vasculitis or significant stenosis. Inflow: Patent without evidence of aneurysm, dissection, vasculitis or significant stenosis. Veins: No obvious venous abnormality within the limitations of this arterial phase study. The iliac and proximal femoral arteries are normal in appearance with mild scattered atherosclerotic changes. Review of the MIP images confirms the above findings. NON-VASCULAR Hepatobiliary: Evaluation is limited due to arterial phase imaging. No liver masses are identified. The portal vein is not well assessed due to timing of contrast. The gallbladder is distended. A rounded region of high attenuation in the gallbladder is likely a stone or tumefactive sludge measuring up to 3.3 cm. No wall thickening or pericholecystic fluid noted. Pancreas: There is fatty infiltration of the pancreatic head of no acute significance. No suspicious masses identified or evidence of pancreatitis. Spleen: Normal in size without focal abnormality. Adrenals/Urinary Tract: Adrenal glands are normal. Bilateral renal cysts are identified. No hydronephrosis or acute perinephric stranding. No ureteral stones. The bladder is normal. Stomach/Bowel: The stomach is normal. A duodenal diverticulum is identified off the second portion of the duodenum without evidence of inflammation, of no acute significance. The small bowel is otherwise normal. Colonic diverticulosis is seen without diverticulitis. The appendix is normal. Lymphatic: No significant vascular findings are present. No enlarged  abdominal or pelvic lymph nodes. Reproductive: Status post hysterectomy. No adnexal masses. Other:  No abdominal wall hernia or abnormality. No abdominopelvic ascites. Musculoskeletal: L4 pars defects are noted with grade 2 anterolisthesis of L4 versus L5. No other malalignment. Severe degenerative changes in the lumbar spine with more mild degenerative changes in the thoracic spine. Lower lumbar facet degenerative changes identified. Other bones are unremarkable. Review of the MIP images confirms the above findings. IMPRESSION: 1. No aortic aneurysm or dissection. 2. The gallbladder is somewhat distended. A rounded region of high attenuation in the gallbladder measuring 3.3 cm is likely a stone or tumefactive sludge. No wall thickening or pericholecystic fluid. If there is clinical concern for acute cholecystitis, recommend ultrasound. 3. Significant degenerative changes in the lumbar spine. Scattered degenerative changes seen in the thoracic spine as well. 4. 7 mm nodule in the left lower lobe. Non-contrast chest CT at 6-12 months is recommended. If the nodule is stable at time of repeat CT, then future CT at 18-24 months (from today's scan) is considered optional for low-risk patients, but is recommended for high-risk patients. This recommendation follows the consensus statement: Guidelines for Management of Incidental Pulmonary Nodules Detected on CT Images: From the Fleischner Society 2017; Radiology 2017; 284:228-243. 5. Coronary artery calcifications in the LAD and circumflex arteries. 6. Colonic diverticulosis without diverticulitis. Electronically Signed   By: Dorise Bullion III M.D   On: 07/12/2018 19:59     Labs:   Basic Metabolic Panel: Recent Labs  Lab 07/13/18 1630 07/14/18 0335 07/15/18 0419 07/16/18 0430 07/17/18 0359  NA 139 140 138 139 137  K 4.4 4.4 4.1 3.6 3.8  CL 105 107 103 98 98  CO2 24 22 23 27 26   GLUCOSE 89 79 110* 106* 152*  BUN 26* 21 24* 18 15  CREATININE 1.58* 1.48*  1.58* 1.39* 1.27*  CALCIUM 9.0 8.8* 9.0 9.1 8.9   GFR Estimated Creatinine Clearance: 25.1 mL/min (A) (by C-G formula based on SCr of 1.27 mg/dL (H)). Liver Function Tests: Recent Labs  Lab 07/13/18 1630 07/14/18 0335 07/15/18 0419 07/16/18 0430 07/17/18 0359  AST 301* 181* 100* 68* 120*  ALT 350* 268* 190* 147* 142*  ALKPHOS 122 113 130* 123 124  BILITOT 5.8* 6.3* 5.7* 3.6* 2.4*  PROT 6.6 6.4* 6.7 6.5 7.0  ALBUMIN 3.0* 2.8* 2.8* 2.7* 2.7*   Recent Labs  Lab 07/12/18 1703  LIPASE 33   No results for input(s): AMMONIA in the last 168 hours. Coagulation profile Recent Labs  Lab 07/12/18 2125  INR 1.0    CBC: Recent Labs  Lab 07/12/18 1703 07/13/18 0350 07/14/18 0335 07/15/18 0419 07/16/18 0430 07/17/18 0359  WBC 9.7 16.3* 8.9 7.9 6.8 8.7  NEUTROABS 7.9*  --   --   --   --   --   HGB 13.2 12.7 11.8* 12.1 11.8* 12.9  HCT 41.0 38.8 35.9* 37.2 35.3* 38.7  MCV 94.0 92.2 93.5 92.5 92.4 91.9  PLT 253 229 196 203 205 227   Cardiac Enzymes: Recent Labs  Lab 07/12/18 1703  TROPONINI <0.03   BNP: Invalid input(s): POCBNP CBG: No results for input(s): GLUCAP in the last 168 hours. D-Dimer No results for input(s): DDIMER in the last 72 hours. Hgb A1c No results for input(s): HGBA1C in the last 72 hours. Lipid Profile No results for input(s): CHOL, HDL, LDLCALC, TRIG, CHOLHDL, LDLDIRECT in the last 72 hours. Thyroid function studies No results for input(s): TSH, T4TOTAL, T3FREE, THYROIDAB in the last 72 hours.  Invalid input(s): FREET3 Anemia work up No results for input(s): VITAMINB12, FOLATE,  FERRITIN, TIBC, IRON, RETICCTPCT in the last 72 hours. Microbiology Recent Results (from the past 240 hour(s))  SARS Coronavirus 2 (CEPHEID - Performed in [place] hospital lab), Hosp Order     Status: None   Collection Time: 07/12/18  8:17 PM  Result Value Ref Range Status   SARS Coronavirus 2 NEGATIVE NEGATIVE Final    Comment: (NOTE) If result is  NEGATIVE SARS-CoV-2 target nucleic acids are NOT DETECTED. The SARS-CoV-2 RNA is generally detectable in upper and lower  respiratory specimens during the acute phase of infection. The lowest  concentration of SARS-CoV-2 viral copies this assay can detect is 250  copies / mL. A negative result does not preclude SARS-CoV-2 infection  and should not be used as the sole basis for treatment or other  patient management decisions.  A negative result may occur with  improper specimen collection / handling, submission of specimen other  than nasopharyngeal swab, presence of viral mutation(s) within the  areas targeted by this assay, and inadequate number of viral copies  (<250 copies / mL). A negative result must be combined with clinical  observations, patient history, and epidemiological information. If result is POSITIVE SARS-CoV-2 target nucleic acids are DETECTED. The SARS-CoV-2 RNA is generally detectable in upper and lower  respiratory specimens dur ing the acute phase of infection.  Positive  results are indicative of active infection with SARS-CoV-2.  Clinical  correlation with patient history and other diagnostic information is  necessary to determine patient infection status.  Positive results do  not rule out bacterial infection or co-infection with other viruses. If result is PRESUMPTIVE POSTIVE SARS-CoV-2 nucleic acids MAY BE PRESENT.   A presumptive positive result was obtained on the submitted specimen  and confirmed on repeat testing.  While 2019 novel coronavirus  (SARS-CoV-2) nucleic acids may be present in the submitted sample  additional confirmatory testing may be necessary for epidemiological  and / or clinical management purposes  to differentiate between  SARS-CoV-2 and other Sarbecovirus currently known to infect humans.  If clinically indicated additional testing with an alternate test  methodology [phone]) is advised. The SARS-CoV-2 RNA is generally  detectable  in upper and lower respiratory sp ecimens during the acute  phase of infection. The expected result is Negative. Fact Sheet for Patients:  StrictlyIdeas.no Fact Sheet for Healthcare Providers: BankingDealers.co.za This test is not yet approved or cleared by the [place] FDA and has been authorized for detection and/or diagnosis of SARS-CoV-2 by FDA under an Emergency Use Authorization (EUA).  This EUA will remain in effect (meaning this test can be used) for the duration of the COVID-19 declaration under Section 564(b)(1) of the Act, 21 U.S.C. section 360bbb-3(b)(1), unless the authorization is terminated or revoked sooner. Performed at [facility], [place] [address], [place], [place] [address]   Culture, blood (Routine X 2) w Reflex to ID Panel     Status: Abnormal   Collection Time: 07/12/18  9:25 PM  Result Value Ref Range Status   Specimen Description BLOOD LEFT HAND  Final   Special Requests   Final    BOTTLES DRAWN AEROBIC AND ANAEROBIC Blood Culture results may not be optimal due to an inadequate volume of blood received in culture bottles   Culture  Setup Time ANAEROBIC BOTTLE ONLY GRAM NEGATIVE RODS   Final   Culture (A)  Final    ESCHERICHIA COLI SUSCEPTIBILITIES PERFORMED ON PREVIOUS CULTURE WITHIN THE LAST 5 DAYS. Performed at [facility],  [address] [place], [place] [address]    Report Status 07/15/2018 FINAL  Final  Culture, blood (Routine X 2) w Reflex to ID Panel     Status: Abnormal   Collection Time: 07/12/18 10:07 PM  Result Value Ref Range Status   Specimen Description BLOOD RIGHT ANTECUBITAL  Final   Special Requests   Final    BOTTLES DRAWN AEROBIC AND ANAEROBIC Blood Culture adequate volume Performed at [facility], [place] [address], [place], [place] [address]    Culture  Setup Time   Final    IN BOTH AEROBIC AND ANAEROBIC BOTTLES GRAM NEGATIVE RODS    Culture ESCHERICHIA COLI  (A)  Final   Report Status 07/15/2018 FINAL  Final   Organism ID, Bacteria ESCHERICHIA COLI  Final      Susceptibility   Escherichia coli - MIC*    AMPICILLIN >=32 RESISTANT Resistant     CEFAZOLIN <=4 SENSITIVE Sensitive     CEFEPIME <=1 SENSITIVE Sensitive     CEFTAZIDIME <=1 SENSITIVE Sensitive     CEFTRIAXONE <=1 SENSITIVE Sensitive     CIPROFLOXACIN <=0.25 SENSITIVE Sensitive     GENTAMICIN <=1 SENSITIVE Sensitive     IMIPENEM <=0.25 SENSITIVE Sensitive     TRIMETH/SULFA >=320 RESISTANT Resistant     AMPICILLIN/SULBACTAM 8 SENSITIVE Sensitive     PIP/TAZO <=4 SENSITIVE Sensitive     Extended ESBL NEGATIVE Sensitive     * ESCHERICHIA COLI  Blood Culture ID Panel (Reflexed)     Status: Abnormal   Collection Time: 07/12/18 10:07 PM  Result Value Ref Range Status   Enterococcus species NOT DETECTED NOT DETECTED Final   Listeria monocytogenes NOT DETECTED NOT DETECTED Final   Staphylococcus species NOT DETECTED NOT DETECTED Final   Staphylococcus aureus (BCID) NOT DETECTED NOT DETECTED Final   Streptococcus species NOT DETECTED NOT DETECTED Final   Streptococcus agalactiae NOT DETECTED NOT DETECTED Final   Streptococcus pneumoniae NOT DETECTED NOT DETECTED Final   Streptococcus pyogenes NOT DETECTED NOT DETECTED Final   Acinetobacter baumannii NOT DETECTED NOT DETECTED Final   Enterobacteriaceae species DETECTED (A) NOT DETECTED Final    Comment: Enterobacteriaceae represent a large family of gram-negative bacteria, not a single organism. CRITICAL RESULT CALLED TO, READ BACK BY AND VERIFIED WITH: MIKE MACCIA @ 0981 ON 07/13/18 BY ROBINSON Z.     Enterobacter cloacae complex NOT DETECTED NOT DETECTED Final   Escherichia coli DETECTED (A) NOT DETECTED Final    Comment: CRITICAL RESULT CALLED TO, READ BACK BY AND VERIFIED WITH: MIKE MACCIA @ 1914 ON 07/13/18 BY ROBINSON Z.     Klebsiella oxytoca NOT DETECTED NOT DETECTED Final   Klebsiella pneumoniae NOT DETECTED NOT DETECTED Final    Proteus species NOT DETECTED NOT DETECTED Final   Serratia marcescens NOT DETECTED NOT DETECTED Final   Carbapenem resistance NOT DETECTED NOT DETECTED Final   Haemophilus influenzae NOT DETECTED NOT DETECTED Final   Neisseria meningitidis NOT DETECTED NOT DETECTED Final   Pseudomonas aeruginosa NOT DETECTED NOT DETECTED Final   Candida albicans NOT DETECTED NOT DETECTED Final   Candida glabrata NOT DETECTED NOT DETECTED Final   Candida krusei NOT DETECTED NOT DETECTED Final   Candida parapsilosis NOT DETECTED NOT DETECTED Final   Candida tropicalis NOT DETECTED NOT DETECTED Final    Comment: Performed at [facility], [place] [address] [place], [place] [address]  MRSA PCR Screening     Status: None   Collection Time: 07/12/18 11:46 PM  Result Value Ref Range Status   MRSA by PCR NEGATIVE NEGATIVE Final    Comment:        The GeneXpert MRSA Assay (FDA approved for NASAL specimens only), is one component of a comprehensive MRSA colonization surveillance program. It is not intended to diagnose MRSA infection nor to guide or monitor treatment for MRSA infections. Performed at [facility], [place] [address] [place], [place] [address]   Surgical pcr screen     Status: None   Collection Time: 07/16/18  4:03 AM  Result Value Ref Range Status   MRSA, PCR NEGATIVE NEGATIVE Final   Staphylococcus aureus NEGATIVE NEGATIVE Final    Comment: (NOTE) The Xpert SA Assay (FDA approved for NASAL specimens in patients 38 years of age and older), is one component of a comprehensive surveillance program. It is not intended to diagnose infection nor to guide or monitor treatment. Performed at [facility], [place] [address], [place], [place] [address]      Discharge Instructions:   Discharge Instructions    Call MD for:  persistant nausea and vomiting   Complete by:  As directed    Call MD for:  redness, tenderness, or signs of infection (pain, swelling, redness, odor or  green/yellow discharge around incision site)   Complete by:  As directed    Call MD for:  severe uncontrolled pain   Complete by:  As directed    Call MD for:  temperature >100.4   Complete by:  As directed    Diet - low sodium heart healthy   Complete by:  As directed    Discharge instructions   Complete by:  As directed    Follow up with [place] [facility] for post operative follow up. Complete the course of antibiotics.  Follow-up with Dr. Therisa D[facility] in 2 weeks regarding ERCP stent removal in the future.   Increase activity slowly   Complete by:  As directed      Allergies as of 07/17/2018      Reactions   Amlodipine Swelling   Coughing as well   Bystolic [nebivolol Hcl] Other (See Comments)   Bradycardia - HR in the 40s      Medication List    TAKE these medications   acetaminophen 500 MG tablet Commonly known as:  TYLENOL Take 500 mg by mouth See admin instructions. 500mg  by mouth every morning and daily as needed for pain   cefdinir 300 MG capsule Commonly known as:  OMNICEF Take 1 capsule (300 mg total) by mouth 2 (two) times daily.   diltiazem 120 MG 24 hr capsule Commonly known as:  CARDIZEM CD Take 120 mg by mouth daily.   furosemide 20 MG tablet Commonly known as:  LASIX TAKE 1 TABLET BY MOUTH EVERY DAY   levothyroxine 125 MCG tablet Commonly known as:  SYNTHROID Take 1 tablet (125 mcg total) by mouth daily.   lisinopril 20 MG tablet Commonly known as:  ZESTRIL Take 1 tablet (20 mg total) by mouth daily. NEED OV.   MULTIVITAMIN PO Take 1 tablet by mouth daily.   nitroGLYCERIN 0.4 MG SL tablet Commonly known as:  NITROSTAT Place 1 tablet (0.4 mg total) under the tongue every 5 (five) minutes x 3 doses as needed for chest pain.   omeprazole 10 MG capsule Commonly known as:  PRILOSEC Take 10 mg by mouth daily.   ondansetron 4 MG tablet Commonly known as:  Zofran Take 1 tablet (4 mg total) by  mouth every 8 (eight) hours as needed for up to 5 days for  nausea or vomiting.      Follow-up [facility], [place]. Call on 07/29/2018.   Specialty:  General Surgery Why:  5/19 at 11:15. Due to coronavirus we are decreasing foot traffic in office. Instead of coming to an appt a provider will call you at the above date/time. Send picture of your incision with your name and date of birth to [email or web address] Contact information: [address] [place], [place], [place] Follow up in 1 week(s).   Specialty:  Internal Medicine Contact information: [address] [place] [address] [phone]        Ronnette Juniper, MD. Schedule an appointment as soon as possible for a visit in 2 week(s).   Specialty:  Gastroenterology Why:  to discuss about ERCP stent removal. Contact information: [place] [address] [phone]          Time coordinating discharge: 39 minutes  Signed:  Earnestine Tuohey  [facility] 07/17/2018, 11:13 AM

## 2018-07-17 NOTE — Care Management Important Message (Signed)
Important Message  Patient Details  Name: April Hodges MRN: [id number] Date of Birth: 06/26/1925   Medicare Important Message Given:  Yes    [name] 07/17/2018, 1:50 PM

## 2018-07-17 NOTE — Progress Notes (Signed)
DISCHARGE NOTE  Nneka Blanda to be discharged Home per MD order. Patient verbalized understanding.  Skin clean, dry and intact without evidence of skin break down, no evidence of skin tears noted. IV catheter discontinued intact. Site without signs and symptoms of complications. Dressing and pressure applied. Pt denies pain at the site currently. No complaints noted.  Patient free of lines, drains, and wounds.   Discharge packet assembled. An After Visit Summary (AVS) was printed and given to the EMS personnel. Patient escorted via wheelchair and discharge to home via private auto.  Babs Sciara, RN

## 2018-07-17 NOTE — Progress Notes (Signed)
Subjective: Patient had laparoscopic cholecystectomy yesterday, intraoperative cholangiogram was not performed due to a short cystic duct takeoff.  She is likely going to be discharged today. Complains of mild abdominal discomfort and bloating and has not had a bowel movement since admission.  Denies nausea or vomiting, was started on solids today morning.  Objective: Vital signs in last 24 hours: Temp:  [97 F (36.1 C)-98 F (36.7 C)] 98 F (36.7 C) (05/07 0529) Pulse Rate:  [70-95] 76 (05/07 0529) Resp:  [18-25] 18 (05/07 0529) BP: (160-214)/(56-85) 160/63 (05/07 0529) SpO2:  [91 %-95 %] 94 % (05/07 0529) Weight:  [70 kg-70.3 kg] 70 kg (05/06 2121) Weight change: 0 kg Last BM Date: 07/11/18  PE: Appears younger than stated age GENERAL: Improvement in icterus ABDOMEN: Mild distention EXTREMITIES: No deformity  Lab Results: Results for orders placed or performed during the hospital encounter of 07/12/18 (from the past 48 hour(s))  Surgical pcr screen     Status: None   Collection Time: 07/16/18  4:03 AM  Result Value Ref Range   MRSA, PCR NEGATIVE NEGATIVE   Staphylococcus aureus NEGATIVE NEGATIVE    Comment: (NOTE) The Xpert SA Assay (FDA approved for NASAL specimens in patients 20 years of age and older), is one component of a comprehensive surveillance program. It is not intended to diagnose infection nor to guide or monitor treatment. Performed at [facility], [place] [address], [place], [place] [address]   CBC     Status: Abnormal   Collection Time: 07/16/18  4:30 AM  Result Value Ref Range   WBC 6.8 4.0 - 10.5 K/uL   RBC 3.82 (L) 3.87 - 5.11 MIL/uL   Hemoglobin 11.8 (L) 12.0 - 15.0 g/dL   HCT 35.3 (L) 36.0 - 46.0 %   MCV 92.4 80.0 - 100.0 fL   MCH 30.9 26.0 - 34.0 pg   MCHC 33.4 30.0 - 36.0 g/dL   RDW 13.2 11.5 - 15.5 %   Platelets 205 150 - 400 K/uL   nRBC 0.0 0.0 - 0.2 %    Comment: Performed at [facility], [place] [address], [place], [place]  [address]  Comprehensive metabolic panel     Status: Abnormal   Collection Time: 07/16/18  4:30 AM  Result Value Ref Range   Sodium 139 135 - 145 mmol/L   Potassium 3.6 3.5 - 5.1 mmol/L   Chloride 98 98 - 111 mmol/L   CO2 27 22 - 32 mmol/L   Glucose, Bld 106 (H) 70 - 99 mg/dL   BUN 18 8 - 23 mg/dL   Creatinine, Ser 1.39 (H) 0.44 - 1.00 mg/dL   Calcium 9.1 8.9 - 10.3 mg/dL   Total Protein 6.5 6.5 - 8.1 g/dL   Albumin 2.7 (L) 3.5 - 5.0 g/dL   AST 68 (H) 15 - 41 U/L   ALT 147 (H) 0 - 44 U/L   Alkaline Phosphatase 123 38 - 126 U/L   Total Bilirubin 3.6 (H) 0.3 - 1.2 mg/dL   GFR calc non Af Amer 33 (L) >60 mL/min   GFR calc Af Amer 39 (L) >60 mL/min   Anion gap 14 5 - 15    Comment: Performed at [facility] [address], [place] [address]  CBC     Status: None   Collection Time: 07/17/18  3:59 AM  Result Value Ref Range   WBC 8.7 4.0 - 10.5 K/uL   RBC 4.21 3.87 - 5.11  MIL/uL   Hemoglobin 12.9 12.0 - 15.0 g/dL   HCT 38.7 36.0 - 46.0 %   MCV 91.9 80.0 - 100.0 fL   MCH 30.6 26.0 - 34.0 pg   MCHC 33.3 30.0 - 36.0 g/dL   RDW 13.3 11.5 - 15.5 %   Platelets 227 150 - 400 K/uL   nRBC 0.0 0.0 - 0.2 %    Comment: Performed at [facility], [place] [address] [place], [place] [address]  Comprehensive metabolic panel     Status: Abnormal   Collection Time: 07/17/18  3:59 AM  Result Value Ref Range   Sodium 137 135 - 145 mmol/L   Potassium 3.8 3.5 - 5.1 mmol/L   Chloride 98 98 - 111 mmol/L   CO2 26 22 - 32 mmol/L   Glucose, Bld 152 (H) 70 - 99 mg/dL   BUN 15 8 - 23 mg/dL   Creatinine, Ser 1.27 (H) 0.44 - 1.00 mg/dL   Calcium 8.9 8.9 - 10.3 mg/dL   Total Protein 7.0 6.5 - 8.1 g/dL   Albumin 2.7 (L) 3.5 - 5.0 g/dL   AST 120 (H) 15 - 41 U/L   ALT 142 (H) 0 - 44 U/L   Alkaline Phosphatase 124 38 - 126 U/L   Total Bilirubin 2.4 (H) 0.3 - 1.2 mg/dL   GFR calc non Af Amer 37 (L) >60 mL/min   GFR calc Af Amer 43 (L) >60 mL/min   Anion gap 13 5 - 15    Comment:  Performed at [facility] [address] [place], [place] [address]    Studies/Results: No results found.  Medications: I have reviewed the patient's current medications.  Assessment: Status post ERCP with sphincterotomy and CBD stone removal, required epinephrine and plastic stent placement for bleeding noted post enterotomy LFTs improving(T bili 2.4/AST 120/ALT 142/ALP 124) No leukocytosis, no anemia Laparoscopic cholecystectomy yesterday   Plan: Patient to be discharged today With follow-up in office with repeat hepatic panel in 2 weeks, will decide on timing of removal of plastic CBD stent accordingly. Patient verbalized understanding that plastic CBD stent are temporary and will need to be removed, otherwise can cause obstruction with an increased chance of cholangitis.    Ronnette Juniper 07/17/2018, 9:51 AM   Pager [phone] If no answer or after 5 PM call [phone]

## 2018-07-17 NOTE — Anesthesia Postprocedure Evaluation (Signed)
Anesthesia Post Note  Patient: April Hodges  Procedure(s) Performed: LAPAROSCOPIC CHOLECYSTECTOMY (N/A )     Patient location during evaluation: PACU Anesthesia Type: General Level of consciousness: awake and alert Pain management: pain level controlled Vital Signs Assessment: post-procedure vital signs reviewed and stable Respiratory status: spontaneous breathing, nonlabored ventilation, respiratory function stable and patient connected to nasal cannula oxygen Cardiovascular status: blood pressure returned to baseline and stable Postop Assessment: no apparent nausea or vomiting Anesthetic complications: no    Last Vitals:  Vitals:   07/16/18 2121 07/17/18 0529  BP: (!) 161/77 (!) 160/63  Pulse: 76 76  Resp: 18 18  Temp: 36.7 C 36.7 C  SpO2: 91% 94%    Last Pain:  Vitals:   07/17/18 0529  TempSrc: Oral  PainSc:                  [place] S

## 2018-07-17 NOTE — Progress Notes (Signed)
[facility] Progress Note  1 Day Post-Op  Subjective: CC-  Feeling well today. Asking if she can go home. Abdominal pain well controlled with tylenol. Denies n/v. Tolerating diet. Passing flatus.  Objective: Vital signs in last 24 hours: Temp:  [97 F (36.1 C)-98 F (36.7 C)] 98 F (36.7 C) (05/07 0529) Pulse Rate:  [70-95] 76 (05/07 0529) Resp:  [18-25] 18 (05/07 0529) BP: (160-214)/(56-85) 160/63 (05/07 0529) SpO2:  [91 %-95 %] 94 % (05/07 0529) Weight:  [70 kg-70.3 kg] 70 kg (05/06 2121) Last BM Date: 07/11/18  Intake/Output from previous day: 05/06 0701 - 05/07 0700 In: 1355.4 [P.O.:340; I.V.:870.6; IV Piggyback:144.8] Out: 1800 [Urine:1800] Intake/Output this shift: No intake/output data recorded.  PE: Gen:  Alert, NAD, pleasant HEENT: EOM's intact, pupils equal and round Pulm:  effort normal Abd: Soft, ND, appropriately tender, +BS, lap incisions C/D/I  Lab Results:  Recent Labs    07/16/18 0430 07/17/18 0359  WBC 6.8 8.7  HGB 11.8* 12.9  HCT 35.3* 38.7  PLT 205 227   BMET Recent Labs    07/16/18 0430 07/17/18 0359  NA 139 137  K 3.6 3.8  CL 98 98  CO2 27 26  GLUCOSE 106* 152*  BUN 18 15  CREATININE 1.39* 1.27*  CALCIUM 9.1 8.9   PT/INR No results for input(s): LABPROT, INR in the last 72 hours. CMP     Component Value Date/Time   NA 137 07/17/2018 0359   K 3.8 07/17/2018 0359   CL 98 07/17/2018 0359   CO2 26 07/17/2018 0359   GLUCOSE 152 (H) 07/17/2018 0359   BUN 15 07/17/2018 0359   CREATININE 1.27 (H) 07/17/2018 0359   CREATININE 1.39 (H) 10/17/2015 0953   CALCIUM 8.9 07/17/2018 0359   PROT 7.0 07/17/2018 0359   ALBUMIN 2.7 (L) 07/17/2018 0359   AST 120 (H) 07/17/2018 0359   ALT 142 (H) 07/17/2018 0359   ALKPHOS 124 07/17/2018 0359   BILITOT 2.4 (H) 07/17/2018 0359   GFRNONAA 37 (L) 07/17/2018 0359   GFRAA 43 (L) 07/17/2018 0359   Lipase     Component Value Date/Time   LIPASE 33 07/12/2018 1703        Studies/Results: No results found.  Anti-infectives: Anti-infectives (From admission, onward)   Start     Dose/Rate Route Frequency Ordered Stop   07/16/18 0600  ceFAZolin (ANCEF) IVPB 2g/100 mL premix     2 g 200 mL/hr over 30 Minutes Intravenous On call to O.R. 07/15/18 2016 07/16/18 1149   07/13/18 0600  piperacillin-tazobactam (ZOSYN) IVPB 3.375 g     3.375 g 12.5 mL/hr over 240 Minutes Intravenous Every 8 hours 07/12/18 2127     07/12/18 2030  piperacillin-tazobactam (ZOSYN) IVPB 3.375 g     3.375 g 100 mL/hr over 30 Minutes Intravenous  Once 07/12/18 2017 07/12/18 2329       Assessment/Plan  Calculus of gallbladder and bile duct with acute cholecystitis, without mention of obstruction S/p ERCP 5/4 Dr. Therisa Doyne S/p laparoscopic cholecystectomy 5/6 Dr. Brantley Stage - POD 1 - tolerating diet, pain controlled  ID - zosyn 5/2>> FEN - reg diet VTE - SCDs, lovenox Foley - none Follow up - DOW, GI  Plan - Patient stable for discharge from surgical standpoint. She does not need any more antibiotics. F/u info and discharge instructions on AVS.   LOS: 5 days    Wellington Hampshire , [facility] 07/17/2018, 9:09 AM Pager: [phone] Mon-Thurs 7:00 am-4:30 pm Fri  7:00 am -11:30 AM Sat-Sun 7:00 am-11:30 am

## 2018-07-17 NOTE — Progress Notes (Signed)
Pt refused bed alarm. Educated pt on why we had it on the bed. Will continue to monitor.

## 2018-07-22 DIAGNOSIS — I129 Hypertensive chronic kidney disease with stage 1 through stage 4 chronic kidney disease, or unspecified chronic kidney disease: Secondary | ICD-10-CM | POA: Diagnosis not present

## 2018-07-22 DIAGNOSIS — E039 Hypothyroidism, unspecified: Secondary | ICD-10-CM | POA: Diagnosis not present

## 2018-07-22 DIAGNOSIS — M1990 Unspecified osteoarthritis, unspecified site: Secondary | ICD-10-CM | POA: Diagnosis not present

## 2018-07-22 DIAGNOSIS — N183 Chronic kidney disease, stage 3 (moderate): Secondary | ICD-10-CM | POA: Diagnosis not present

## 2018-07-22 DIAGNOSIS — I10 Essential (primary) hypertension: Secondary | ICD-10-CM | POA: Diagnosis not present

## 2018-07-24 ENCOUNTER — Other Ambulatory Visit: Payer: Self-pay | Admitting: Gastroenterology

## 2018-07-28 ENCOUNTER — Other Ambulatory Visit: Payer: Self-pay | Admitting: Gastroenterology

## 2018-08-14 ENCOUNTER — Other Ambulatory Visit: Payer: Self-pay

## 2018-08-14 ENCOUNTER — Other Ambulatory Visit (HOSPITAL_COMMUNITY)
Admission: RE | Admit: 2018-08-14 | Discharge: 2018-08-14 | Disposition: A | Discharge status: Home / Self Care | Payer: PPO | Source: Ambulatory Visit | Attending: Gastroenterology | Admitting: Gastroenterology

## 2018-08-14 ENCOUNTER — Encounter (HOSPITAL_COMMUNITY): Payer: Self-pay | Admitting: *Deleted

## 2018-08-14 DIAGNOSIS — Z1159 Encounter for screening for other viral diseases: Secondary | ICD-10-CM | POA: Diagnosis not present

## 2018-08-15 LAB — NOVEL CORONAVIRUS, NAA (HOSP ORDER, SEND-OUT TO REF LAB; TAT 18-24 HRS): SARS-CoV-2, NAA: NOT DETECTED

## 2018-08-18 ENCOUNTER — Ambulatory Visit (HOSPITAL_COMMUNITY): Payer: PPO | Admitting: Certified Registered"

## 2018-08-18 ENCOUNTER — Ambulatory Visit (HOSPITAL_COMMUNITY)
Admission: RE | Admit: 2018-08-18 | Discharge: 2018-08-18 | Disposition: A | Discharge status: Home / Self Care | Payer: PPO | Attending: Gastroenterology | Admitting: Gastroenterology

## 2018-08-18 ENCOUNTER — Other Ambulatory Visit: Payer: Self-pay

## 2018-08-18 ENCOUNTER — Encounter (HOSPITAL_COMMUNITY)
Admission: RE | Disposition: A | Discharge status: Home / Self Care | Payer: Self-pay | Source: Home / Self Care | Attending: Gastroenterology

## 2018-08-18 ENCOUNTER — Encounter (HOSPITAL_COMMUNITY): Payer: Self-pay | Admitting: Certified Registered"

## 2018-08-18 DIAGNOSIS — Z96653 Presence of artificial knee joint, bilateral: Secondary | ICD-10-CM | POA: Insufficient documentation

## 2018-08-18 DIAGNOSIS — Z888 Allergy status to other drugs, medicaments and biological substances status: Secondary | ICD-10-CM | POA: Diagnosis not present

## 2018-08-18 DIAGNOSIS — K8020 Calculus of gallbladder without cholecystitis without obstruction: Secondary | ICD-10-CM | POA: Diagnosis not present

## 2018-08-18 DIAGNOSIS — Z4589 Encounter for adjustment and management of other implanted devices: Secondary | ICD-10-CM | POA: Diagnosis not present

## 2018-08-18 DIAGNOSIS — Z9049 Acquired absence of other specified parts of digestive tract: Secondary | ICD-10-CM | POA: Diagnosis not present

## 2018-08-18 DIAGNOSIS — R011 Cardiac murmur, unspecified: Secondary | ICD-10-CM | POA: Insufficient documentation

## 2018-08-18 DIAGNOSIS — Z8249 Family history of ischemic heart disease and other diseases of the circulatory system: Secondary | ICD-10-CM | POA: Insufficient documentation

## 2018-08-18 DIAGNOSIS — Z79899 Other long term (current) drug therapy: Secondary | ICD-10-CM | POA: Diagnosis not present

## 2018-08-18 DIAGNOSIS — E039 Hypothyroidism, unspecified: Secondary | ICD-10-CM | POA: Insufficient documentation

## 2018-08-18 DIAGNOSIS — K5710 Diverticulosis of small intestine without perforation or abscess without bleeding: Secondary | ICD-10-CM | POA: Insufficient documentation

## 2018-08-18 DIAGNOSIS — Z85828 Personal history of other malignant neoplasm of skin: Secondary | ICD-10-CM | POA: Insufficient documentation

## 2018-08-18 DIAGNOSIS — K219 Gastro-esophageal reflux disease without esophagitis: Secondary | ICD-10-CM | POA: Diagnosis not present

## 2018-08-18 DIAGNOSIS — Z90710 Acquired absence of both cervix and uterus: Secondary | ICD-10-CM | POA: Diagnosis not present

## 2018-08-18 DIAGNOSIS — N183 Chronic kidney disease, stage 3 (moderate): Secondary | ICD-10-CM | POA: Diagnosis not present

## 2018-08-18 DIAGNOSIS — I4510 Unspecified right bundle-branch block: Secondary | ICD-10-CM | POA: Diagnosis not present

## 2018-08-18 DIAGNOSIS — I129 Hypertensive chronic kidney disease with stage 1 through stage 4 chronic kidney disease, or unspecified chronic kidney disease: Secondary | ICD-10-CM | POA: Insufficient documentation

## 2018-08-18 DIAGNOSIS — R51 Headache: Secondary | ICD-10-CM | POA: Diagnosis not present

## 2018-08-18 DIAGNOSIS — Z4659 Encounter for fitting and adjustment of other gastrointestinal appliance and device: Secondary | ICD-10-CM | POA: Diagnosis not present

## 2018-08-18 DIAGNOSIS — M1990 Unspecified osteoarthritis, unspecified site: Secondary | ICD-10-CM | POA: Insufficient documentation

## 2018-08-18 DIAGNOSIS — R001 Bradycardia, unspecified: Secondary | ICD-10-CM | POA: Insufficient documentation

## 2018-08-18 DIAGNOSIS — E785 Hyperlipidemia, unspecified: Secondary | ICD-10-CM | POA: Diagnosis not present

## 2018-08-18 HISTORY — PX: STENT REMOVAL: SHX6421

## 2018-08-18 HISTORY — PX: ESOPHAGOGASTRODUODENOSCOPY (EGD) WITH PROPOFOL: SHX5813

## 2018-08-18 SURGERY — ESOPHAGOGASTRODUODENOSCOPY (EGD) WITH PROPOFOL
Anesthesia: Monitor Anesthesia Care

## 2018-08-18 MED ORDER — PROPOFOL 10 MG/ML IV BOLUS
INTRAVENOUS | Status: AC
  Filled 2018-08-18: qty 20

## 2018-08-18 MED ORDER — SODIUM CHLORIDE 0.9 % IV SOLN: INTRAVENOUS | Status: DC

## 2018-08-18 MED ORDER — LIDOCAINE 2% (20 MG/ML) 5 ML SYRINGE
INTRAMUSCULAR | Status: DC | PRN
  Administered 2018-08-18 (×2): 40 mg via INTRAVENOUS

## 2018-08-18 MED ORDER — PROPOFOL 10 MG/ML IV BOLUS
INTRAVENOUS | Status: DC | PRN
  Administered 2018-08-18 (×4): 40 mg via INTRAVENOUS

## 2018-08-18 MED ORDER — LACTATED RINGERS IV SOLN
INTRAVENOUS | Status: AC | PRN
  Administered 2018-08-18: 1000 mL via INTRAVENOUS

## 2018-08-18 SURGICAL SUPPLY — 15 items

## 2018-08-18 NOTE — Anesthesia Procedure Notes (Signed)
Procedure Name: MAC Date/Time: 08/18/2018 8:44 AM Performed by: Cynda Familia, CRNA Pre-anesthesia Checklist: Patient identified, Emergency Drugs available, Suction available, Patient being monitored and Timeout performed Patient Re-evaluated:Patient Re-evaluated prior to induction Oxygen Delivery Method: Nasal cannula Placement Confirmation: positive ETCO2 and breath sounds checked- equal and bilateral Dental Injury: Teeth and Oropharynx as per pre-operative assessment  Comments: Bite block by GI tech

## 2018-08-18 NOTE — Brief Op Note (Signed)
08/18/2018  9:10 AM  PATIENT:  April Hodges  85 y.o. female  PRE-OPERATIVE DIAGNOSIS:  stent removal  POST-OPERATIVE DIAGNOSIS:  stent removal  PROCEDURE:  Procedure(s) with comments: ESOPHAGOGASTRODUODENOSCOPY (EGD) WITH PROPOFOL (N/A) - stent removal  SURGEON:  Surgeon(s) and Role:    Ronnette Juniper, MD - Primary  PHYSICIAN ASSISTANT:   ASSISTANTS: Gwyneth Revels, Tech ANESTHESIA:   MAC  EBL:  None  BLOOD ADMINISTERED:none  DRAINS: none   LOCAL MEDICATIONS USED:  NONE  SPECIMEN:  No Specimen  DISPOSITION OF SPECIMEN:  N/A  COUNTS:  YES  TOURNIQUET:  * No tourniquets in log *  DICTATION: .Dragon Dictation  PLAN OF CARE: Discharge to home after PACU  PATIENT DISPOSITION:  PACU - hemodynamically stable.   Delay start of Pharmacological VTE agent (>24hrs) due to surgical blood loss or risk of bleeding: no

## 2018-08-18 NOTE — Transfer of Care (Signed)
Immediate Anesthesia Transfer of Care Note  Patient: April Hodges  Procedure(s) Performed: ESOPHAGOGASTRODUODENOSCOPY (EGD) WITH PROPOFOL (N/A )  Patient Location: PACU and Endoscopy Unit  Anesthesia Type:MAC  Level of Consciousness: awake and alert   Airway & Oxygen Therapy: Patient Spontanous Breathing and Patient connected to nasal cannula oxygen  Post-op Assessment: Report given to RN and Post -op Vital signs reviewed and stable  Post vital signs: Reviewed and stable  Last Vitals:  Vitals Value Taken Time  BP    Temp    Pulse    Resp    SpO2      Last Pain:  Vitals:   08/18/18 0827  TempSrc: Oral  PainSc: 0-No pain         Complications: No apparent anesthesia complications

## 2018-08-18 NOTE — Anesthesia Postprocedure Evaluation (Signed)
Anesthesia Post Note  Patient: Shanetra Blumenstock  Procedure(s) Performed: ESOPHAGOGASTRODUODENOSCOPY (EGD) WITH PROPOFOL (N/A )     Patient location during evaluation: Endoscopy Anesthesia Type: MAC Level of consciousness: awake and alert Pain management: pain level controlled Vital Signs Assessment: post-procedure vital signs reviewed and stable Respiratory status: spontaneous breathing, nonlabored ventilation, respiratory function stable and patient connected to nasal cannula oxygen Cardiovascular status: stable and blood pressure returned to baseline Postop Assessment: no apparent nausea or vomiting Anesthetic complications: no    Last Vitals:  Vitals:   08/18/18 0925 08/18/18 0930  BP: (!) 143/51 (!) 162/51  Pulse: (!) 52 (!) 51  Resp: (!) 22 (!) 21  Temp: (!) 36.4 C   SpO2: 93% 94%    Last Pain:  Vitals:   08/18/18 0925  TempSrc: Oral  PainSc: 0-No pain                 Jassica Zazueta P Kylei Purington

## 2018-08-18 NOTE — Op Note (Signed)
[facility] Patient Name: April Hodges Procedure Date: 08/18/2018 MRN: [id number] Attending MD: Ronnette Juniper , MD Date of Birth: 04/19/1925 CSN: [id number] Age: 85 Admit Type: Outpatient Procedure:                Upper GI endoscopy Indications:              Biliary stent removal Providers:                Ronnette Juniper, MD, Cleda Daub, RN, Marguerita Merles,                            Technician Referring MD:              Medicines:                Monitored Anesthesia Care Complications:             Estimated Blood Loss:     Estimated blood loss: none. Procedure:                Pre-Anesthesia Assessment:                           - Prior to the procedure, a History and Physical                            was performed, and patient medications and                            allergies were reviewed. The patient's tolerance of                            previous anesthesia was also reviewed. The risks                            and benefits of the procedure and the sedation                            options and risks were discussed with the patient.                            All questions were answered, and informed consent                            was obtained. Prior Anticoagulants: The patient has                            taken no previous anticoagulant or antiplatelet                            agents. ASA Grade Assessment: III - A patient with                            severe systemic disease. After reviewing the risks                            and benefits,  the patient was deemed in                            satisfactory condition to undergo the procedure.                           After obtaining informed consent, the side viewing                            endoscope was passed under direct vision.                            Throughout the procedure, the patient's blood                            pressure, pulse, and oxygen saturations were        monitored continuously. The [id number]-Q180V ([id number])                            Olympus duodenoscope was introduced through the                            mouth, and advanced to the second part of duodenum.                            The upper GI endoscopy was accomplished without                            difficulty. The patient tolerated the procedure                            well. Scope In: Scope Out: Findings:      The examined esophagus was normal.      The entire examined stomach was normal.      A large diverticulum was found in the second portion of the duodenum.      A previously placed plastic stent was seen in the area of the papilla.       Stent removal was accomplished with a snare. Impression:               - Normal esophagus.                           - Normal stomach.                           - Duodenal diverticulum.                           - Plastic stent in the duodenum. Removed. Moderate Sedation:      Patient did not receive moderate sedation for this procedure, but       instead received monitored anesthesia care. Recommendation:           - Patient has a contact number available for  emergencies. The signs and symptoms of potential                            delayed complications were discussed with the                            patient. Return to normal activities tomorrow.                            Written discharge instructions were provided to the                            patient.                           - Resume regular diet.                           - Continue present medications. Procedure Code(s):        --- Professional ---                           [phone], Esophagogastroduodenoscopy, flexible,                            transoral; with removal of foreign body(s) Diagnosis Code(s):        --- Professional ---                           Z46.59, Encounter for fitting and adjustment of                            other  gastrointestinal appliance and device                           K57.10, Diverticulosis of small intestine without                            perforation or abscess without bleeding CPT copyright 2019 American Medical Association. All rights reserved. The codes documented in this report are preliminary and upon coder review may  be revised to meet current compliance requirements. Ronnette Juniper, MD 08/18/2018 9:10:04 AM This report has been signed electronically. Number of Addenda: 0

## 2018-08-18 NOTE — Discharge Instructions (Signed)

## 2018-08-18 NOTE — Anesthesia Preprocedure Evaluation (Addendum)
Anesthesia Evaluation  Patient identified by MRN, date of birth, ID band Patient awake    Reviewed: Allergy & Precautions, NPO status , Patient's Chart, lab work & pertinent test results  History of Anesthesia Complications (+) PONV and history of anesthetic complications  Airway Mallampati: II  TM Distance: >3 FB Neck ROM: Full    Dental no notable dental hx.    Pulmonary neg pulmonary ROS,    Pulmonary exam normal breath sounds clear to auscultation       Cardiovascular hypertension, Pt. on medications Normal cardiovascular exam Rhythm:Regular Rate:Normal  ECG: rate 49. Sinus or ectopic atrial bradycardia RBBB and LAFB Probable left ventricular hypertrophy  ECHO: LV EF: 60% -   65%   Neuro/Psych  Headaches, negative psych ROS   GI/Hepatic Neg liver ROS, GERD  Medicated and Controlled,  Endo/Other  Hypothyroidism   Renal/GU Renal InsufficiencyRenal disease     Musculoskeletal negative musculoskeletal ROS (+)   Abdominal (+) + obese,   Peds  Hematology negative hematology ROS (+)   Anesthesia Other Findings stent removal  Reproductive/Obstetrics                            Anesthesia Physical Anesthesia Plan  ASA: III  Anesthesia Plan: MAC   Post-op Pain Management:    Induction: Intravenous  PONV Risk Score and Plan: 3 and Propofol infusion and Treatment may vary due to age or medical condition  Airway Management Planned: Nasal Cannula  Additional Equipment:   Intra-op Plan:   Post-operative Plan:   Informed Consent: I have reviewed the patients History and Physical, chart, labs and discussed the procedure including the risks, benefits and alternatives for the proposed anesthesia with the patient or authorized representative who has indicated his/her understanding and acceptance.     Dental advisory given  Plan Discussed with: CRNA  Anesthesia Plan Comments:          Anesthesia Quick Evaluation

## 2018-08-18 NOTE — H&P (Signed)
April Hodges is an 85 y.o. female.   Chief Complaint: CBD stent removal HPI: 89/female had an ERCP with plastic stent placement on 07/14/2018 and needs CBD stent removal. No other GI complaints.  Past Medical History:  Diagnosis Date  . Acute on chronic renal failure ([place]) 06/03/2015  . Arthritis   . Cancer ([place])    skin cancer  . CKD (chronic kidney disease), stage III ([place])   . Demand ischemia ([place])    a. 05/2015 in setting of sepsis w/ troponin peak of 8.47;  b. 05/2015 Echo: EF 60-65%, no rwma, Gr1 DD-->No ischemic eval undertaken.  . Essential hypertension 06/03/2015  . GERD (gastroesophageal reflux disease)   . Headache    in younger years  . Hypothyroidism   . Pneumonia    as a child  . PONV (postoperative nausea and vomiting)   . Sinus bradycardia    a. states rates in 40's and low 50's are normal for her-->No AVN blocking agents.  . Systolic murmur    a. 05/7104 No sigificant valvular dzs on echo.  . Urosepsis 06/03/2015    Past Surgical History:  Procedure Laterality Date  . BILATERAL CARPAL TUNNEL RELEASE    . BILIARY STENT PLACEMENT  07/14/2018   Procedure: BILIARY STENT PLACEMENT;  Surgeon: Ronnette Juniper, MD;  Location: [facility] ENDOSCOPY;  Service: Gastroenterology;;  . CHOLECYSTECTOMY N/A 07/16/2018   Procedure: LAPAROSCOPIC CHOLECYSTECTOMY;  Surgeon: Erroll Luna, MD;  Location: [place];  Service: General;  Laterality: N/A;  . COLONOSCOPY    . ERCP N/A 07/14/2018   Procedure: ENDOSCOPIC RETROGRADE CHOLANGIOPANCREATOGRAPHY (ERCP);  Surgeon: Ronnette Juniper, MD;  Location: [place];  Service: Gastroenterology;  Laterality: N/A;  . EYE SURGERY Bilateral    cataracts removed and lens implant  . JOINT REPLACEMENT Bilateral    knees  . LUMBAR LAMINECTOMY/DECOMPRESSION MICRODISCECTOMY N/A 06/10/2014   Procedure: LUMBAR DECOMPRESSION L3 - L5 2 LEVELS;  Surgeon: Melina Schools, MD;  Location: [place];  Service: Orthopedics;  Laterality: N/A;  . REMOVAL OF STONES  07/14/2018   Procedure:  REMOVAL OF STONES;  Surgeon: Ronnette Juniper, MD;  Location: [facility] ENDOSCOPY;  Service: Gastroenterology;;  . Clide Deutscher  07/14/2018   Procedure: Clide Deutscher;  Surgeon: Ronnette Juniper, MD;  Location: [facility] ENDOSCOPY;  Service: Gastroenterology;;  . Joan Mayans  07/14/2018   Procedure: SPHINCTEROTOMY;  Surgeon: Ronnette Juniper, MD;  Location: [place];  Service: Gastroenterology;;  . TONSILLECTOMY    . VAGINAL HYSTERECTOMY      Family History  Problem Relation Age of Onset  . Congestive Heart Failure Mother   . Heart attack Father    Social History:  reports that she has never smoked. She has never used smokeless tobacco. She reports current alcohol use. She reports that she does not use drugs.  Allergies:  Allergies  Allergen Reactions  . Amlodipine Swelling    Coughing as well  . Bystolic [Nebivolol Hcl] Other (See Comments)    Bradycardia - HR in the 40s    Medications Prior to Admission  Medication Sig Dispense Refill  . acetaminophen (TYLENOL) 500 MG tablet Take 500 mg by mouth See admin instructions. 500mg  by mouth every morning and 500 mg as needed for pain    . diltiazem (CARDIZEM CD) 120 MG 24 hr capsule Take 120 mg by mouth daily.    . furosemide (LASIX) 20 MG tablet TAKE 1 TABLET BY MOUTH EVERY DAY (Patient taking differently: Take 20 mg by mouth daily. ) 90 tablet 0  . levothyroxine (SYNTHROID, LEVOTHROID)  125 MCG tablet Take 1 tablet (125 mcg total) by mouth daily. 30 tablet 3  . lisinopril (PRINIVIL,ZESTRIL) 20 MG tablet Take 1 tablet (20 mg total) by mouth daily. NEED OV. 90 tablet 0  . Multiple Vitamins-Minerals (MULTIVITAMIN PO) Take 1 tablet by mouth daily.    Marland Kitchen omeprazole (PRILOSEC) 10 MG capsule Take 10 mg by mouth daily.    . cefdinir (OMNICEF) 300 MG capsule Take 1 capsule (300 mg total) by mouth 2 (two) times daily. (Patient not taking: Reported on 08/13/2018) 10 capsule 0    No results found for this or any previous visit (from the past 48 hour(s)). No results  found.  Review of Systems  Constitutional: Negative.   HENT: Negative.   Eyes: Negative.   Respiratory: Negative.   Cardiovascular: Negative.   Gastrointestinal: Negative.   Musculoskeletal: Negative.   Skin: Negative.   Neurological: Negative.   Psychiatric/Behavioral: Negative.     There were no vitals taken for this visit. Physical Exam  Constitutional: She is oriented to person, place, and time. She appears well-developed and well-nourished.  HENT:  Head: Normocephalic.  Eyes: Conjunctivae are normal.  Neck: Normal range of motion. Neck supple.  Cardiovascular: Normal rate.  Respiratory: Effort normal.  GI: She exhibits no distension.  Musculoskeletal: Normal range of motion.  Neurological: She is alert and oriented to person, place, and time.  Skin: Skin is warm and dry.  Psychiatric: She has a normal mood and affect.     Assessment/Plan CBD stent in place EGD for CBD stent removal. The risks and benefits of the procedure were discussed with the patient in details. She understands and verbalizes consent.  Ronnette Juniper, MD 08/18/2018, 8:27 AM

## 2018-08-19 ENCOUNTER — Encounter (HOSPITAL_COMMUNITY): Payer: Self-pay | Admitting: Gastroenterology

## 2018-08-21 DIAGNOSIS — E039 Hypothyroidism, unspecified: Secondary | ICD-10-CM | POA: Diagnosis not present

## 2018-08-21 DIAGNOSIS — I129 Hypertensive chronic kidney disease with stage 1 through stage 4 chronic kidney disease, or unspecified chronic kidney disease: Secondary | ICD-10-CM | POA: Diagnosis not present

## 2018-08-21 DIAGNOSIS — M1990 Unspecified osteoarthritis, unspecified site: Secondary | ICD-10-CM | POA: Diagnosis not present

## 2018-08-21 DIAGNOSIS — N183 Chronic kidney disease, stage 3 (moderate): Secondary | ICD-10-CM | POA: Diagnosis not present

## 2018-08-21 DIAGNOSIS — I10 Essential (primary) hypertension: Secondary | ICD-10-CM | POA: Diagnosis not present

## 2018-09-30 DIAGNOSIS — M1990 Unspecified osteoarthritis, unspecified site: Secondary | ICD-10-CM | POA: Diagnosis not present

## 2018-09-30 DIAGNOSIS — I129 Hypertensive chronic kidney disease with stage 1 through stage 4 chronic kidney disease, or unspecified chronic kidney disease: Secondary | ICD-10-CM | POA: Diagnosis not present

## 2018-09-30 DIAGNOSIS — I10 Essential (primary) hypertension: Secondary | ICD-10-CM | POA: Diagnosis not present

## 2018-09-30 DIAGNOSIS — E039 Hypothyroidism, unspecified: Secondary | ICD-10-CM | POA: Diagnosis not present

## 2018-09-30 DIAGNOSIS — N183 Chronic kidney disease, stage 3 (moderate): Secondary | ICD-10-CM | POA: Diagnosis not present

## 2018-10-20 DIAGNOSIS — E039 Hypothyroidism, unspecified: Secondary | ICD-10-CM | POA: Diagnosis not present

## 2018-10-20 DIAGNOSIS — N183 Chronic kidney disease, stage 3 (moderate): Secondary | ICD-10-CM | POA: Diagnosis not present

## 2018-10-20 DIAGNOSIS — I129 Hypertensive chronic kidney disease with stage 1 through stage 4 chronic kidney disease, or unspecified chronic kidney disease: Secondary | ICD-10-CM | POA: Diagnosis not present

## 2018-10-20 DIAGNOSIS — I10 Essential (primary) hypertension: Secondary | ICD-10-CM | POA: Diagnosis not present

## 2018-10-20 DIAGNOSIS — M1990 Unspecified osteoarthritis, unspecified site: Secondary | ICD-10-CM | POA: Diagnosis not present

## 2018-10-31 DIAGNOSIS — N183 Chronic kidney disease, stage 3 (moderate): Secondary | ICD-10-CM | POA: Diagnosis not present

## 2018-10-31 DIAGNOSIS — Z79899 Other long term (current) drug therapy: Secondary | ICD-10-CM | POA: Diagnosis not present

## 2018-10-31 DIAGNOSIS — I129 Hypertensive chronic kidney disease with stage 1 through stage 4 chronic kidney disease, or unspecified chronic kidney disease: Secondary | ICD-10-CM | POA: Diagnosis not present

## 2018-10-31 DIAGNOSIS — E039 Hypothyroidism, unspecified: Secondary | ICD-10-CM | POA: Diagnosis not present

## 2018-11-19 DIAGNOSIS — Z79899 Other long term (current) drug therapy: Secondary | ICD-10-CM | POA: Diagnosis not present

## 2018-12-10 DIAGNOSIS — I10 Essential (primary) hypertension: Secondary | ICD-10-CM | POA: Diagnosis not present

## 2018-12-10 DIAGNOSIS — N183 Chronic kidney disease, stage 3 (moderate): Secondary | ICD-10-CM | POA: Diagnosis not present

## 2018-12-10 DIAGNOSIS — I129 Hypertensive chronic kidney disease with stage 1 through stage 4 chronic kidney disease, or unspecified chronic kidney disease: Secondary | ICD-10-CM | POA: Diagnosis not present

## 2018-12-10 DIAGNOSIS — E039 Hypothyroidism, unspecified: Secondary | ICD-10-CM | POA: Diagnosis not present

## 2018-12-10 DIAGNOSIS — M1990 Unspecified osteoarthritis, unspecified site: Secondary | ICD-10-CM | POA: Diagnosis not present

## 2018-12-16 DIAGNOSIS — M1990 Unspecified osteoarthritis, unspecified site: Secondary | ICD-10-CM | POA: Diagnosis not present

## 2018-12-16 DIAGNOSIS — E7800 Pure hypercholesterolemia, unspecified: Secondary | ICD-10-CM | POA: Diagnosis not present

## 2018-12-16 DIAGNOSIS — N1831 Chronic kidney disease, stage 3a: Secondary | ICD-10-CM | POA: Diagnosis not present

## 2018-12-16 DIAGNOSIS — E039 Hypothyroidism, unspecified: Secondary | ICD-10-CM | POA: Diagnosis not present

## 2018-12-16 DIAGNOSIS — I129 Hypertensive chronic kidney disease with stage 1 through stage 4 chronic kidney disease, or unspecified chronic kidney disease: Secondary | ICD-10-CM | POA: Diagnosis not present

## 2018-12-16 DIAGNOSIS — I10 Essential (primary) hypertension: Secondary | ICD-10-CM | POA: Diagnosis not present

## 2019-01-01 DIAGNOSIS — H35033 Hypertensive retinopathy, bilateral: Secondary | ICD-10-CM | POA: Diagnosis not present

## 2019-01-01 DIAGNOSIS — Z961 Presence of intraocular lens: Secondary | ICD-10-CM | POA: Diagnosis not present

## 2019-01-01 DIAGNOSIS — H35373 Puckering of macula, bilateral: Secondary | ICD-10-CM | POA: Diagnosis not present

## 2019-01-01 DIAGNOSIS — H43812 Vitreous degeneration, left eye: Secondary | ICD-10-CM | POA: Diagnosis not present

## 2019-02-03 DIAGNOSIS — E039 Hypothyroidism, unspecified: Secondary | ICD-10-CM | POA: Diagnosis not present

## 2019-02-03 DIAGNOSIS — I10 Essential (primary) hypertension: Secondary | ICD-10-CM | POA: Diagnosis not present

## 2019-02-03 DIAGNOSIS — E7800 Pure hypercholesterolemia, unspecified: Secondary | ICD-10-CM | POA: Diagnosis not present

## 2019-02-03 DIAGNOSIS — N1831 Chronic kidney disease, stage 3a: Secondary | ICD-10-CM | POA: Diagnosis not present

## 2019-02-03 DIAGNOSIS — M1990 Unspecified osteoarthritis, unspecified site: Secondary | ICD-10-CM | POA: Diagnosis not present

## 2019-02-03 DIAGNOSIS — I129 Hypertensive chronic kidney disease with stage 1 through stage 4 chronic kidney disease, or unspecified chronic kidney disease: Secondary | ICD-10-CM | POA: Diagnosis not present

## 2019-02-16 DIAGNOSIS — I10 Essential (primary) hypertension: Secondary | ICD-10-CM | POA: Diagnosis not present

## 2019-02-16 DIAGNOSIS — I129 Hypertensive chronic kidney disease with stage 1 through stage 4 chronic kidney disease, or unspecified chronic kidney disease: Secondary | ICD-10-CM | POA: Diagnosis not present

## 2019-02-16 DIAGNOSIS — E7800 Pure hypercholesterolemia, unspecified: Secondary | ICD-10-CM | POA: Diagnosis not present

## 2019-02-16 DIAGNOSIS — N1831 Chronic kidney disease, stage 3a: Secondary | ICD-10-CM | POA: Diagnosis not present

## 2019-02-16 DIAGNOSIS — M1990 Unspecified osteoarthritis, unspecified site: Secondary | ICD-10-CM | POA: Diagnosis not present

## 2019-02-16 DIAGNOSIS — E039 Hypothyroidism, unspecified: Secondary | ICD-10-CM | POA: Diagnosis not present

## 2019-03-23 DIAGNOSIS — I10 Essential (primary) hypertension: Secondary | ICD-10-CM | POA: Diagnosis not present

## 2019-03-23 DIAGNOSIS — M1990 Unspecified osteoarthritis, unspecified site: Secondary | ICD-10-CM | POA: Diagnosis not present

## 2019-03-23 DIAGNOSIS — I129 Hypertensive chronic kidney disease with stage 1 through stage 4 chronic kidney disease, or unspecified chronic kidney disease: Secondary | ICD-10-CM | POA: Diagnosis not present

## 2019-03-23 DIAGNOSIS — E039 Hypothyroidism, unspecified: Secondary | ICD-10-CM | POA: Diagnosis not present

## 2019-03-23 DIAGNOSIS — N183 Chronic kidney disease, stage 3 unspecified: Secondary | ICD-10-CM | POA: Diagnosis not present

## 2019-03-23 DIAGNOSIS — E7800 Pure hypercholesterolemia, unspecified: Secondary | ICD-10-CM | POA: Diagnosis not present

## 2019-03-31 ENCOUNTER — Ambulatory Visit: Payer: PPO

## 2019-04-21 DIAGNOSIS — I129 Hypertensive chronic kidney disease with stage 1 through stage 4 chronic kidney disease, or unspecified chronic kidney disease: Secondary | ICD-10-CM | POA: Diagnosis not present

## 2019-04-21 DIAGNOSIS — E039 Hypothyroidism, unspecified: Secondary | ICD-10-CM | POA: Diagnosis not present

## 2019-04-21 DIAGNOSIS — N183 Chronic kidney disease, stage 3 unspecified: Secondary | ICD-10-CM | POA: Diagnosis not present

## 2019-04-21 DIAGNOSIS — M1990 Unspecified osteoarthritis, unspecified site: Secondary | ICD-10-CM | POA: Diagnosis not present

## 2019-04-21 DIAGNOSIS — E7800 Pure hypercholesterolemia, unspecified: Secondary | ICD-10-CM | POA: Diagnosis not present

## 2019-04-21 DIAGNOSIS — I10 Essential (primary) hypertension: Secondary | ICD-10-CM | POA: Diagnosis not present

## 2019-05-15 DIAGNOSIS — E7800 Pure hypercholesterolemia, unspecified: Secondary | ICD-10-CM | POA: Diagnosis not present

## 2019-05-15 DIAGNOSIS — N1832 Chronic kidney disease, stage 3b: Secondary | ICD-10-CM | POA: Diagnosis not present

## 2019-05-15 DIAGNOSIS — E039 Hypothyroidism, unspecified: Secondary | ICD-10-CM | POA: Diagnosis not present

## 2019-05-15 DIAGNOSIS — I129 Hypertensive chronic kidney disease with stage 1 through stage 4 chronic kidney disease, or unspecified chronic kidney disease: Secondary | ICD-10-CM | POA: Diagnosis not present

## 2019-06-05 DIAGNOSIS — K219 Gastro-esophageal reflux disease without esophagitis: Secondary | ICD-10-CM | POA: Diagnosis not present

## 2019-06-05 DIAGNOSIS — N3946 Mixed incontinence: Secondary | ICD-10-CM | POA: Diagnosis not present

## 2019-06-05 DIAGNOSIS — I700 Atherosclerosis of aorta: Secondary | ICD-10-CM | POA: Diagnosis not present

## 2019-06-05 DIAGNOSIS — Z0000 Encounter for general adult medical examination without abnormal findings: Secondary | ICD-10-CM | POA: Diagnosis not present

## 2019-06-05 DIAGNOSIS — N1832 Chronic kidney disease, stage 3b: Secondary | ICD-10-CM | POA: Diagnosis not present

## 2019-06-05 DIAGNOSIS — E7800 Pure hypercholesterolemia, unspecified: Secondary | ICD-10-CM | POA: Diagnosis not present

## 2019-06-05 DIAGNOSIS — I872 Venous insufficiency (chronic) (peripheral): Secondary | ICD-10-CM | POA: Diagnosis not present

## 2019-06-05 DIAGNOSIS — Z79899 Other long term (current) drug therapy: Secondary | ICD-10-CM | POA: Diagnosis not present

## 2019-06-05 DIAGNOSIS — I129 Hypertensive chronic kidney disease with stage 1 through stage 4 chronic kidney disease, or unspecified chronic kidney disease: Secondary | ICD-10-CM | POA: Diagnosis not present

## 2019-06-05 DIAGNOSIS — Z1389 Encounter for screening for other disorder: Secondary | ICD-10-CM | POA: Diagnosis not present

## 2019-06-05 DIAGNOSIS — E039 Hypothyroidism, unspecified: Secondary | ICD-10-CM | POA: Diagnosis not present

## 2019-06-05 DIAGNOSIS — K9089 Other intestinal malabsorption: Secondary | ICD-10-CM | POA: Diagnosis not present

## 2019-07-02 DIAGNOSIS — N1832 Chronic kidney disease, stage 3b: Secondary | ICD-10-CM | POA: Diagnosis not present

## 2019-07-02 DIAGNOSIS — I129 Hypertensive chronic kidney disease with stage 1 through stage 4 chronic kidney disease, or unspecified chronic kidney disease: Secondary | ICD-10-CM | POA: Diagnosis not present

## 2019-07-02 DIAGNOSIS — E039 Hypothyroidism, unspecified: Secondary | ICD-10-CM | POA: Diagnosis not present

## 2019-07-02 DIAGNOSIS — E7800 Pure hypercholesterolemia, unspecified: Secondary | ICD-10-CM | POA: Diagnosis not present

## 2019-07-17 DIAGNOSIS — E7800 Pure hypercholesterolemia, unspecified: Secondary | ICD-10-CM | POA: Diagnosis not present

## 2019-07-17 DIAGNOSIS — E039 Hypothyroidism, unspecified: Secondary | ICD-10-CM | POA: Diagnosis not present

## 2019-07-17 DIAGNOSIS — N1832 Chronic kidney disease, stage 3b: Secondary | ICD-10-CM | POA: Diagnosis not present

## 2019-07-17 DIAGNOSIS — I129 Hypertensive chronic kidney disease with stage 1 through stage 4 chronic kidney disease, or unspecified chronic kidney disease: Secondary | ICD-10-CM | POA: Diagnosis not present

## 2019-08-07 DIAGNOSIS — N1832 Chronic kidney disease, stage 3b: Secondary | ICD-10-CM | POA: Diagnosis not present

## 2019-08-07 DIAGNOSIS — I129 Hypertensive chronic kidney disease with stage 1 through stage 4 chronic kidney disease, or unspecified chronic kidney disease: Secondary | ICD-10-CM | POA: Diagnosis not present

## 2019-08-18 DIAGNOSIS — E7800 Pure hypercholesterolemia, unspecified: Secondary | ICD-10-CM | POA: Diagnosis not present

## 2019-08-18 DIAGNOSIS — E039 Hypothyroidism, unspecified: Secondary | ICD-10-CM | POA: Diagnosis not present

## 2019-08-18 DIAGNOSIS — N1832 Chronic kidney disease, stage 3b: Secondary | ICD-10-CM | POA: Diagnosis not present

## 2019-08-18 DIAGNOSIS — I129 Hypertensive chronic kidney disease with stage 1 through stage 4 chronic kidney disease, or unspecified chronic kidney disease: Secondary | ICD-10-CM | POA: Diagnosis not present

## 2019-08-19 DIAGNOSIS — C44319 Basal cell carcinoma of skin of other parts of face: Secondary | ICD-10-CM | POA: Diagnosis not present

## 2019-08-19 DIAGNOSIS — Z85828 Personal history of other malignant neoplasm of skin: Secondary | ICD-10-CM | POA: Diagnosis not present

## 2019-09-16 DIAGNOSIS — C44319 Basal cell carcinoma of skin of other parts of face: Secondary | ICD-10-CM | POA: Diagnosis not present

## 2019-09-16 DIAGNOSIS — Z85828 Personal history of other malignant neoplasm of skin: Secondary | ICD-10-CM | POA: Diagnosis not present

## 2019-09-21 DIAGNOSIS — E039 Hypothyroidism, unspecified: Secondary | ICD-10-CM | POA: Diagnosis not present

## 2019-09-21 DIAGNOSIS — N1832 Chronic kidney disease, stage 3b: Secondary | ICD-10-CM | POA: Diagnosis not present

## 2019-09-21 DIAGNOSIS — E7800 Pure hypercholesterolemia, unspecified: Secondary | ICD-10-CM | POA: Diagnosis not present

## 2019-09-21 DIAGNOSIS — I129 Hypertensive chronic kidney disease with stage 1 through stage 4 chronic kidney disease, or unspecified chronic kidney disease: Secondary | ICD-10-CM | POA: Diagnosis not present

## 2019-10-21 DIAGNOSIS — E039 Hypothyroidism, unspecified: Secondary | ICD-10-CM | POA: Diagnosis not present

## 2019-10-21 DIAGNOSIS — E7800 Pure hypercholesterolemia, unspecified: Secondary | ICD-10-CM | POA: Diagnosis not present

## 2019-10-21 DIAGNOSIS — I129 Hypertensive chronic kidney disease with stage 1 through stage 4 chronic kidney disease, or unspecified chronic kidney disease: Secondary | ICD-10-CM | POA: Diagnosis not present

## 2019-10-21 DIAGNOSIS — N1832 Chronic kidney disease, stage 3b: Secondary | ICD-10-CM | POA: Diagnosis not present

## 2019-11-02 DIAGNOSIS — I129 Hypertensive chronic kidney disease with stage 1 through stage 4 chronic kidney disease, or unspecified chronic kidney disease: Secondary | ICD-10-CM | POA: Diagnosis not present

## 2019-11-02 DIAGNOSIS — N1832 Chronic kidney disease, stage 3b: Secondary | ICD-10-CM | POA: Diagnosis not present

## 2019-11-02 DIAGNOSIS — R413 Other amnesia: Secondary | ICD-10-CM | POA: Diagnosis not present

## 2019-12-09 DIAGNOSIS — N1832 Chronic kidney disease, stage 3b: Secondary | ICD-10-CM | POA: Diagnosis not present

## 2019-12-09 DIAGNOSIS — E7800 Pure hypercholesterolemia, unspecified: Secondary | ICD-10-CM | POA: Diagnosis not present

## 2019-12-09 DIAGNOSIS — Z23 Encounter for immunization: Secondary | ICD-10-CM | POA: Diagnosis not present

## 2019-12-09 DIAGNOSIS — E039 Hypothyroidism, unspecified: Secondary | ICD-10-CM | POA: Diagnosis not present

## 2019-12-09 DIAGNOSIS — I129 Hypertensive chronic kidney disease with stage 1 through stage 4 chronic kidney disease, or unspecified chronic kidney disease: Secondary | ICD-10-CM | POA: Diagnosis not present

## 2019-12-31 DIAGNOSIS — I129 Hypertensive chronic kidney disease with stage 1 through stage 4 chronic kidney disease, or unspecified chronic kidney disease: Secondary | ICD-10-CM | POA: Diagnosis not present

## 2019-12-31 DIAGNOSIS — N1832 Chronic kidney disease, stage 3b: Secondary | ICD-10-CM | POA: Diagnosis not present

## 2019-12-31 DIAGNOSIS — E7800 Pure hypercholesterolemia, unspecified: Secondary | ICD-10-CM | POA: Diagnosis not present

## 2019-12-31 DIAGNOSIS — E039 Hypothyroidism, unspecified: Secondary | ICD-10-CM | POA: Diagnosis not present

## 2020-01-13 ENCOUNTER — Other Ambulatory Visit: Payer: Self-pay | Admitting: Geriatric Medicine

## 2020-01-13 DIAGNOSIS — H35033 Hypertensive retinopathy, bilateral: Secondary | ICD-10-CM | POA: Diagnosis not present

## 2020-01-13 DIAGNOSIS — E7800 Pure hypercholesterolemia, unspecified: Secondary | ICD-10-CM | POA: Diagnosis not present

## 2020-01-13 DIAGNOSIS — R911 Solitary pulmonary nodule: Secondary | ICD-10-CM

## 2020-01-13 DIAGNOSIS — I129 Hypertensive chronic kidney disease with stage 1 through stage 4 chronic kidney disease, or unspecified chronic kidney disease: Secondary | ICD-10-CM | POA: Diagnosis not present

## 2020-01-13 DIAGNOSIS — N1832 Chronic kidney disease, stage 3b: Secondary | ICD-10-CM | POA: Diagnosis not present

## 2020-01-13 DIAGNOSIS — E039 Hypothyroidism, unspecified: Secondary | ICD-10-CM | POA: Diagnosis not present

## 2020-01-13 DIAGNOSIS — K219 Gastro-esophageal reflux disease without esophagitis: Secondary | ICD-10-CM | POA: Diagnosis not present

## 2020-02-02 ENCOUNTER — Ambulatory Visit
Admission: RE | Admit: 2020-02-02 | Discharge: 2020-02-02 | Disposition: A | Discharge status: Home / Self Care | Payer: PPO | Source: Ambulatory Visit | Attending: Geriatric Medicine | Admitting: Geriatric Medicine

## 2020-02-02 DIAGNOSIS — R911 Solitary pulmonary nodule: Secondary | ICD-10-CM

## 2020-02-02 DIAGNOSIS — I700 Atherosclerosis of aorta: Secondary | ICD-10-CM | POA: Diagnosis not present

## 2020-02-02 DIAGNOSIS — I2510 Atherosclerotic heart disease of native coronary artery without angina pectoris: Secondary | ICD-10-CM | POA: Diagnosis not present

## 2020-02-02 DIAGNOSIS — J984 Other disorders of lung: Secondary | ICD-10-CM | POA: Diagnosis not present

## 2020-02-10 DIAGNOSIS — N1832 Chronic kidney disease, stage 3b: Secondary | ICD-10-CM | POA: Diagnosis not present

## 2020-02-10 DIAGNOSIS — I129 Hypertensive chronic kidney disease with stage 1 through stage 4 chronic kidney disease, or unspecified chronic kidney disease: Secondary | ICD-10-CM | POA: Diagnosis not present

## 2020-02-10 DIAGNOSIS — I700 Atherosclerosis of aorta: Secondary | ICD-10-CM | POA: Diagnosis not present

## 2020-02-11 DIAGNOSIS — H04123 Dry eye syndrome of bilateral lacrimal glands: Secondary | ICD-10-CM | POA: Diagnosis not present

## 2020-02-11 DIAGNOSIS — H35373 Puckering of macula, bilateral: Secondary | ICD-10-CM | POA: Diagnosis not present

## 2020-02-11 DIAGNOSIS — H35033 Hypertensive retinopathy, bilateral: Secondary | ICD-10-CM | POA: Diagnosis not present

## 2020-02-11 DIAGNOSIS — H43812 Vitreous degeneration, left eye: Secondary | ICD-10-CM | POA: Diagnosis not present

## 2020-02-12 DIAGNOSIS — K219 Gastro-esophageal reflux disease without esophagitis: Secondary | ICD-10-CM | POA: Diagnosis not present

## 2020-02-12 DIAGNOSIS — I129 Hypertensive chronic kidney disease with stage 1 through stage 4 chronic kidney disease, or unspecified chronic kidney disease: Secondary | ICD-10-CM | POA: Diagnosis not present

## 2020-02-12 DIAGNOSIS — N1832 Chronic kidney disease, stage 3b: Secondary | ICD-10-CM | POA: Diagnosis not present

## 2020-02-12 DIAGNOSIS — E7800 Pure hypercholesterolemia, unspecified: Secondary | ICD-10-CM | POA: Diagnosis not present

## 2020-02-12 DIAGNOSIS — H35033 Hypertensive retinopathy, bilateral: Secondary | ICD-10-CM | POA: Diagnosis not present

## 2020-02-12 DIAGNOSIS — E039 Hypothyroidism, unspecified: Secondary | ICD-10-CM | POA: Diagnosis not present

## 2020-04-11 DIAGNOSIS — E7800 Pure hypercholesterolemia, unspecified: Secondary | ICD-10-CM | POA: Diagnosis not present

## 2020-04-11 DIAGNOSIS — I129 Hypertensive chronic kidney disease with stage 1 through stage 4 chronic kidney disease, or unspecified chronic kidney disease: Secondary | ICD-10-CM | POA: Diagnosis not present

## 2020-04-11 DIAGNOSIS — E039 Hypothyroidism, unspecified: Secondary | ICD-10-CM | POA: Diagnosis not present

## 2020-04-11 DIAGNOSIS — K219 Gastro-esophageal reflux disease without esophagitis: Secondary | ICD-10-CM | POA: Diagnosis not present

## 2020-04-11 DIAGNOSIS — N1832 Chronic kidney disease, stage 3b: Secondary | ICD-10-CM | POA: Diagnosis not present

## 2020-04-11 DIAGNOSIS — H35033 Hypertensive retinopathy, bilateral: Secondary | ICD-10-CM | POA: Diagnosis not present

## 2020-04-14 DIAGNOSIS — H35033 Hypertensive retinopathy, bilateral: Secondary | ICD-10-CM | POA: Diagnosis not present

## 2020-04-14 DIAGNOSIS — K219 Gastro-esophageal reflux disease without esophagitis: Secondary | ICD-10-CM | POA: Diagnosis not present

## 2020-04-14 DIAGNOSIS — E7800 Pure hypercholesterolemia, unspecified: Secondary | ICD-10-CM | POA: Diagnosis not present

## 2020-04-14 DIAGNOSIS — N1832 Chronic kidney disease, stage 3b: Secondary | ICD-10-CM | POA: Diagnosis not present

## 2020-04-14 DIAGNOSIS — E039 Hypothyroidism, unspecified: Secondary | ICD-10-CM | POA: Diagnosis not present

## 2020-04-14 DIAGNOSIS — I129 Hypertensive chronic kidney disease with stage 1 through stage 4 chronic kidney disease, or unspecified chronic kidney disease: Secondary | ICD-10-CM | POA: Diagnosis not present

## 2020-05-10 DIAGNOSIS — K219 Gastro-esophageal reflux disease without esophagitis: Secondary | ICD-10-CM | POA: Diagnosis not present

## 2020-05-10 DIAGNOSIS — E7800 Pure hypercholesterolemia, unspecified: Secondary | ICD-10-CM | POA: Diagnosis not present

## 2020-05-10 DIAGNOSIS — N1832 Chronic kidney disease, stage 3b: Secondary | ICD-10-CM | POA: Diagnosis not present

## 2020-05-10 DIAGNOSIS — E039 Hypothyroidism, unspecified: Secondary | ICD-10-CM | POA: Diagnosis not present

## 2020-05-10 DIAGNOSIS — H35033 Hypertensive retinopathy, bilateral: Secondary | ICD-10-CM | POA: Diagnosis not present

## 2020-05-10 DIAGNOSIS — N2581 Secondary hyperparathyroidism of renal origin: Secondary | ICD-10-CM | POA: Diagnosis not present

## 2020-05-10 DIAGNOSIS — I129 Hypertensive chronic kidney disease with stage 1 through stage 4 chronic kidney disease, or unspecified chronic kidney disease: Secondary | ICD-10-CM | POA: Diagnosis not present

## 2020-06-14 DIAGNOSIS — Z79899 Other long term (current) drug therapy: Secondary | ICD-10-CM | POA: Diagnosis not present

## 2020-06-14 DIAGNOSIS — I700 Atherosclerosis of aorta: Secondary | ICD-10-CM | POA: Diagnosis not present

## 2020-06-14 DIAGNOSIS — E039 Hypothyroidism, unspecified: Secondary | ICD-10-CM | POA: Diagnosis not present

## 2020-06-14 DIAGNOSIS — E7800 Pure hypercholesterolemia, unspecified: Secondary | ICD-10-CM | POA: Diagnosis not present

## 2020-06-14 DIAGNOSIS — N2581 Secondary hyperparathyroidism of renal origin: Secondary | ICD-10-CM | POA: Diagnosis not present

## 2020-06-14 DIAGNOSIS — Z0000 Encounter for general adult medical examination without abnormal findings: Secondary | ICD-10-CM | POA: Diagnosis not present

## 2020-06-14 DIAGNOSIS — N1832 Chronic kidney disease, stage 3b: Secondary | ICD-10-CM | POA: Diagnosis not present

## 2020-06-14 DIAGNOSIS — K9089 Other intestinal malabsorption: Secondary | ICD-10-CM | POA: Diagnosis not present

## 2020-06-14 DIAGNOSIS — K219 Gastro-esophageal reflux disease without esophagitis: Secondary | ICD-10-CM | POA: Diagnosis not present

## 2020-06-14 DIAGNOSIS — Z1389 Encounter for screening for other disorder: Secondary | ICD-10-CM | POA: Diagnosis not present

## 2020-06-14 DIAGNOSIS — H35033 Hypertensive retinopathy, bilateral: Secondary | ICD-10-CM | POA: Diagnosis not present

## 2020-06-14 DIAGNOSIS — I129 Hypertensive chronic kidney disease with stage 1 through stage 4 chronic kidney disease, or unspecified chronic kidney disease: Secondary | ICD-10-CM | POA: Diagnosis not present

## 2020-07-01 DIAGNOSIS — I129 Hypertensive chronic kidney disease with stage 1 through stage 4 chronic kidney disease, or unspecified chronic kidney disease: Secondary | ICD-10-CM | POA: Diagnosis not present

## 2020-07-01 DIAGNOSIS — N1832 Chronic kidney disease, stage 3b: Secondary | ICD-10-CM | POA: Diagnosis not present

## 2020-08-09 DIAGNOSIS — E7800 Pure hypercholesterolemia, unspecified: Secondary | ICD-10-CM | POA: Diagnosis not present

## 2020-08-09 DIAGNOSIS — E039 Hypothyroidism, unspecified: Secondary | ICD-10-CM | POA: Diagnosis not present

## 2020-08-09 DIAGNOSIS — N2581 Secondary hyperparathyroidism of renal origin: Secondary | ICD-10-CM | POA: Diagnosis not present

## 2020-08-09 DIAGNOSIS — H35033 Hypertensive retinopathy, bilateral: Secondary | ICD-10-CM | POA: Diagnosis not present

## 2020-08-09 DIAGNOSIS — I129 Hypertensive chronic kidney disease with stage 1 through stage 4 chronic kidney disease, or unspecified chronic kidney disease: Secondary | ICD-10-CM | POA: Diagnosis not present

## 2020-08-09 DIAGNOSIS — K219 Gastro-esophageal reflux disease without esophagitis: Secondary | ICD-10-CM | POA: Diagnosis not present

## 2020-08-09 DIAGNOSIS — N1832 Chronic kidney disease, stage 3b: Secondary | ICD-10-CM | POA: Diagnosis not present

## 2020-08-19 DIAGNOSIS — E7800 Pure hypercholesterolemia, unspecified: Secondary | ICD-10-CM | POA: Diagnosis not present

## 2020-08-19 DIAGNOSIS — K219 Gastro-esophageal reflux disease without esophagitis: Secondary | ICD-10-CM | POA: Diagnosis not present

## 2020-08-19 DIAGNOSIS — N1832 Chronic kidney disease, stage 3b: Secondary | ICD-10-CM | POA: Diagnosis not present

## 2020-08-19 DIAGNOSIS — E039 Hypothyroidism, unspecified: Secondary | ICD-10-CM | POA: Diagnosis not present

## 2020-08-19 DIAGNOSIS — I129 Hypertensive chronic kidney disease with stage 1 through stage 4 chronic kidney disease, or unspecified chronic kidney disease: Secondary | ICD-10-CM | POA: Diagnosis not present

## 2020-08-19 DIAGNOSIS — N2581 Secondary hyperparathyroidism of renal origin: Secondary | ICD-10-CM | POA: Diagnosis not present

## 2020-08-19 DIAGNOSIS — H35033 Hypertensive retinopathy, bilateral: Secondary | ICD-10-CM | POA: Diagnosis not present

## 2020-08-25 IMAGING — CT CT ANGIO CHEST-ABD-PELV FOR DISSECTION W/ AND WO/W CM
2 of 7 series · 13 of 46 positions shown, 15 images · IV contrast (OMNI 350)
Comparison: Chest x-ray July 12, 2018

CLINICAL DATA: Sudden onset of epigastric pain radiating into back
after gardening.

EXAM:
CT ANGIOGRAPHY CHEST, ABDOMEN AND PELVIS
TECHNIQUE: Multidetector CT imaging through the chest, abdomen and pelvis was
performed using the standard protocol during bolus administration of
intravenous contrast. Multiplanar reconstructed images and MIPs were
obtained and reviewed to evaluate the vascular anatomy.
CONTRAST:  80mL OMNIPAQUE IOHEXOL 350 MG/ML SOLN

[Series 7: dissection 2mm · axial · 0.69mm/px · z∈[+804,+1304]mm · 10 of 282 slices shown, 12 images]
[im 16/282  soft-tissue]
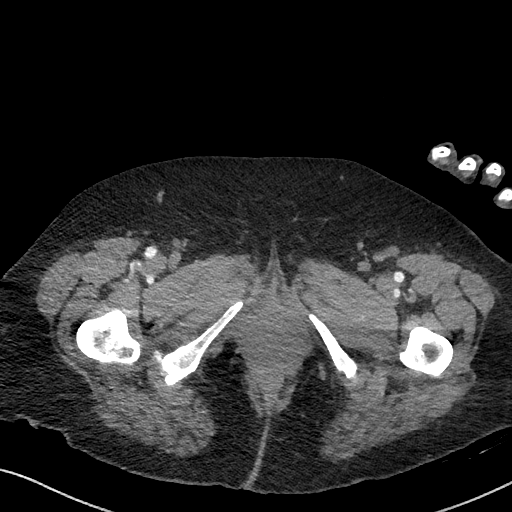
[im 16/282  bone]
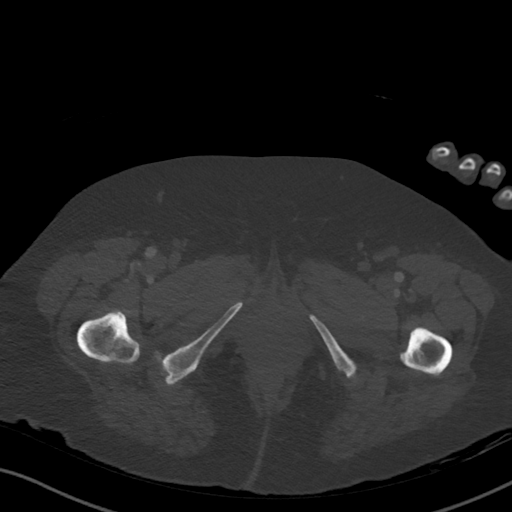
[im 47/282  soft-tissue]
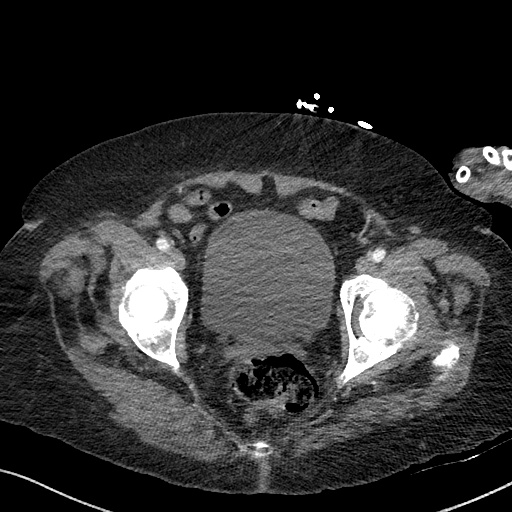
[im 79/282  soft-tissue]
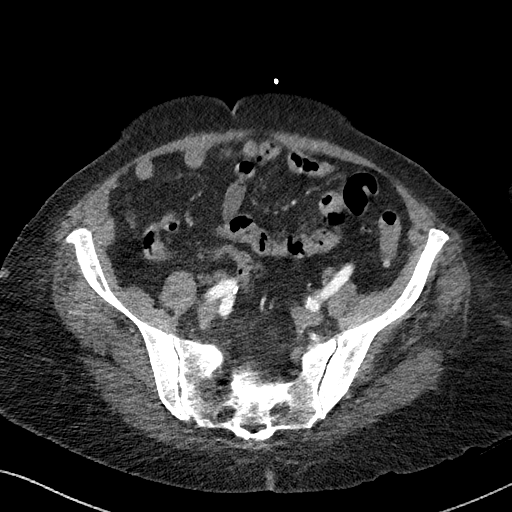
[im 94/282  soft-tissue]
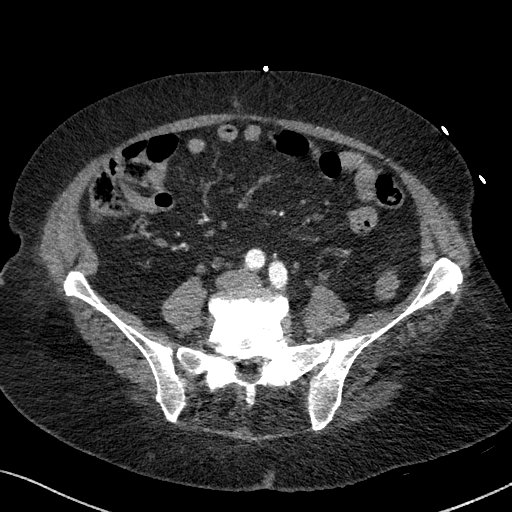
[im 125/282  soft-tissue]
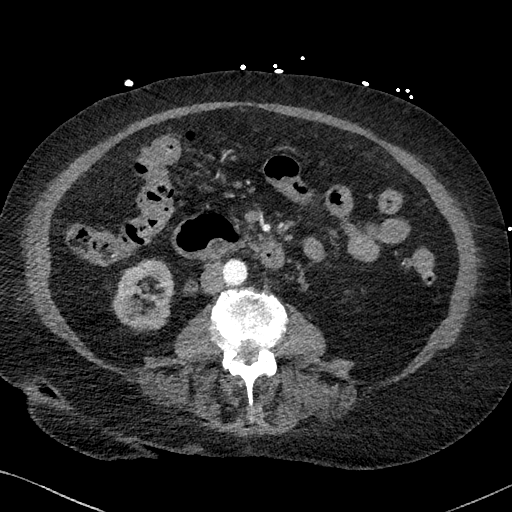
[im 157/282  soft-tissue]
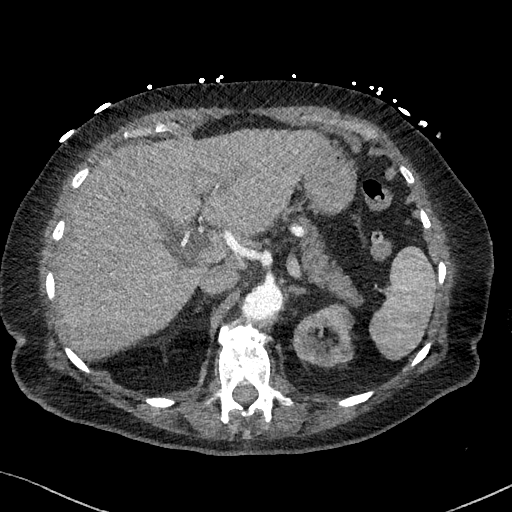
[im 188/282  soft-tissue]
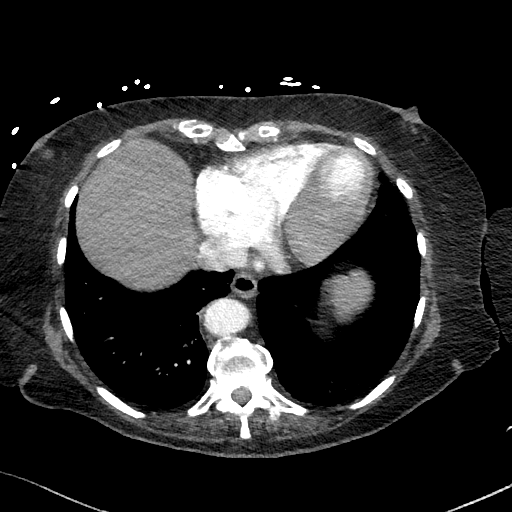
[im 203/282  soft-tissue]
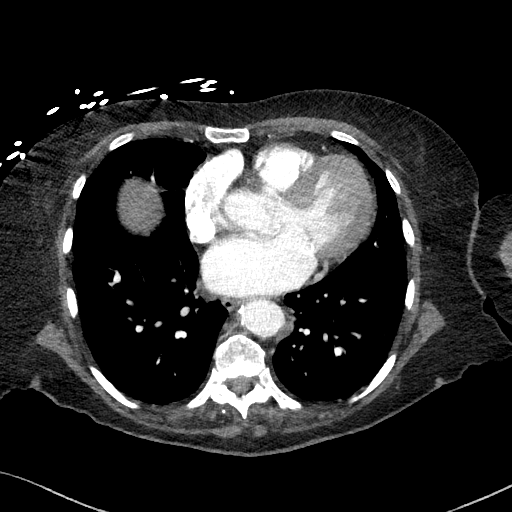
[im 235/282  soft-tissue]
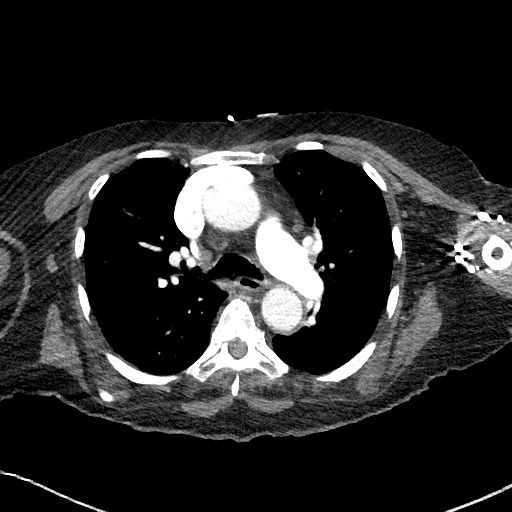
[im 235/282  bone]
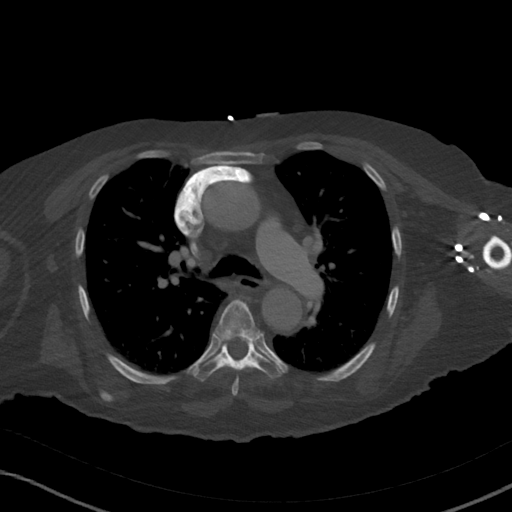
[im 266/282  soft-tissue]
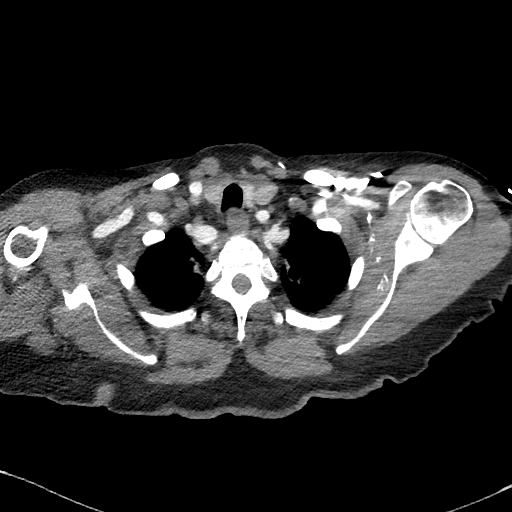

[Series 10: dissection 2mm cor · coronal · 0.72mm/px · 3 of 139 slices shown]
[im 35/139  soft-tissue]
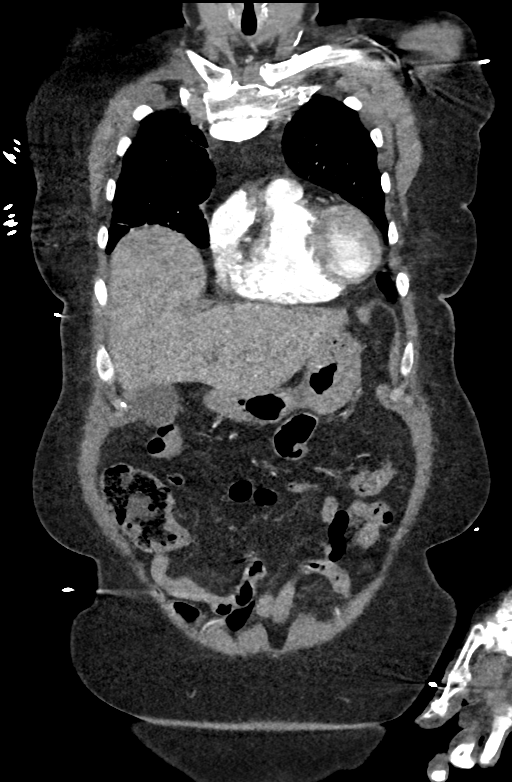
[im 70/139  soft-tissue]
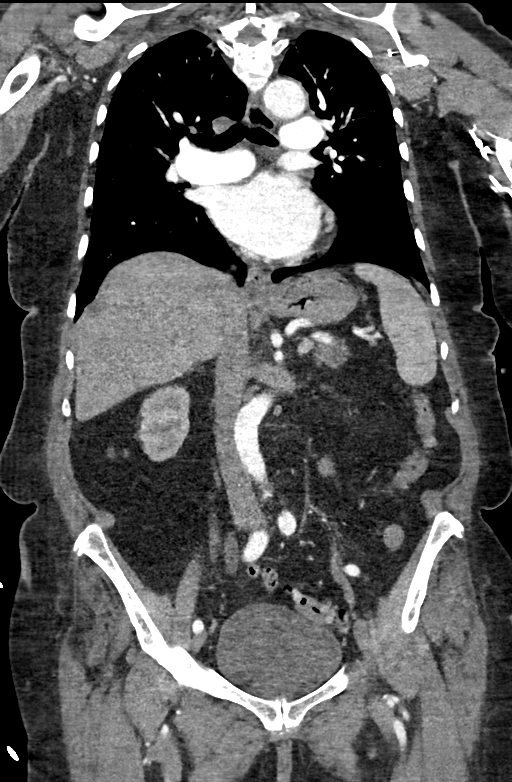
[im 104/139  soft-tissue]
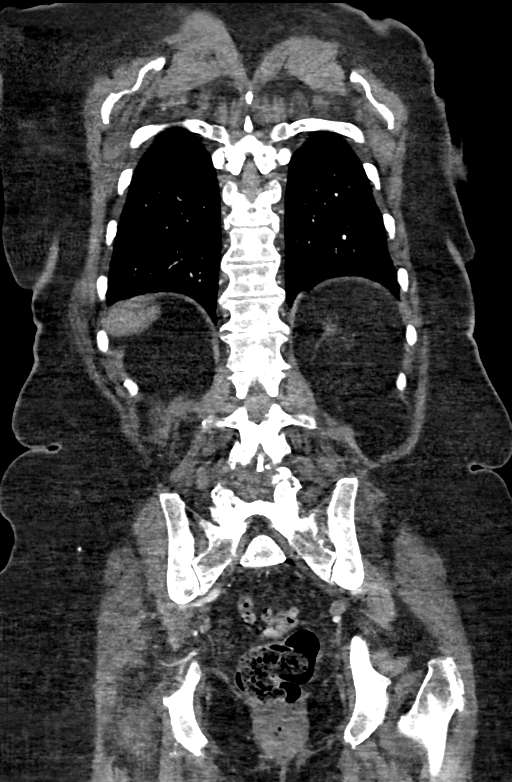

[13 of 46 positions shown; findings below may reference images not displayed]

FINDINGS: CTA CHEST FINDINGS

Cardiovascular: Mild cardiomegaly. Coronary artery calcifications
involve the LAD and circumflex coronary arteries. Visualized
pulmonary arteries are normal in appearance with no filling defects
or pulmonary emboli noted. Mild scattered atherosclerotic changes
are seen in the thoracic aorta. No aneurysm or dissection in the
thoracic aorta.

Mediastinum/Nodes: No enlarged mediastinal, hilar, or axillary lymph
nodes. Thyroid gland, trachea, and esophagus demonstrate no
significant findings.

Lungs/Pleura: Mild scarring in the medial apices bilaterally. No
pneumothorax. Central airways are normal. There is a nodule in the
lateral left lung base measuring 7 mm in mean diameter. No other
nodules. No masses or infiltrates.

Musculoskeletal: See below.

Review of the MIP images confirms the above findings.

CTA ABDOMEN AND PELVIS FINDINGS

VASCULAR

Aorta: The abdominal aorta is tortuous without aneurysm or
dissection.

Celiac: Mild atherosclerotic changes are seen near the origin of the
celiac artery with no narrowing.

SMA: Patent without evidence of aneurysm, dissection, vasculitis or
significant stenosis.

Renals: Both renal arteries are patent without evidence of aneurysm,
dissection, vasculitis, fibromuscular dysplasia or significant
stenosis.

IMA: Patent without evidence of aneurysm, dissection, vasculitis or
significant stenosis.

Inflow: Patent without evidence of aneurysm, dissection, vasculitis
or significant stenosis.

Veins: No obvious venous abnormality within the limitations of this
arterial phase study.

The iliac and proximal femoral arteries are normal in appearance
with mild scattered atherosclerotic changes.

Review of the MIP images confirms the above findings.

NON-VASCULAR

Hepatobiliary: Evaluation is limited due to arterial phase imaging.
No liver masses are identified. The portal vein is not well assessed
due to timing of contrast. The gallbladder is distended. A rounded
region of high attenuation in the gallbladder is likely a stone or
tumefactive sludge measuring up to 3.3 cm. No wall thickening or
pericholecystic fluid noted.

Pancreas: There is fatty infiltration of the pancreatic head of no
acute significance. No suspicious masses identified or evidence of
pancreatitis.

Spleen: Normal in size without focal abnormality.

Adrenals/Urinary Tract: Adrenal glands are normal. Bilateral renal
cysts are identified. No hydronephrosis or acute perinephric
stranding. No ureteral stones. The bladder is normal.

Stomach/Bowel: The stomach is normal. A duodenal diverticulum is
identified off the second portion of the duodenum without evidence
of inflammation, of no acute significance. The small bowel is
otherwise normal. Colonic diverticulosis is seen without
diverticulitis. The appendix is normal.

Lymphatic: No significant vascular findings are present. No enlarged
abdominal or pelvic lymph nodes.

Reproductive: Status post hysterectomy. No adnexal masses.

Other: No abdominal wall hernia or abnormality. No abdominopelvic
ascites.

Musculoskeletal: L4 pars defects are noted with grade 2
anterolisthesis of L4 versus L5. No other malalignment. Severe
degenerative changes in the lumbar spine with more mild degenerative
changes in the thoracic spine. Lower lumbar facet degenerative
changes identified. Other bones are unremarkable.

Review of the MIP images confirms the above findings.
IMPRESSION: 1. No aortic aneurysm or dissection.
2. The gallbladder is somewhat distended. A rounded region of high
attenuation in the gallbladder measuring 3.3 cm is likely a stone or
tumefactive sludge. No wall thickening or pericholecystic fluid. If
there is clinical concern for acute cholecystitis, recommend
ultrasound.
3. Significant degenerative changes in the lumbar spine. Scattered
degenerative changes seen in the thoracic spine as well.
4. 7 mm nodule in the left lower lobe. Non-contrast chest CT at 6-12
months is recommended. If the nodule is stable at time of repeat CT,
then future CT at 18-24 months (from today's scan) is considered
optional for low-risk patients, but is recommended for high-risk
patients. This recommendation follows the consensus statement:
Guidelines for Management of Incidental Pulmonary Nodules Detected
[DATE].
5. Coronary artery calcifications in the LAD and circumflex
arteries.
6. Colonic diverticulosis without diverticulitis.

## 2020-08-25 IMAGING — US ULTRASOUND ABDOMEN COMPLETE
1 series · 14 of 25 positions shown · non-contrast
Comparison: None.

CLINICAL DATA: Abdominal pain since 2 p.m. today.

EXAM:
ABDOMEN ULTRASOUND COMPLETE

[Series 1: ultrasound abdomen complete · 14 of 78 slices shown]
[im 1/78]
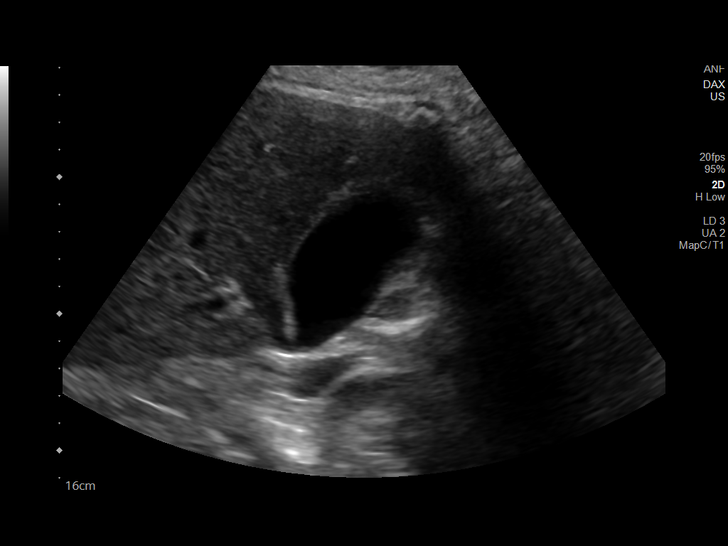
[im 7/78]
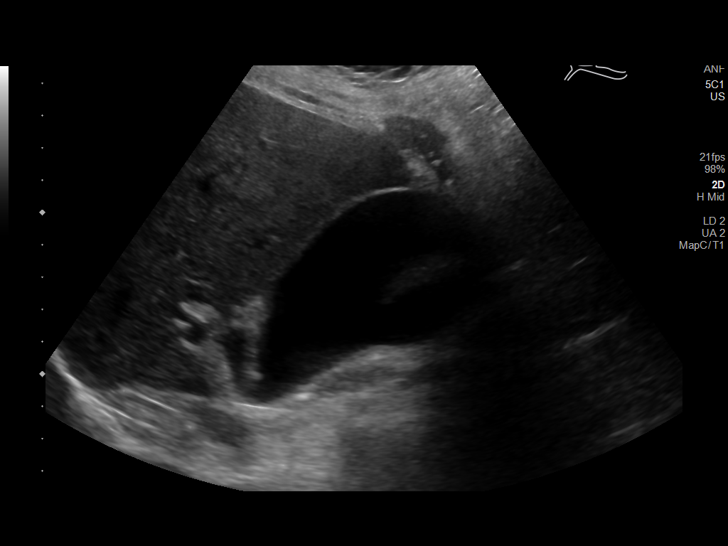
[im 13/78]
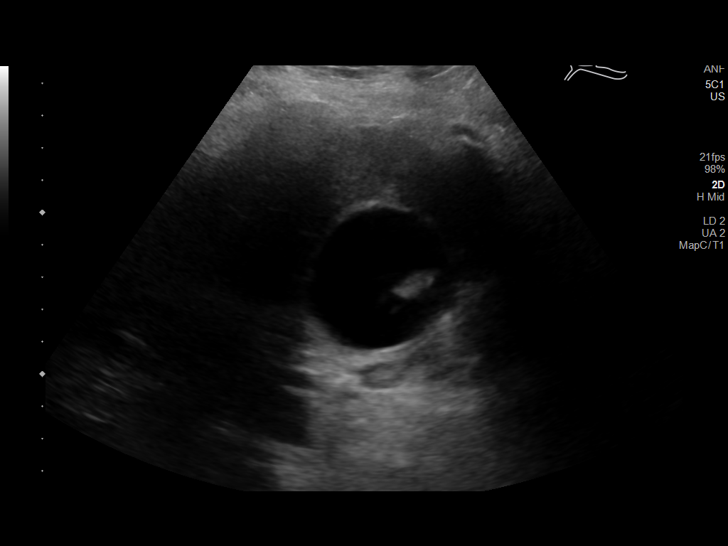
[im 20/78]
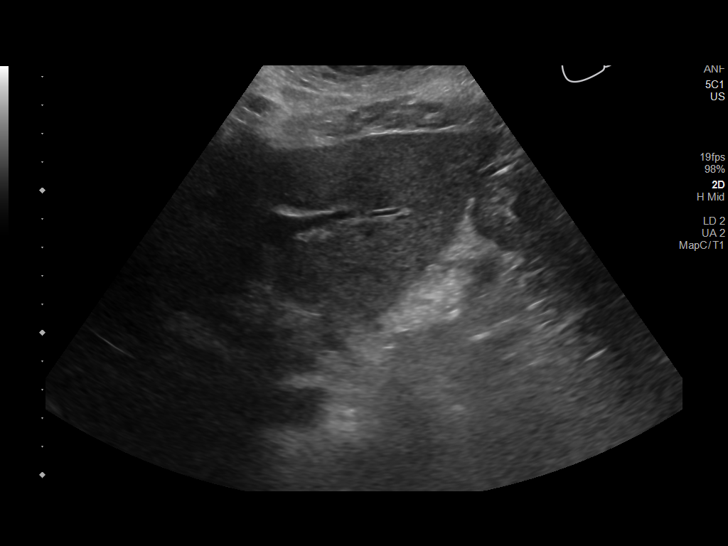
[im 26/78]
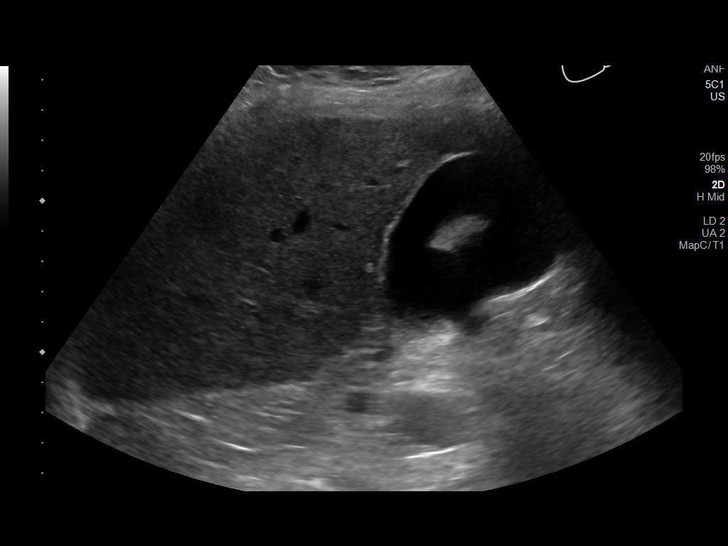
[im 29/78]
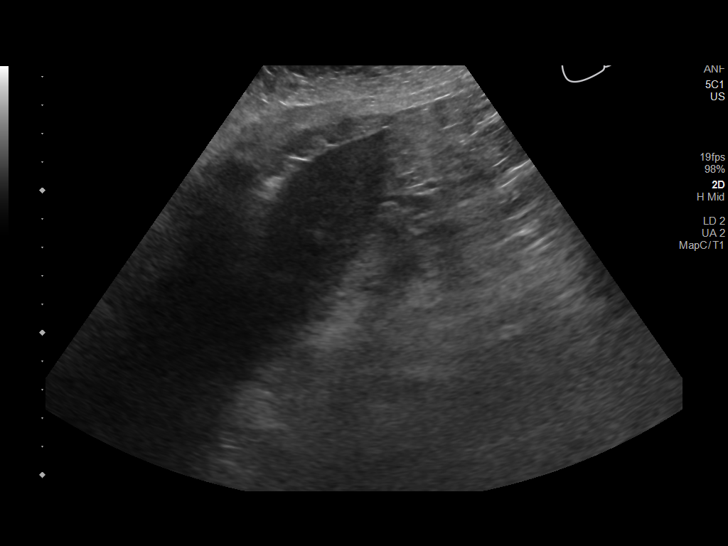
[im 36/78]
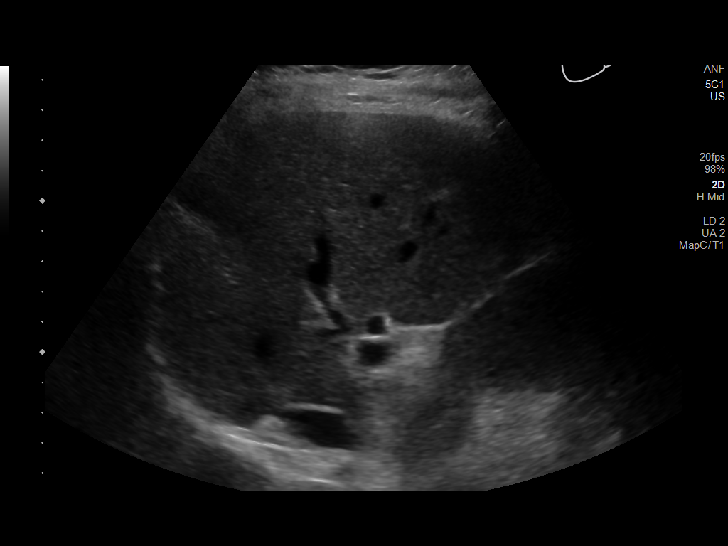
[im 42/78]
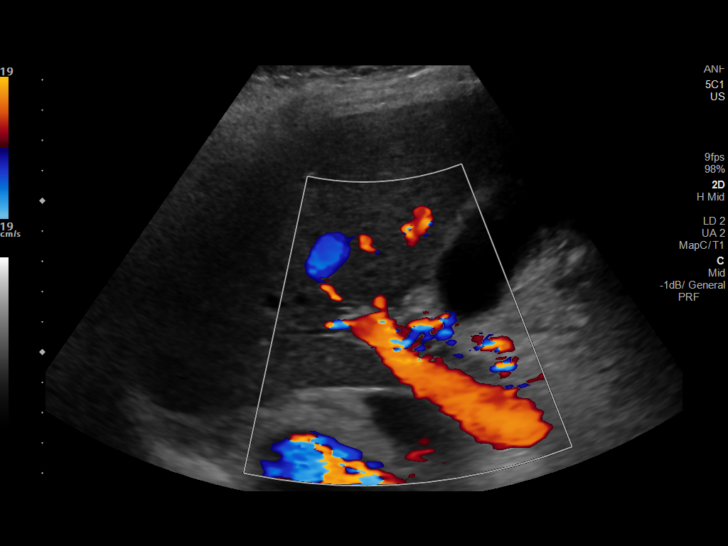
[im 49/78]
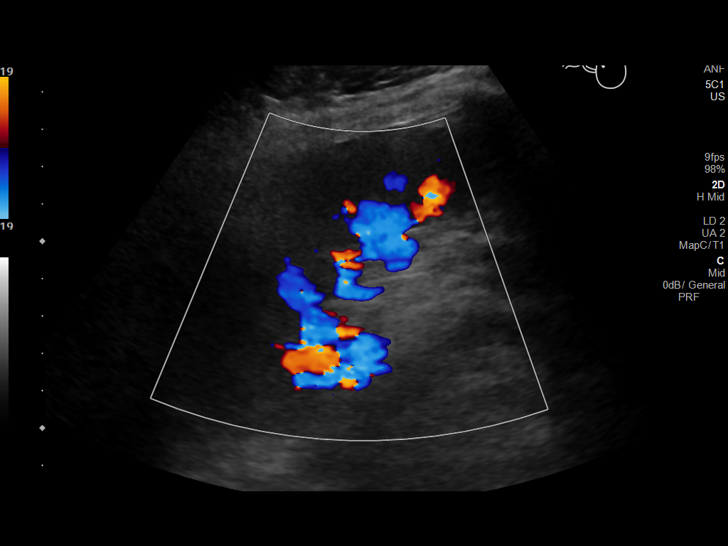
[im 52/78]
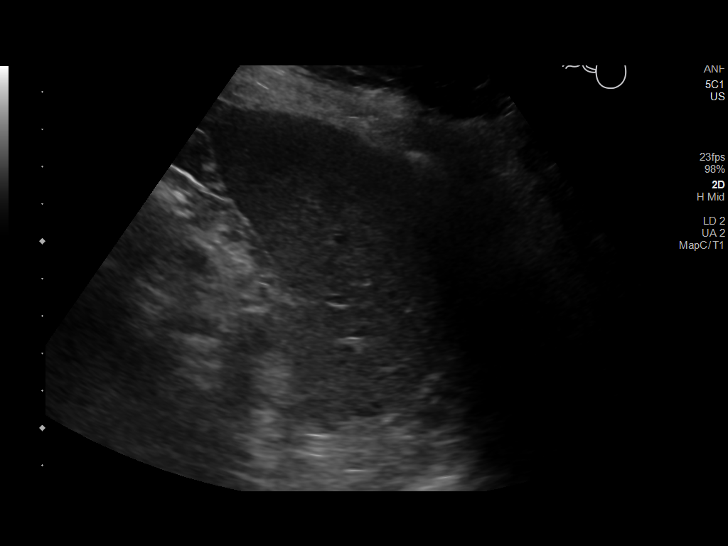
[im 58/78]
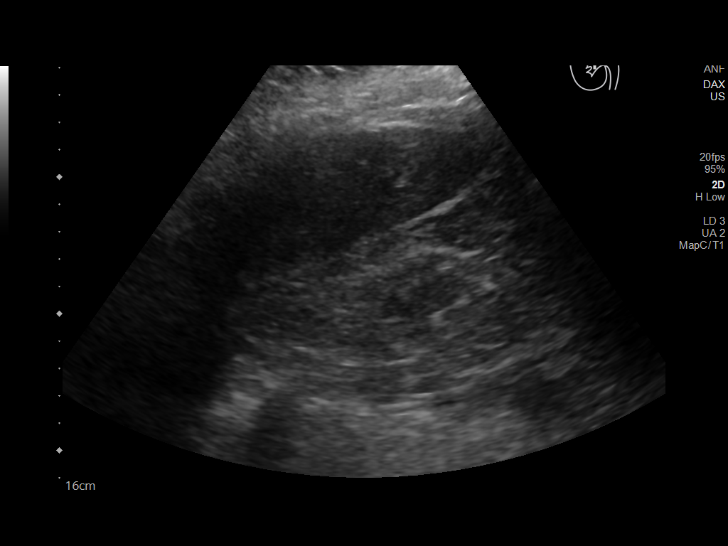
[im 65/78]
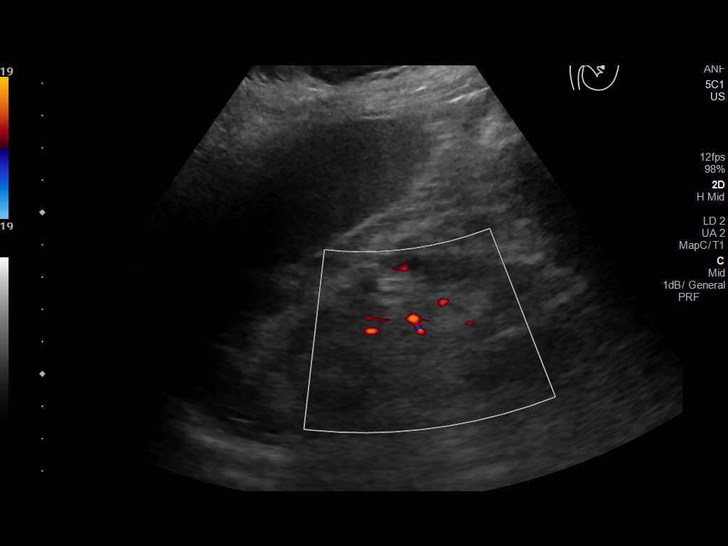
[im 71/78]
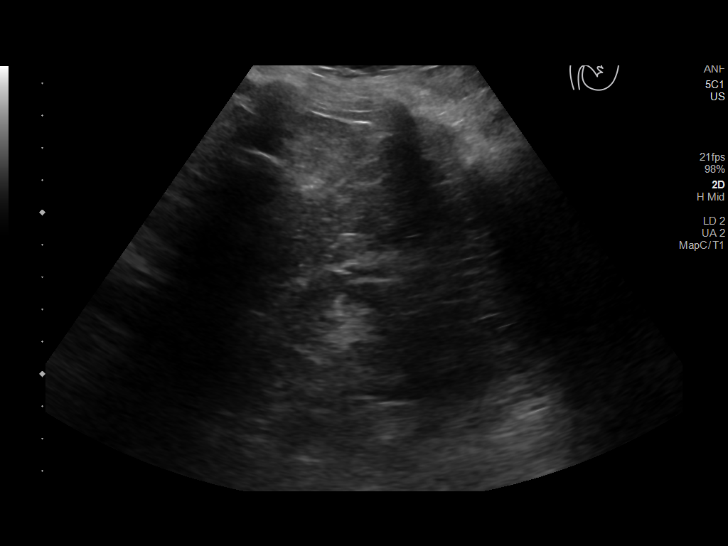
[im 78/78]
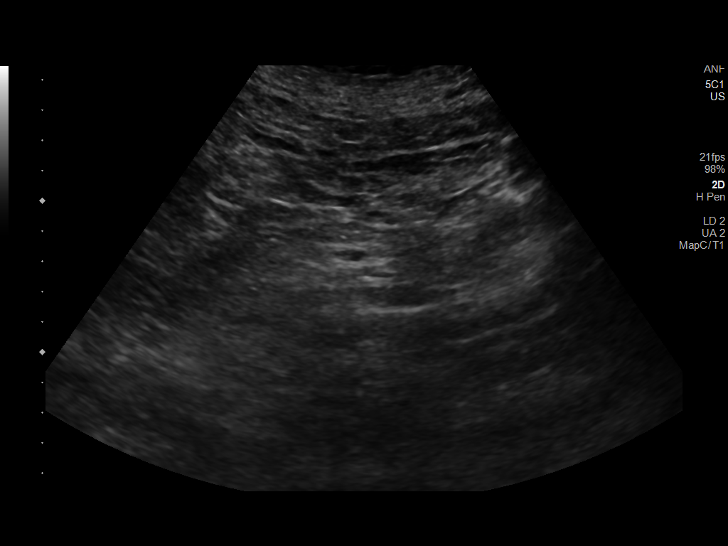

[14 of 25 positions shown; findings below may reference images not displayed]

FINDINGS: Gallbladder: Cholelithiasis. No gallbladder wall thickening. No
pericholecystic fluid. No sonographic Murphy sign.

Common bile duct: Diameter: 10.8 mm.  No choledocholithiasis.

Liver: No focal hepatic lesion. Mild coarse hepatic echotexture with
normal contour. Portal vein is patent on color Doppler imaging with
normal direction of blood flow towards the liver.

IVC: No abnormality visualized.

Pancreas: Limited visualization secondary to overlying bowel gas.

Spleen: Size and appearance within normal limits.

Right Kidney: Length: 8.9 cm. Renal cortical thinning. Echogenicity
within normal limits. No mass or hydronephrosis visualized.

Left Kidney: Length: 7.8 cm. Renal cortical thinning. Echogenicity
within normal limits. No mass or hydronephrosis visualized.

Abdominal aorta: No aneurysm visualized.

Other findings: None.
IMPRESSION: 1. Cholelithiasis without sonographic evidence of acute
cholecystitis.
2. Bilateral renal atrophy.

## 2020-08-30 DIAGNOSIS — I129 Hypertensive chronic kidney disease with stage 1 through stage 4 chronic kidney disease, or unspecified chronic kidney disease: Secondary | ICD-10-CM | POA: Diagnosis not present

## 2020-08-30 DIAGNOSIS — I358 Other nonrheumatic aortic valve disorders: Secondary | ICD-10-CM | POA: Diagnosis not present

## 2020-08-30 DIAGNOSIS — N1832 Chronic kidney disease, stage 3b: Secondary | ICD-10-CM | POA: Diagnosis not present

## 2020-08-30 DIAGNOSIS — Z79899 Other long term (current) drug therapy: Secondary | ICD-10-CM | POA: Diagnosis not present

## 2020-10-19 DIAGNOSIS — N1832 Chronic kidney disease, stage 3b: Secondary | ICD-10-CM | POA: Diagnosis not present

## 2020-10-19 DIAGNOSIS — I129 Hypertensive chronic kidney disease with stage 1 through stage 4 chronic kidney disease, or unspecified chronic kidney disease: Secondary | ICD-10-CM | POA: Diagnosis not present

## 2020-10-19 DIAGNOSIS — I358 Other nonrheumatic aortic valve disorders: Secondary | ICD-10-CM | POA: Diagnosis not present

## 2020-11-03 DIAGNOSIS — R269 Unspecified abnormalities of gait and mobility: Secondary | ICD-10-CM | POA: Diagnosis not present

## 2020-11-03 DIAGNOSIS — I129 Hypertensive chronic kidney disease with stage 1 through stage 4 chronic kidney disease, or unspecified chronic kidney disease: Secondary | ICD-10-CM | POA: Diagnosis not present

## 2020-11-03 DIAGNOSIS — R296 Repeated falls: Secondary | ICD-10-CM | POA: Diagnosis not present

## 2020-11-03 DIAGNOSIS — N1832 Chronic kidney disease, stage 3b: Secondary | ICD-10-CM | POA: Diagnosis not present

## 2020-11-04 DIAGNOSIS — I129 Hypertensive chronic kidney disease with stage 1 through stage 4 chronic kidney disease, or unspecified chronic kidney disease: Secondary | ICD-10-CM | POA: Diagnosis not present

## 2020-11-04 DIAGNOSIS — H35033 Hypertensive retinopathy, bilateral: Secondary | ICD-10-CM | POA: Diagnosis not present

## 2020-11-04 DIAGNOSIS — K219 Gastro-esophageal reflux disease without esophagitis: Secondary | ICD-10-CM | POA: Diagnosis not present

## 2020-11-04 DIAGNOSIS — E7800 Pure hypercholesterolemia, unspecified: Secondary | ICD-10-CM | POA: Diagnosis not present

## 2020-11-04 DIAGNOSIS — E039 Hypothyroidism, unspecified: Secondary | ICD-10-CM | POA: Diagnosis not present

## 2020-11-04 DIAGNOSIS — N1832 Chronic kidney disease, stage 3b: Secondary | ICD-10-CM | POA: Diagnosis not present

## 2020-11-04 DIAGNOSIS — N2581 Secondary hyperparathyroidism of renal origin: Secondary | ICD-10-CM | POA: Diagnosis not present

## 2020-11-07 DIAGNOSIS — M6281 Muscle weakness (generalized): Secondary | ICD-10-CM | POA: Diagnosis not present

## 2020-11-17 DIAGNOSIS — I872 Venous insufficiency (chronic) (peripheral): Secondary | ICD-10-CM | POA: Diagnosis not present

## 2020-11-17 DIAGNOSIS — M6281 Muscle weakness (generalized): Secondary | ICD-10-CM | POA: Diagnosis not present

## 2021-02-02 DIAGNOSIS — E039 Hypothyroidism, unspecified: Secondary | ICD-10-CM | POA: Diagnosis not present

## 2021-02-02 DIAGNOSIS — K219 Gastro-esophageal reflux disease without esophagitis: Secondary | ICD-10-CM | POA: Diagnosis not present

## 2021-02-02 DIAGNOSIS — N2581 Secondary hyperparathyroidism of renal origin: Secondary | ICD-10-CM | POA: Diagnosis not present

## 2021-02-02 DIAGNOSIS — E7800 Pure hypercholesterolemia, unspecified: Secondary | ICD-10-CM | POA: Diagnosis not present

## 2021-02-02 DIAGNOSIS — H35033 Hypertensive retinopathy, bilateral: Secondary | ICD-10-CM | POA: Diagnosis not present

## 2021-02-02 DIAGNOSIS — I129 Hypertensive chronic kidney disease with stage 1 through stage 4 chronic kidney disease, or unspecified chronic kidney disease: Secondary | ICD-10-CM | POA: Diagnosis not present

## 2021-02-02 DIAGNOSIS — N1832 Chronic kidney disease, stage 3b: Secondary | ICD-10-CM | POA: Diagnosis not present

## 2021-02-10 DIAGNOSIS — H524 Presbyopia: Secondary | ICD-10-CM | POA: Diagnosis not present

## 2021-02-10 DIAGNOSIS — H538 Other visual disturbances: Secondary | ICD-10-CM | POA: Diagnosis not present

## 2021-02-10 DIAGNOSIS — H35033 Hypertensive retinopathy, bilateral: Secondary | ICD-10-CM | POA: Diagnosis not present

## 2021-02-10 DIAGNOSIS — H35373 Puckering of macula, bilateral: Secondary | ICD-10-CM | POA: Diagnosis not present

## 2021-02-10 DIAGNOSIS — H04123 Dry eye syndrome of bilateral lacrimal glands: Secondary | ICD-10-CM | POA: Diagnosis not present

## 2021-02-15 DIAGNOSIS — N1832 Chronic kidney disease, stage 3b: Secondary | ICD-10-CM | POA: Diagnosis not present

## 2021-02-15 DIAGNOSIS — I129 Hypertensive chronic kidney disease with stage 1 through stage 4 chronic kidney disease, or unspecified chronic kidney disease: Secondary | ICD-10-CM | POA: Diagnosis not present

## 2021-03-16 ENCOUNTER — Other Ambulatory Visit: Payer: Self-pay

## 2021-03-16 ENCOUNTER — Encounter (INDEPENDENT_AMBULATORY_CARE_PROVIDER_SITE_OTHER): Payer: PPO | Admitting: Ophthalmology

## 2021-03-16 DIAGNOSIS — H348312 Tributary (branch) retinal vein occlusion, right eye, stable: Secondary | ICD-10-CM | POA: Diagnosis not present

## 2021-03-16 DIAGNOSIS — H35033 Hypertensive retinopathy, bilateral: Secondary | ICD-10-CM | POA: Diagnosis not present

## 2021-03-16 DIAGNOSIS — H35371 Puckering of macula, right eye: Secondary | ICD-10-CM | POA: Diagnosis not present

## 2021-03-16 DIAGNOSIS — I10 Essential (primary) hypertension: Secondary | ICD-10-CM

## 2021-03-16 DIAGNOSIS — H43813 Vitreous degeneration, bilateral: Secondary | ICD-10-CM | POA: Diagnosis not present

## 2021-03-20 DIAGNOSIS — H35033 Hypertensive retinopathy, bilateral: Secondary | ICD-10-CM | POA: Diagnosis not present

## 2021-03-20 DIAGNOSIS — I129 Hypertensive chronic kidney disease with stage 1 through stage 4 chronic kidney disease, or unspecified chronic kidney disease: Secondary | ICD-10-CM | POA: Diagnosis not present

## 2021-03-20 DIAGNOSIS — N1832 Chronic kidney disease, stage 3b: Secondary | ICD-10-CM | POA: Diagnosis not present

## 2021-04-13 ENCOUNTER — Other Ambulatory Visit: Payer: Self-pay

## 2021-04-13 ENCOUNTER — Encounter (INDEPENDENT_AMBULATORY_CARE_PROVIDER_SITE_OTHER): Payer: PPO | Admitting: Ophthalmology

## 2021-04-13 DIAGNOSIS — I10 Essential (primary) hypertension: Secondary | ICD-10-CM | POA: Diagnosis not present

## 2021-04-13 DIAGNOSIS — H43813 Vitreous degeneration, bilateral: Secondary | ICD-10-CM | POA: Diagnosis not present

## 2021-04-13 DIAGNOSIS — H348312 Tributary (branch) retinal vein occlusion, right eye, stable: Secondary | ICD-10-CM

## 2021-04-13 DIAGNOSIS — H35033 Hypertensive retinopathy, bilateral: Secondary | ICD-10-CM | POA: Diagnosis not present

## 2021-04-17 DIAGNOSIS — E7800 Pure hypercholesterolemia, unspecified: Secondary | ICD-10-CM | POA: Diagnosis not present

## 2021-04-17 DIAGNOSIS — H35033 Hypertensive retinopathy, bilateral: Secondary | ICD-10-CM | POA: Diagnosis not present

## 2021-04-17 DIAGNOSIS — I129 Hypertensive chronic kidney disease with stage 1 through stage 4 chronic kidney disease, or unspecified chronic kidney disease: Secondary | ICD-10-CM | POA: Diagnosis not present

## 2021-04-17 DIAGNOSIS — N1832 Chronic kidney disease, stage 3b: Secondary | ICD-10-CM | POA: Diagnosis not present

## 2021-04-17 DIAGNOSIS — I700 Atherosclerosis of aorta: Secondary | ICD-10-CM | POA: Diagnosis not present

## 2021-05-01 DIAGNOSIS — K219 Gastro-esophageal reflux disease without esophagitis: Secondary | ICD-10-CM | POA: Diagnosis not present

## 2021-05-01 DIAGNOSIS — I10 Essential (primary) hypertension: Secondary | ICD-10-CM | POA: Diagnosis not present

## 2021-05-01 DIAGNOSIS — E7800 Pure hypercholesterolemia, unspecified: Secondary | ICD-10-CM | POA: Diagnosis not present

## 2021-05-01 DIAGNOSIS — E039 Hypothyroidism, unspecified: Secondary | ICD-10-CM | POA: Diagnosis not present

## 2021-05-01 DIAGNOSIS — I129 Hypertensive chronic kidney disease with stage 1 through stage 4 chronic kidney disease, or unspecified chronic kidney disease: Secondary | ICD-10-CM | POA: Diagnosis not present

## 2021-05-02 DIAGNOSIS — C44329 Squamous cell carcinoma of skin of other parts of face: Secondary | ICD-10-CM | POA: Diagnosis not present

## 2021-05-02 DIAGNOSIS — Z85828 Personal history of other malignant neoplasm of skin: Secondary | ICD-10-CM | POA: Diagnosis not present

## 2021-05-02 DIAGNOSIS — C44319 Basal cell carcinoma of skin of other parts of face: Secondary | ICD-10-CM | POA: Diagnosis not present

## 2021-05-17 DIAGNOSIS — N1832 Chronic kidney disease, stage 3b: Secondary | ICD-10-CM | POA: Diagnosis not present

## 2021-05-17 DIAGNOSIS — R911 Solitary pulmonary nodule: Secondary | ICD-10-CM | POA: Diagnosis not present

## 2021-05-17 DIAGNOSIS — I129 Hypertensive chronic kidney disease with stage 1 through stage 4 chronic kidney disease, or unspecified chronic kidney disease: Secondary | ICD-10-CM | POA: Diagnosis not present

## 2021-05-17 DIAGNOSIS — N2581 Secondary hyperparathyroidism of renal origin: Secondary | ICD-10-CM | POA: Diagnosis not present

## 2021-05-18 ENCOUNTER — Other Ambulatory Visit: Payer: Self-pay | Admitting: Geriatric Medicine

## 2021-05-18 DIAGNOSIS — R911 Solitary pulmonary nodule: Secondary | ICD-10-CM

## 2021-05-31 DIAGNOSIS — C44329 Squamous cell carcinoma of skin of other parts of face: Secondary | ICD-10-CM | POA: Diagnosis not present

## 2021-05-31 DIAGNOSIS — C44319 Basal cell carcinoma of skin of other parts of face: Secondary | ICD-10-CM | POA: Diagnosis not present

## 2021-05-31 DIAGNOSIS — Z85828 Personal history of other malignant neoplasm of skin: Secondary | ICD-10-CM | POA: Diagnosis not present

## 2021-06-07 DIAGNOSIS — D485 Neoplasm of uncertain behavior of skin: Secondary | ICD-10-CM | POA: Diagnosis not present

## 2021-06-07 DIAGNOSIS — C44311 Basal cell carcinoma of skin of nose: Secondary | ICD-10-CM | POA: Diagnosis not present

## 2021-06-08 ENCOUNTER — Encounter (INDEPENDENT_AMBULATORY_CARE_PROVIDER_SITE_OTHER): Payer: PPO | Admitting: Ophthalmology

## 2021-06-08 DIAGNOSIS — H35033 Hypertensive retinopathy, bilateral: Secondary | ICD-10-CM

## 2021-06-08 DIAGNOSIS — I10 Essential (primary) hypertension: Secondary | ICD-10-CM

## 2021-06-08 DIAGNOSIS — H348312 Tributary (branch) retinal vein occlusion, right eye, stable: Secondary | ICD-10-CM

## 2021-06-08 DIAGNOSIS — H43813 Vitreous degeneration, bilateral: Secondary | ICD-10-CM

## 2021-06-13 ENCOUNTER — Other Ambulatory Visit: Payer: PPO

## 2021-06-19 DIAGNOSIS — Z1389 Encounter for screening for other disorder: Secondary | ICD-10-CM | POA: Diagnosis not present

## 2021-06-19 DIAGNOSIS — Z0000 Encounter for general adult medical examination without abnormal findings: Secondary | ICD-10-CM | POA: Diagnosis not present

## 2021-06-19 DIAGNOSIS — I129 Hypertensive chronic kidney disease with stage 1 through stage 4 chronic kidney disease, or unspecified chronic kidney disease: Secondary | ICD-10-CM | POA: Diagnosis not present

## 2021-06-19 DIAGNOSIS — L989 Disorder of the skin and subcutaneous tissue, unspecified: Secondary | ICD-10-CM | POA: Diagnosis not present

## 2021-06-19 DIAGNOSIS — Z79899 Other long term (current) drug therapy: Secondary | ICD-10-CM | POA: Diagnosis not present

## 2021-06-19 DIAGNOSIS — R911 Solitary pulmonary nodule: Secondary | ICD-10-CM | POA: Diagnosis not present

## 2021-06-19 DIAGNOSIS — K9089 Other intestinal malabsorption: Secondary | ICD-10-CM | POA: Diagnosis not present

## 2021-06-19 DIAGNOSIS — K219 Gastro-esophageal reflux disease without esophagitis: Secondary | ICD-10-CM | POA: Diagnosis not present

## 2021-06-19 DIAGNOSIS — E7800 Pure hypercholesterolemia, unspecified: Secondary | ICD-10-CM | POA: Diagnosis not present

## 2021-06-19 DIAGNOSIS — R600 Localized edema: Secondary | ICD-10-CM | POA: Diagnosis not present

## 2021-06-19 DIAGNOSIS — E039 Hypothyroidism, unspecified: Secondary | ICD-10-CM | POA: Diagnosis not present

## 2021-06-19 DIAGNOSIS — N1832 Chronic kidney disease, stage 3b: Secondary | ICD-10-CM | POA: Diagnosis not present

## 2021-07-07 ENCOUNTER — Ambulatory Visit
Admission: RE | Admit: 2021-07-07 | Discharge: 2021-07-07 | Disposition: A | Discharge status: Home / Self Care | Payer: PPO | Source: Ambulatory Visit | Attending: Geriatric Medicine | Admitting: Geriatric Medicine

## 2021-07-07 DIAGNOSIS — R918 Other nonspecific abnormal finding of lung field: Secondary | ICD-10-CM | POA: Diagnosis not present

## 2021-07-07 DIAGNOSIS — R911 Solitary pulmonary nodule: Secondary | ICD-10-CM | POA: Diagnosis not present

## 2021-07-07 DIAGNOSIS — J9811 Atelectasis: Secondary | ICD-10-CM | POA: Diagnosis not present

## 2021-07-18 DIAGNOSIS — C44311 Basal cell carcinoma of skin of nose: Secondary | ICD-10-CM | POA: Diagnosis not present

## 2021-07-18 DIAGNOSIS — Z85828 Personal history of other malignant neoplasm of skin: Secondary | ICD-10-CM | POA: Diagnosis not present

## 2021-08-09 DIAGNOSIS — D045 Carcinoma in situ of skin of trunk: Secondary | ICD-10-CM | POA: Diagnosis not present

## 2021-08-09 DIAGNOSIS — L814 Other melanin hyperpigmentation: Secondary | ICD-10-CM | POA: Diagnosis not present

## 2021-08-09 DIAGNOSIS — L570 Actinic keratosis: Secondary | ICD-10-CM | POA: Diagnosis not present

## 2021-08-09 DIAGNOSIS — C44329 Squamous cell carcinoma of skin of other parts of face: Secondary | ICD-10-CM | POA: Diagnosis not present

## 2021-08-09 DIAGNOSIS — L821 Other seborrheic keratosis: Secondary | ICD-10-CM | POA: Diagnosis not present

## 2021-08-09 DIAGNOSIS — Z85828 Personal history of other malignant neoplasm of skin: Secondary | ICD-10-CM | POA: Diagnosis not present

## 2021-08-10 ENCOUNTER — Encounter (INDEPENDENT_AMBULATORY_CARE_PROVIDER_SITE_OTHER): Payer: PPO | Admitting: Ophthalmology

## 2021-08-10 DIAGNOSIS — H43813 Vitreous degeneration, bilateral: Secondary | ICD-10-CM

## 2021-08-10 DIAGNOSIS — H3509 Other intraretinal microvascular abnormalities: Secondary | ICD-10-CM

## 2021-08-10 DIAGNOSIS — I10 Essential (primary) hypertension: Secondary | ICD-10-CM | POA: Diagnosis not present

## 2021-08-10 DIAGNOSIS — H35033 Hypertensive retinopathy, bilateral: Secondary | ICD-10-CM

## 2021-08-10 DIAGNOSIS — H348312 Tributary (branch) retinal vein occlusion, right eye, stable: Secondary | ICD-10-CM

## 2021-08-10 DIAGNOSIS — H3561 Retinal hemorrhage, right eye: Secondary | ICD-10-CM | POA: Diagnosis not present

## 2021-09-01 DIAGNOSIS — N1832 Chronic kidney disease, stage 3b: Secondary | ICD-10-CM | POA: Diagnosis not present

## 2021-09-01 DIAGNOSIS — I129 Hypertensive chronic kidney disease with stage 1 through stage 4 chronic kidney disease, or unspecified chronic kidney disease: Secondary | ICD-10-CM | POA: Diagnosis not present

## 2021-09-22 DIAGNOSIS — I129 Hypertensive chronic kidney disease with stage 1 through stage 4 chronic kidney disease, or unspecified chronic kidney disease: Secondary | ICD-10-CM | POA: Diagnosis not present

## 2021-09-22 DIAGNOSIS — N1832 Chronic kidney disease, stage 3b: Secondary | ICD-10-CM | POA: Diagnosis not present

## 2021-09-22 DIAGNOSIS — M542 Cervicalgia: Secondary | ICD-10-CM | POA: Diagnosis not present

## 2021-10-30 ENCOUNTER — Encounter (INDEPENDENT_AMBULATORY_CARE_PROVIDER_SITE_OTHER): Payer: PPO | Admitting: Ophthalmology

## 2021-10-31 ENCOUNTER — Encounter (INDEPENDENT_AMBULATORY_CARE_PROVIDER_SITE_OTHER): Payer: PPO | Admitting: Ophthalmology

## 2021-10-31 DIAGNOSIS — H43813 Vitreous degeneration, bilateral: Secondary | ICD-10-CM

## 2021-10-31 DIAGNOSIS — H3509 Other intraretinal microvascular abnormalities: Secondary | ICD-10-CM | POA: Diagnosis not present

## 2021-10-31 DIAGNOSIS — H35033 Hypertensive retinopathy, bilateral: Secondary | ICD-10-CM

## 2021-10-31 DIAGNOSIS — I10 Essential (primary) hypertension: Secondary | ICD-10-CM | POA: Diagnosis not present

## 2021-11-09 DIAGNOSIS — N1832 Chronic kidney disease, stage 3b: Secondary | ICD-10-CM | POA: Diagnosis not present

## 2021-11-09 DIAGNOSIS — E039 Hypothyroidism, unspecified: Secondary | ICD-10-CM | POA: Diagnosis not present

## 2021-11-09 DIAGNOSIS — I129 Hypertensive chronic kidney disease with stage 1 through stage 4 chronic kidney disease, or unspecified chronic kidney disease: Secondary | ICD-10-CM | POA: Diagnosis not present

## 2021-11-09 DIAGNOSIS — I10 Essential (primary) hypertension: Secondary | ICD-10-CM | POA: Diagnosis not present

## 2021-12-12 DIAGNOSIS — I129 Hypertensive chronic kidney disease with stage 1 through stage 4 chronic kidney disease, or unspecified chronic kidney disease: Secondary | ICD-10-CM | POA: Diagnosis not present

## 2021-12-12 DIAGNOSIS — E782 Mixed hyperlipidemia: Secondary | ICD-10-CM | POA: Diagnosis not present

## 2021-12-12 DIAGNOSIS — I10 Essential (primary) hypertension: Secondary | ICD-10-CM | POA: Diagnosis not present

## 2021-12-12 DIAGNOSIS — M1990 Unspecified osteoarthritis, unspecified site: Secondary | ICD-10-CM | POA: Diagnosis not present

## 2022-01-29 ENCOUNTER — Encounter (INDEPENDENT_AMBULATORY_CARE_PROVIDER_SITE_OTHER): Payer: PPO | Admitting: Ophthalmology

## 2022-01-29 DIAGNOSIS — H43813 Vitreous degeneration, bilateral: Secondary | ICD-10-CM | POA: Diagnosis not present

## 2022-01-29 DIAGNOSIS — H3509 Other intraretinal microvascular abnormalities: Secondary | ICD-10-CM | POA: Diagnosis not present

## 2022-01-29 DIAGNOSIS — H35033 Hypertensive retinopathy, bilateral: Secondary | ICD-10-CM

## 2022-01-29 DIAGNOSIS — I10 Essential (primary) hypertension: Secondary | ICD-10-CM

## 2022-02-09 DIAGNOSIS — D485 Neoplasm of uncertain behavior of skin: Secondary | ICD-10-CM | POA: Diagnosis not present

## 2022-02-09 DIAGNOSIS — I8312 Varicose veins of left lower extremity with inflammation: Secondary | ICD-10-CM | POA: Diagnosis not present

## 2022-02-09 DIAGNOSIS — L821 Other seborrheic keratosis: Secondary | ICD-10-CM | POA: Diagnosis not present

## 2022-02-09 DIAGNOSIS — I8311 Varicose veins of right lower extremity with inflammation: Secondary | ICD-10-CM | POA: Diagnosis not present

## 2022-02-09 DIAGNOSIS — I872 Venous insufficiency (chronic) (peripheral): Secondary | ICD-10-CM | POA: Diagnosis not present

## 2022-02-09 DIAGNOSIS — L578 Other skin changes due to chronic exposure to nonionizing radiation: Secondary | ICD-10-CM | POA: Diagnosis not present

## 2022-02-09 DIAGNOSIS — Z85828 Personal history of other malignant neoplasm of skin: Secondary | ICD-10-CM | POA: Diagnosis not present

## 2022-02-09 DIAGNOSIS — L814 Other melanin hyperpigmentation: Secondary | ICD-10-CM | POA: Diagnosis not present

## 2022-02-12 DIAGNOSIS — H35373 Puckering of macula, bilateral: Secondary | ICD-10-CM | POA: Diagnosis not present

## 2022-02-12 DIAGNOSIS — H348312 Tributary (branch) retinal vein occlusion, right eye, stable: Secondary | ICD-10-CM | POA: Diagnosis not present

## 2022-02-12 DIAGNOSIS — H524 Presbyopia: Secondary | ICD-10-CM | POA: Diagnosis not present

## 2022-02-12 DIAGNOSIS — H538 Other visual disturbances: Secondary | ICD-10-CM | POA: Diagnosis not present

## 2022-02-12 DIAGNOSIS — H04123 Dry eye syndrome of bilateral lacrimal glands: Secondary | ICD-10-CM | POA: Diagnosis not present

## 2022-02-12 DIAGNOSIS — H35033 Hypertensive retinopathy, bilateral: Secondary | ICD-10-CM | POA: Diagnosis not present

## 2022-02-13 ENCOUNTER — Ambulatory Visit (INDEPENDENT_AMBULATORY_CARE_PROVIDER_SITE_OTHER): Payer: PPO | Admitting: Internal Medicine

## 2022-02-13 ENCOUNTER — Encounter: Payer: Self-pay | Admitting: Internal Medicine

## 2022-02-13 VITALS — BP 126/64 | HR 57 | Temp 98.1°F | Resp 18 | Ht 63.0 in | Wt 156.2 lb

## 2022-02-13 DIAGNOSIS — N1832 Chronic kidney disease, stage 3b: Secondary | ICD-10-CM

## 2022-02-13 DIAGNOSIS — I10 Essential (primary) hypertension: Secondary | ICD-10-CM

## 2022-02-13 DIAGNOSIS — E039 Hypothyroidism, unspecified: Secondary | ICD-10-CM | POA: Diagnosis not present

## 2022-02-13 NOTE — Progress Notes (Signed)
   Established Patient Office Visit  Subjective   Patient ID: Akiyah Eppolito, female    DOB: [birth year]  Age: 88 y.o. MRN: [id number]  Chief Complaint  Patient presents with   Follow-up    2 month    HPI 88 years old female is here for follow-up of hypertension, hypothyroidism and CKD 3B.  She says that she is feeling fine.  She will walk and after walking 2 blocks she gets a little short of breath.  She ran out of her diuretic and noticed some swelling in her leg.  She got her medication today.  I have reviewed her labs that were drawn on last visit.  Her blood pressure is diabetes controlled.  Her hypothyroidism is also stable.  Her CKD 3B has not changed since last year.   Review of Systems  Constitutional: Negative.   Respiratory:  Positive for shortness of breath.   Cardiovascular: Negative.   Gastrointestinal: Negative.       Objective:     BP 126/64 (BP Location: Left Arm, Patient Position: Sitting, Cuff Size: Normal)   Pulse (!) 57   Temp 98.1 F (36.7 C) (Temporal)   Resp 18   Ht '5\' 3"'$  (1.6 m)   Wt 156 lb 3.2 oz (70.9 kg)   SpO2 97%   BMI 27.67 kg/m    Physical Exam Constitutional:      Appearance: Normal appearance.  HENT:     Head: Normocephalic and atraumatic.  Eyes:     Extraocular Movements: Extraocular movements intact.     Pupils: Pupils are equal, round, and reactive to light.  Cardiovascular:     Rate and Rhythm: Normal rate and regular rhythm.     Heart sounds: Murmur heard.  Pulmonary:     Effort: Pulmonary effort is normal.     Breath sounds: Normal breath sounds.  Abdominal:     General: Bowel sounds are normal.     Palpations: Abdomen is soft.  Neurological:     General: No focal deficit present.     Mental Status: She is alert and oriented to person, place, and time.      No results found for any visits on 02/13/22.   The ASCVD Risk score (Arnett DK, et al., 2019) failed to calculate for the following reasons:   The 2019 ASCVD risk  score is only valid for ages 3 to 30    Assessment & Plan:   Problem List Items Addressed This Visit   None   No follow-ups on file.    Garwin Brothers, MD

## 2022-02-15 ENCOUNTER — Other Ambulatory Visit: Payer: Self-pay

## 2022-02-15 MED ORDER — DILTIAZEM HCL ER BEADS 240 MG PO CP24: 240.0000 mg | ORAL_CAPSULE | Freq: Every day | ORAL | 0 refills | Status: DC

## 2022-02-16 ENCOUNTER — Other Ambulatory Visit: Payer: Self-pay

## 2022-02-16 MED ORDER — DILTIAZEM HCL ER COATED BEADS 240 MG PO CP24: 240.0000 mg | ORAL_CAPSULE | Freq: Every morning | ORAL | 2 refills | Status: DC

## 2022-02-16 MED ORDER — LISINOPRIL 5 MG PO TABS: 5.0000 mg | ORAL_TABLET | Freq: Every day | ORAL | 2 refills | Status: DC

## 2022-02-16 MED ORDER — LOSARTAN POTASSIUM 25 MG PO TABS: 25.0000 mg | ORAL_TABLET | Freq: Every day | ORAL | 2 refills | Status: DC

## 2022-02-16 MED ORDER — FARXIGA 5 MG PO TABS: 5.0000 mg | ORAL_TABLET | Freq: Every morning | ORAL | 2 refills | Status: DC

## 2022-02-16 MED ORDER — FUROSEMIDE 40 MG PO TABS: 40.0000 mg | ORAL_TABLET | Freq: Every morning | ORAL | 2 refills | Status: DC

## 2022-02-19 ENCOUNTER — Other Ambulatory Visit: Payer: Self-pay

## 2022-02-19 MED ORDER — LEVOTHYROXINE SODIUM 125 MCG PO TABS: 125.0000 ug | ORAL_TABLET | Freq: Every day | ORAL | 3 refills | Status: DC

## 2022-02-19 MED ORDER — OMEPRAZOLE 10 MG PO CPDR: 10.0000 mg | DELAYED_RELEASE_CAPSULE | Freq: Every day | ORAL | 2 refills | Status: DC

## 2022-03-15 ENCOUNTER — Ambulatory Visit: Payer: PPO | Admitting: Internal Medicine

## 2022-03-15 ENCOUNTER — Encounter: Payer: Self-pay | Admitting: Internal Medicine

## 2022-03-15 VITALS — BP 122/60 | HR 85 | Temp 98.2°F | Resp 18 | Ht 62.0 in

## 2022-03-15 DIAGNOSIS — L97329 Non-pressure chronic ulcer of left ankle with unspecified severity: Secondary | ICD-10-CM

## 2022-03-15 DIAGNOSIS — I83023 Varicose veins of left lower extremity with ulcer of ankle: Secondary | ICD-10-CM | POA: Diagnosis not present

## 2022-03-15 NOTE — Progress Notes (Signed)
Office Visit  Subjective   Patient ID: April Hodges   DOB: April 27, 1925   Age: 89 y.o.   MRN: [id number]   Chief Complaint Chief Complaint  Patient presents with   Joint Swelling    Left ankle and weeping      History of Present Illness The paitent is a 89 yo female who comes in today with complaints of bilateral lower extremity swelling and now with open lesions above her left ankle with clear drainage.  On review of her previous doctor notes, she has a history of chronic venous insufficiency and varicose veins and she has had vein striping 30 years ago.  She has had problems with swelling of her feet for 10-15 years however she has noticed more intermittent swelling over the last year.  She noted some ulcers that developed over her left ankle that began this past week.  She has clear drainage from these lesions and no erythema, fevers, or chills.  Today, she denies any chest pain, SOB, DOE, palpiations, PND, or orthopnea.       Past Medical History Past Medical History:  Diagnosis Date   Acute on chronic renal failure ([place]) 06/03/2015   Arthritis    Cancer ([place])    skin cancer   CKD (chronic kidney disease), stage III ([place])    Demand ischemia    a. 05/2015 in setting of sepsis w/ troponin peak of 8.47;  b. 05/2015 Echo: EF 60-65%, no rwma, Gr1 DD-->No ischemic eval undertaken.   Essential hypertension 06/03/2015   GERD (gastroesophageal reflux disease)    Headache    in younger years   Hypothyroidism    Pneumonia    as a child   PONV (postoperative nausea and vomiting)    Sinus bradycardia    a. states rates in 40's and low 50's are normal for her-->No AVN blocking agents.   Systolic murmur    a. 09/6281 No sigificant valvular dzs on echo.   Urosepsis 06/03/2015     Allergies Allergies  Allergen Reactions   Bystolic [Nebivolol Hcl] Other (See Comments)    Bradycardia - HR in the 40s   Amlodipine Swelling    Coughing as well     Review of Systems Review of Systems   Constitutional:  Negative for chills and fever.  Eyes:  Negative for blurred vision and double vision.  Respiratory:  Negative for cough and shortness of breath.   Cardiovascular:  Positive for leg swelling. Negative for chest pain and palpitations.  Gastrointestinal:  Negative for abdominal pain, constipation, diarrhea, nausea and vomiting.  Skin:  Negative for itching and rash.  Neurological:  Negative for dizziness, weakness and headaches.       Objective:    Vitals BP 122/60 (BP Location: Left Arm, Patient Position: Sitting, Cuff Size: Normal)   Pulse 85   Temp 98.2 F (36.8 C) (Temporal)   Resp 18   Ht '5\' 2"'$  (1.575 m)   SpO2 96%   BMI 28.57 kg/m    Physical Examination Physical Exam Constitutional:      Appearance: Normal appearance. She is not ill-appearing.  Cardiovascular:     Rate and Rhythm: Normal rate and regular rhythm.     Pulses: Normal pulses.     Heart sounds: No murmur heard.    No friction rub. No gallop.  Pulmonary:     Effort: Pulmonary effort is normal. No respiratory distress.     Breath sounds: Normal breath sounds. No wheezing, rhonchi or rales.  Abdominal:     General: Abdomen is flat. Bowel sounds are normal. There is no distension.     Palpations: Abdomen is soft.     Tenderness: There is no abdominal tenderness.  Musculoskeletal:     Right lower leg: Edema present.     Left lower leg: Edema present.  Skin:    General: Skin is warm and dry.     Findings: No rash.  Neurological:     General: No focal deficit present.     Mental Status: She is alert.        Assessment & Plan:   Venous stasis ulcer of left ankle with varicose veins (HCC) She has chronic venous insufficiency with a venous stasis ulcer measuring 9cm x 5.5 cm on her antior left lower leg right above her ankle.  She is going to need wound care and a Haematologist.  We will arrange.    No follow-ups on file.   Townsend Roger, MD

## 2022-03-15 NOTE — Assessment & Plan Note (Signed)
She has chronic venous insufficiency with a venous stasis ulcer measuring 9cm x 5.5 cm on her antior left lower leg right above her ankle.  She is going to need wound care and a Haematologist.  We will arrange.

## 2022-03-18 IMAGING — CT CT CHEST W/O CM
2 of 4 series · 13 of 36 positions shown, 16 images · non-contrast
Comparison: 07/12/2018

CLINICAL DATA: Follow-up for pulmonary nodule.  Nonsmoker.

EXAM:
CT CHEST WITHOUT CONTRAST
TECHNIQUE: Multidetector CT imaging of the chest was performed following the
standard protocol without IV contrast.

[Series 2: chest 2.00 br40 s3 · axial · 0.63mm/px · z∈[+1618,+1860]mm · 10 of 143 slices shown, 13 images (1 of 2)]
[im 11/143  mediastinal]
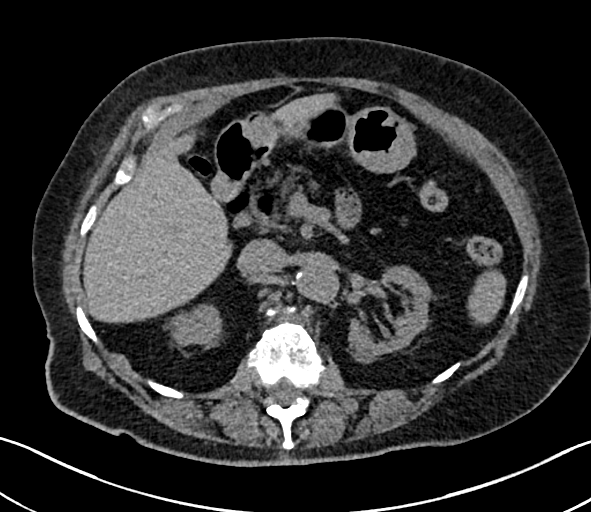
[im 11/143  lung]
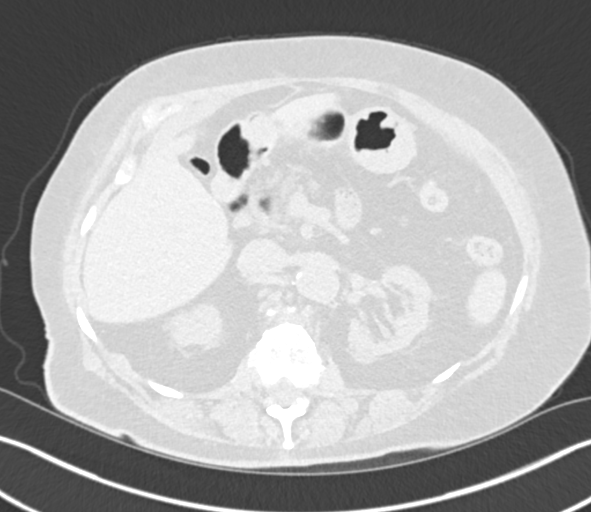
[im 22/143  lung]
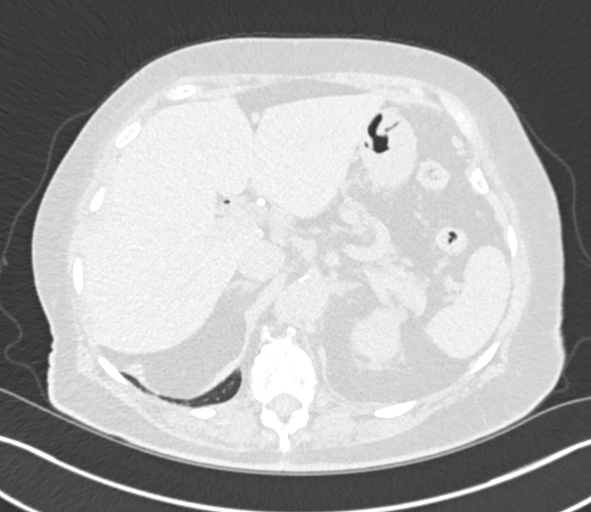
[im 44/143  lung]
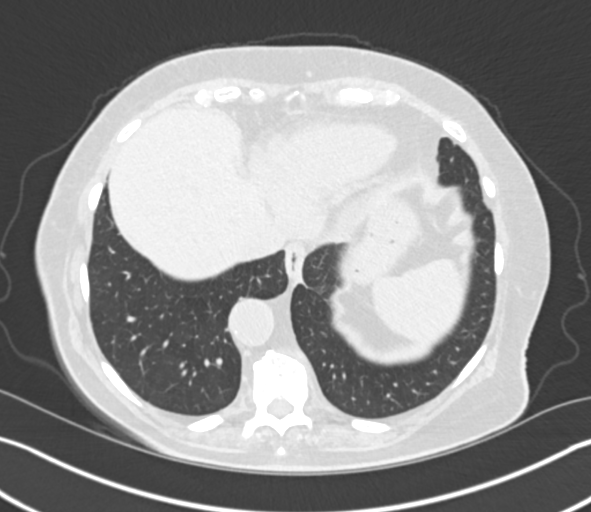
[im 55/143  lung]
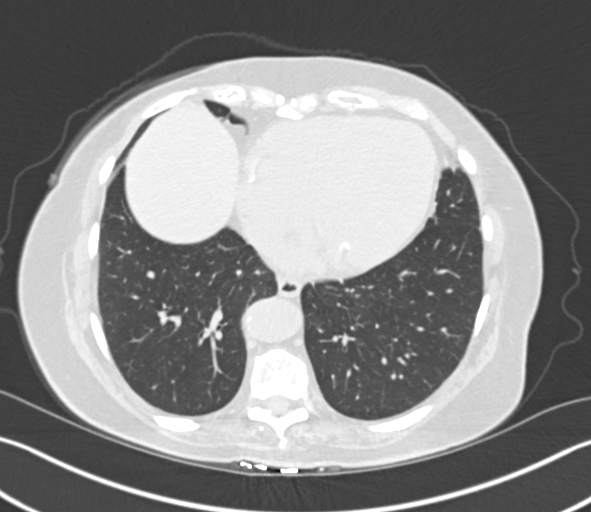
[im 66/143  mediastinal]
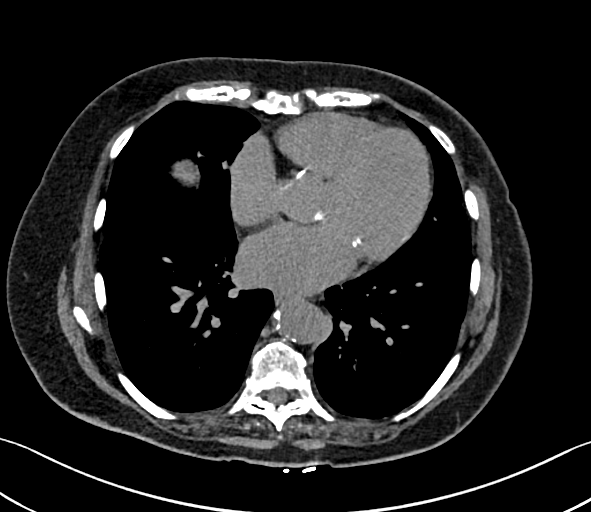
[im 66/143  lung]
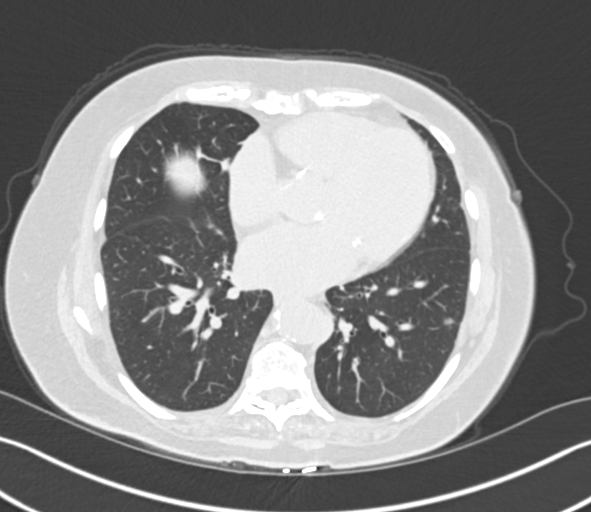
[im 77/143  lung]
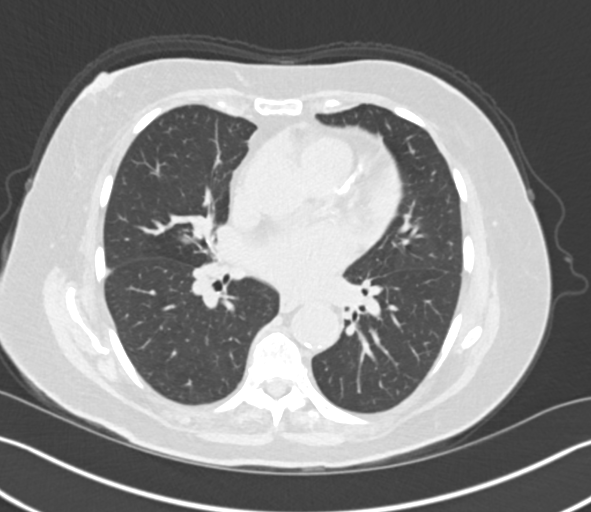
[im 88/143  lung]
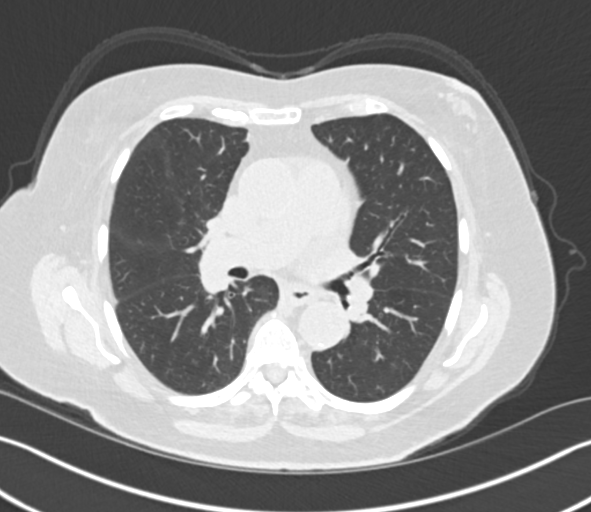
[im 110/143  lung]
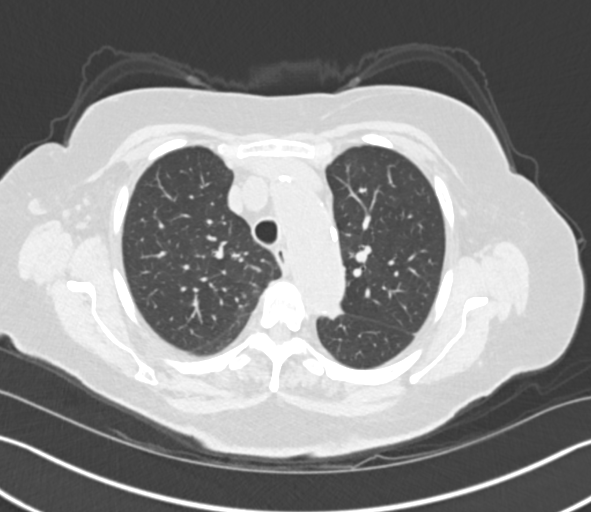
[im 121/143  mediastinal]
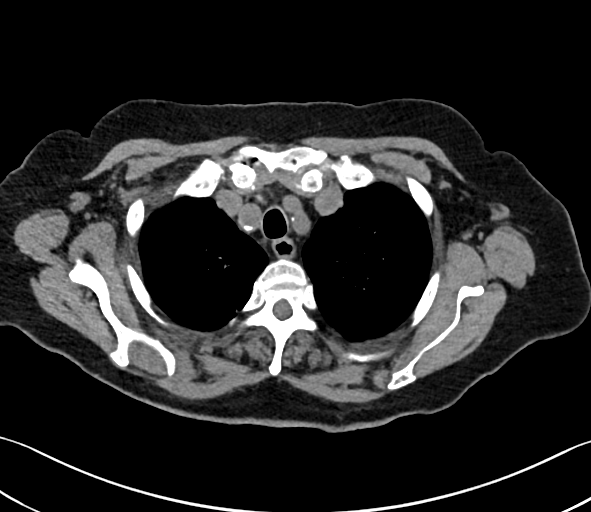
[im 121/143  lung]
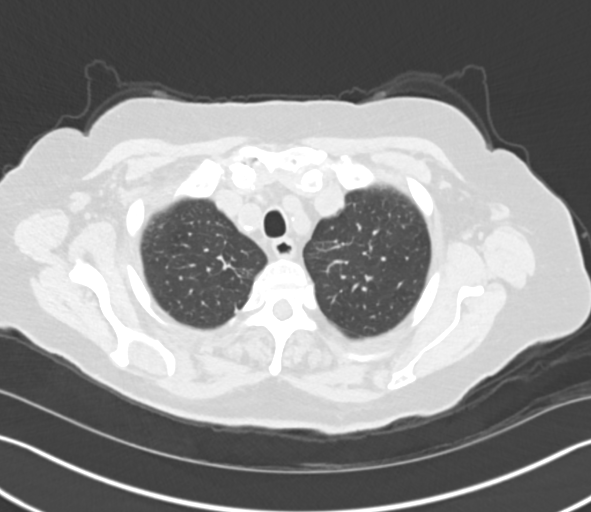
[im 132/143  lung]
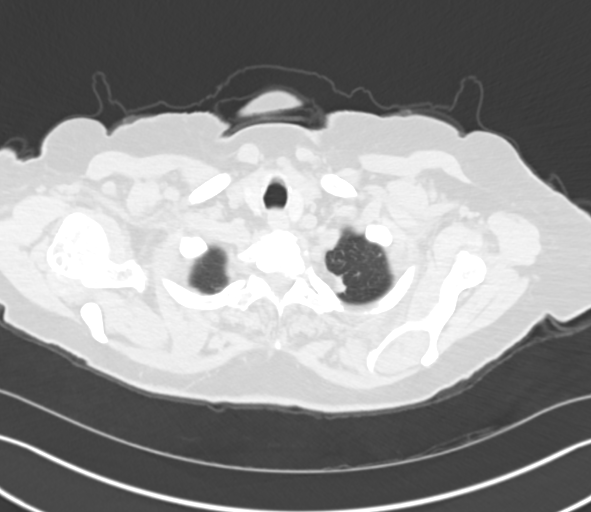

[Series 4: chest 2.00 br40 s3 · coronal · 0.56mm/px · 3 of 159 slices shown (2 of 2)]
[im 32/159  lung]
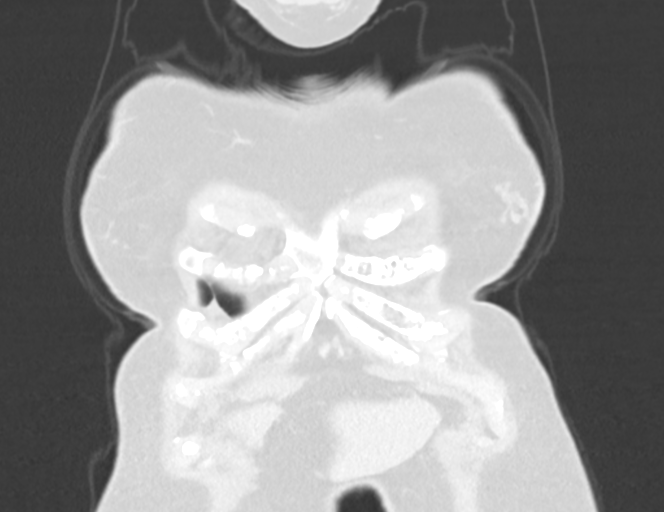
[im 64/159  lung]
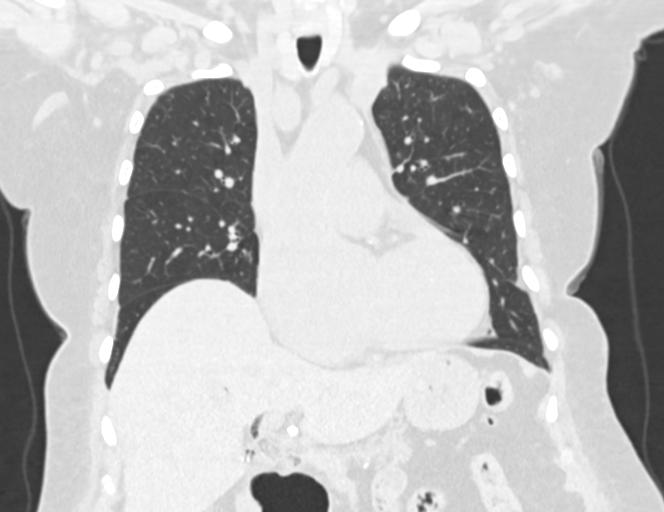
[im 95/159  lung]
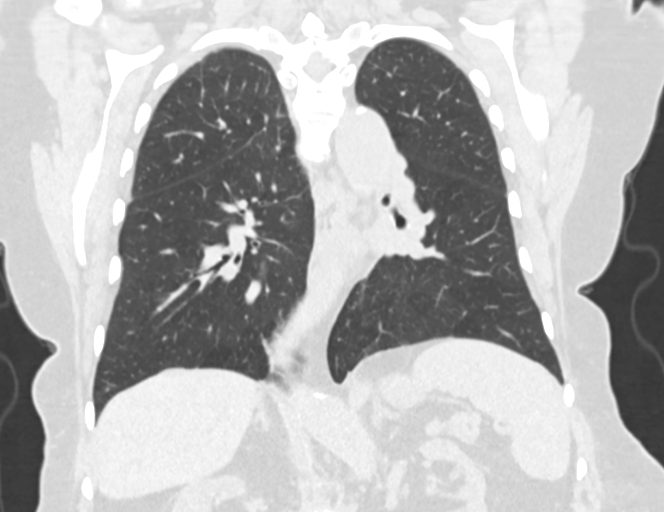

[13 of 36 positions shown; findings below may reference images not displayed]

FINDINGS: Cardiovascular: Normal heart size. No pericardial effusions.
Coronary artery and aortic calcifications. Normal caliber thoracic
aorta.

Mediastinum/Nodes: No significant lymphadenopathy. Esophagus is
decompressed.

Lungs/Pleura: Mild bilateral apical scarring. No focal consolidation
or airspace disease. Left lower lung nodule, series 8, image 79,
measures 7 mm in diameter. No change in appearance since previous
study. No new nodules are identified. No pleural effusions. No
pneumothorax.

Upper Abdomen: Surgical absence of the gallbladder. Pneumobilia is
likely postoperative. Renal parenchymal atrophy. No acute changes
demonstrated in the visualized upper abdomen.

Musculoskeletal: Degenerative changes in the spine. No destructive
bone lesions.
IMPRESSION: 1. 7 mm nodule in the left lower lung is unchanged since previous
study. No new nodules are identified. No evidence of active
pulmonary disease. Additional follow-up 24 months from the
07/12/2018 examination is recommended.
2. Surgical absence of the gallbladder with pneumobilia.
3. Renal parenchymal atrophy.
4. Aortic atherosclerosis.

Aortic Atherosclerosis ([id number].W).

## 2022-03-20 ENCOUNTER — Other Ambulatory Visit: Payer: Self-pay | Admitting: Internal Medicine

## 2022-03-20 ENCOUNTER — Ambulatory Visit: Payer: PPO | Admitting: Internal Medicine

## 2022-03-20 ENCOUNTER — Encounter: Payer: Self-pay | Admitting: Internal Medicine

## 2022-03-20 VITALS — BP 130/60 | HR 48 | Temp 97.8°F | Resp 18 | Ht 62.0 in | Wt 147.1 lb

## 2022-03-20 DIAGNOSIS — I83023 Varicose veins of left lower extremity with ulcer of ankle: Secondary | ICD-10-CM | POA: Diagnosis not present

## 2022-03-20 DIAGNOSIS — L97321 Non-pressure chronic ulcer of left ankle limited to breakdown of skin: Secondary | ICD-10-CM | POA: Diagnosis not present

## 2022-03-20 DIAGNOSIS — N1832 Chronic kidney disease, stage 3b: Secondary | ICD-10-CM | POA: Diagnosis not present

## 2022-03-20 MED ORDER — CEPHALEXIN 500 MG PO CAPS: 500.0000 mg | ORAL_CAPSULE | Freq: Four times a day (QID) | ORAL | 0 refills | Status: AC

## 2022-03-20 NOTE — Assessment & Plan Note (Signed)
I will start her on cephalaxin 500 mg every 6 hour for 7 days. [facility] it twice a week.

## 2022-03-20 NOTE — Assessment & Plan Note (Signed)
Will do cbc and cmp today

## 2022-03-20 NOTE — Progress Notes (Signed)
   Established Patient Office Visit  Subjective   Patient ID: April Hodges, female    DOB: 1926-03-11  Age: 89 y.o. MRN: [id number]  Chief Complaint  Patient presents with   Wound Check    Left ankle. Draining clear fluid.    HPI 89 years old female is here for evaluation of left lower leg venous ulcer. She was seen by Dr. Nona Dell last week and she was doing dressing by herself. She says it is getting worse and now redness is on both lower left leg. Venous ulcer is in middle. She has CKD 3b and takes lasix 40 mg daily.   ROS    Objective:     BP 130/60 (BP Location: Left Arm, Patient Position: Sitting, Cuff Size: Normal)   Pulse (!) 48   Temp 97.8 F (36.6 C)   Resp 18   Ht '5\' 2"'$  (1.575 m)   Wt 147 lb 2 oz (66.7 kg)   SpO2 98%   BMI 26.91 kg/m    Physical Exam   No results found for any visits on 03/20/22.    The ASCVD Risk score (Arnett DK, et al., 2019) failed to calculate for the following reasons:   The 2019 ASCVD risk score is only valid for ages 69 to 44    Assessment & Plan:   Problem List Items Addressed This Visit       Cardiovascular and Mediastinum   Venous stasis ulcer of left ankle with varicose veins (HCC) - Primary    I will start her on cephalaxin 500 mg every 6 hour for 7 days. [facility] twice a week.         Genitourinary   CKD (chronic kidney disease), stage III ([place])    Will do cbc and cmp today        Return in about 2 weeks (around 04/03/2022).    Garwin Brothers, MD

## 2022-03-21 LAB — COMPREHENSIVE METABOLIC PANEL
ALT: 10 IU/L (ref 0–32)
AST: 18 IU/L (ref 0–40)
Albumin/Globulin Ratio: 1.1 — ABNORMAL LOW (ref 1.2–2.2)
Albumin: 4.1 g/dL (ref 3.6–4.6)
Alkaline Phosphatase: 71 IU/L (ref 44–121)
BUN/Creatinine Ratio: 20 (ref 12–28)
BUN: 26 mg/dL (ref 10–36)
Bilirubin Total: 0.4 mg/dL (ref 0.0–1.2)
CO2: 22 mmol/L (ref 20–29)
Calcium: 9.3 mg/dL (ref 8.7–10.3)
Chloride: 100 mmol/L (ref 96–106)
Creatinine, Ser: 1.33 mg/dL — ABNORMAL HIGH (ref 0.57–1.00)
Globulin, Total: 3.8 g/dL (ref 1.5–4.5)
Glucose: 81 mg/dL (ref 70–99)
Potassium: 4.9 mmol/L (ref 3.5–5.2)
Sodium: 141 mmol/L (ref 134–144)
Total Protein: 7.9 g/dL (ref 6.0–8.5)
eGFR: 37 mL/min/{1.73_m2} — ABNORMAL LOW (ref >59)

## 2022-03-21 LAB — CBC WITH DIFFERENTIAL/PLATELET
Basophils Absolute: 0.1 10*3/uL (ref 0.0–0.2)
Basos: 1 %
EOS (ABSOLUTE): 0.2 10*3/uL (ref 0.0–0.4)
Eos: 3 %
Hematocrit: 39.4 % (ref 34.0–46.6)
Hemoglobin: 13.2 g/dL (ref 11.1–15.9)
Immature Grans (Abs): 0 10*3/uL (ref 0.0–0.1)
Immature Granulocytes: 0 %
Lymphocytes Absolute: 1.7 10*3/uL (ref 0.7–3.1)
Lymphs: 24 %
MCH: 30.6 pg (ref 26.6–33.0)
MCHC: 33.5 g/dL (ref 31.5–35.7)
MCV: 91 fL (ref 79–97)
Monocytes Absolute: 0.5 10*3/uL (ref 0.1–0.9)
Monocytes: 7 %
Neutrophils Absolute: 4.6 10*3/uL (ref 1.4–7.0)
Neutrophils: 65 %
Platelets: 263 10*3/uL (ref 150–450)
RBC: 4.32 x10E6/uL (ref 3.77–5.28)
RDW: 12 % (ref 11.7–15.4)
WBC: 7.1 10*3/uL (ref 3.4–10.8)

## 2022-03-23 DIAGNOSIS — I87312 Chronic venous hypertension (idiopathic) with ulcer of left lower extremity: Secondary | ICD-10-CM | POA: Diagnosis not present

## 2022-03-23 DIAGNOSIS — I129 Hypertensive chronic kidney disease with stage 1 through stage 4 chronic kidney disease, or unspecified chronic kidney disease: Secondary | ICD-10-CM | POA: Diagnosis not present

## 2022-03-23 DIAGNOSIS — M1990 Unspecified osteoarthritis, unspecified site: Secondary | ICD-10-CM | POA: Diagnosis not present

## 2022-03-23 DIAGNOSIS — L97821 Non-pressure chronic ulcer of other part of left lower leg limited to breakdown of skin: Secondary | ICD-10-CM | POA: Diagnosis not present

## 2022-03-23 DIAGNOSIS — E039 Hypothyroidism, unspecified: Secondary | ICD-10-CM | POA: Diagnosis not present

## 2022-03-23 DIAGNOSIS — N183 Chronic kidney disease, stage 3 unspecified: Secondary | ICD-10-CM | POA: Diagnosis not present

## 2022-03-28 DIAGNOSIS — I83023 Varicose veins of left lower extremity with ulcer of ankle: Secondary | ICD-10-CM | POA: Diagnosis not present

## 2022-04-02 ENCOUNTER — Encounter (INDEPENDENT_AMBULATORY_CARE_PROVIDER_SITE_OTHER): Payer: PPO | Admitting: Ophthalmology

## 2022-04-02 DIAGNOSIS — H43813 Vitreous degeneration, bilateral: Secondary | ICD-10-CM | POA: Diagnosis not present

## 2022-04-02 DIAGNOSIS — I10 Essential (primary) hypertension: Secondary | ICD-10-CM

## 2022-04-02 DIAGNOSIS — H35033 Hypertensive retinopathy, bilateral: Secondary | ICD-10-CM

## 2022-04-02 DIAGNOSIS — H3509 Other intraretinal microvascular abnormalities: Secondary | ICD-10-CM

## 2022-04-17 ENCOUNTER — Encounter: Payer: Self-pay | Admitting: Internal Medicine

## 2022-04-17 ENCOUNTER — Ambulatory Visit: Payer: PPO | Admitting: Internal Medicine

## 2022-04-17 VITALS — BP 126/60 | HR 57 | Temp 97.7°F | Resp 16 | Ht 62.0 in | Wt 151.2 lb

## 2022-04-17 DIAGNOSIS — I10 Essential (primary) hypertension: Secondary | ICD-10-CM | POA: Diagnosis not present

## 2022-04-17 DIAGNOSIS — L97321 Non-pressure chronic ulcer of left ankle limited to breakdown of skin: Secondary | ICD-10-CM | POA: Diagnosis not present

## 2022-04-17 DIAGNOSIS — N1832 Chronic kidney disease, stage 3b: Secondary | ICD-10-CM

## 2022-04-17 DIAGNOSIS — N184 Chronic kidney disease, stage 4 (severe): Secondary | ICD-10-CM

## 2022-04-17 DIAGNOSIS — I83023 Varicose veins of left lower extremity with ulcer of ankle: Secondary | ICD-10-CM | POA: Diagnosis not present

## 2022-04-17 NOTE — Assessment & Plan Note (Addendum)
She says it comes and goes, she tale lasix 40 mg daily. She can take extra fluid tabletfor increase leg edema

## 2022-04-17 NOTE — Progress Notes (Addendum)
Office Visit  Subjective   Patient ID: April Hodges   DOB: 10-31-1925   Age: 89 y.o.   MRN: [id number]   Chief Complaint Chief Complaint  Patient presents with   Follow-up    Hypertension     History of Present Illness 89 years old female is here for follow up. She used [place] boat for 1 month, she says her leg stay swollen even though she take lasix 40 mg daily in the morning, she does not drink more water, she walk all day with her dog and swelling get worse as the day goes on.  She has hypertension and BP is stable.  She has CKD 3a/b and farxiga was added 1 month ago.  She has venous stasis dermatitis and veous ulcer to left leg.   Past Medical History Past Medical History:  Diagnosis Date   Acute on chronic renal failure (HCC) 06/03/2015   Arthritis    Cancer (HCC)    skin cancer   CKD (chronic kidney disease), stage III (HCC)    Demand ischemia    a. 05/2015 in setting of sepsis w/ troponin peak of 8.47;  b. 05/2015 Echo: EF 60-65%, no rwma, Gr1 DD-->No ischemic eval undertaken.   Essential hypertension 06/03/2015   GERD (gastroesophageal reflux disease)    Headache    in younger years   Hypothyroidism    Pneumonia    as a child   PONV (postoperative nausea and vomiting)    Sinus bradycardia    a. states rates in 40's and low 50's are normal for her-->No AVN blocking agents.   Systolic murmur    a. 05/2015 No sigificant valvular dzs on echo.   Urosepsis 06/03/2015     Allergies Allergies  Allergen Reactions   Bystolic [Nebivolol Hcl] Other (See Comments)    Bradycardia - HR in the 40s   Amlodipine Swelling    Coughing as well     Review of Systems Review of Systems  Constitutional: Negative.   Cardiovascular:  Positive for leg swelling.  Gastrointestinal: Negative.        Objective:    Vitals BP 126/60 (BP Location: Left Arm, Patient Position: Sitting, Cuff Size: Normal)   Pulse (!) 57   Temp 97.7 F (36.5 C) (Temporal)   Resp 16   Ht 5\' 2"  (1.575 m)    Wt 151 lb 3.2 oz (68.6 kg)   SpO2 97%   BMI 27.65 kg/m    Physical Examination Physical Exam Constitutional:      Appearance: Normal appearance.  HENT:     Head: Normocephalic and atraumatic.  Cardiovascular:     Rate and Rhythm: Normal rate and regular rhythm.     Heart sounds: Normal heart sounds.  Pulmonary:     Effort: Pulmonary effort is normal.     Breath sounds: Normal breath sounds.  Abdominal:     General: Bowel sounds are normal.     Palpations: Abdomen is soft.  Neurological:     General: No focal deficit present.     Mental Status: She is alert and oriented to person, place, and time.       Assessment & Plan:   Venous stasis ulcer of left ankle with varicose veins (HCC) She says it comes and goes, she tale lasix 40 mg daily. She can take extra fluid tabletfor increase leg edema  Essential hypertension stable  CKD (chronic kidney disease), stage IV (HCC) Will repeat renal function today.    Return in about  1 month (around 05/16/2022).   Eloisa Northern, MD

## 2022-04-17 NOTE — Assessment & Plan Note (Signed)
She is due for labs today

## 2022-04-17 NOTE — Assessment & Plan Note (Signed)
stable °

## 2022-04-18 LAB — CMP14 + ANION GAP
ALT: 11 IU/L (ref 0–32)
AST: 16 IU/L (ref 0–40)
Albumin/Globulin Ratio: 1.1 — ABNORMAL LOW (ref 1.2–2.2)
Albumin: 3.9 g/dL (ref 3.6–4.6)
Alkaline Phosphatase: 73 IU/L (ref 44–121)
Anion Gap: 16 mmol/L (ref 10.0–18.0)
BUN/Creatinine Ratio: 20 (ref 12–28)
BUN: 33 mg/dL (ref 10–36)
Bilirubin Total: 0.3 mg/dL (ref 0.0–1.2)
CO2: 24 mmol/L (ref 20–29)
Calcium: 9.3 mg/dL (ref 8.7–10.3)
Chloride: 99 mmol/L (ref 96–106)
Creatinine, Ser: 1.62 mg/dL — ABNORMAL HIGH (ref 0.57–1.00)
Globulin, Total: 3.7 g/dL (ref 1.5–4.5)
Glucose: 132 mg/dL — ABNORMAL HIGH (ref 70–99)
Potassium: 4.7 mmol/L (ref 3.5–5.2)
Sodium: 139 mmol/L (ref 134–144)
Total Protein: 7.6 g/dL (ref 6.0–8.5)
eGFR: 29 mL/min/{1.73_m2} — ABNORMAL LOW (ref >59)

## 2022-04-19 NOTE — Progress Notes (Signed)
Patient called.

## 2022-04-25 DIAGNOSIS — I129 Hypertensive chronic kidney disease with stage 1 through stage 4 chronic kidney disease, or unspecified chronic kidney disease: Secondary | ICD-10-CM | POA: Diagnosis not present

## 2022-04-25 DIAGNOSIS — L97821 Non-pressure chronic ulcer of other part of left lower leg limited to breakdown of skin: Secondary | ICD-10-CM | POA: Diagnosis not present

## 2022-04-25 DIAGNOSIS — N183 Chronic kidney disease, stage 3 unspecified: Secondary | ICD-10-CM | POA: Diagnosis not present

## 2022-04-25 DIAGNOSIS — M1990 Unspecified osteoarthritis, unspecified site: Secondary | ICD-10-CM | POA: Diagnosis not present

## 2022-04-25 DIAGNOSIS — I87312 Chronic venous hypertension (idiopathic) with ulcer of left lower extremity: Secondary | ICD-10-CM | POA: Diagnosis not present

## 2022-04-25 DIAGNOSIS — E039 Hypothyroidism, unspecified: Secondary | ICD-10-CM | POA: Diagnosis not present

## 2022-05-22 ENCOUNTER — Ambulatory Visit: Payer: HMO | Admitting: Internal Medicine

## 2022-05-22 ENCOUNTER — Encounter: Payer: Self-pay | Admitting: Internal Medicine

## 2022-05-22 VITALS — BP 132/78 | HR 55 | Temp 97.7°F | Resp 18 | Ht 62.0 in | Wt 150.4 lb

## 2022-05-22 DIAGNOSIS — I83023 Varicose veins of left lower extremity with ulcer of ankle: Secondary | ICD-10-CM

## 2022-05-22 DIAGNOSIS — L97321 Non-pressure chronic ulcer of left ankle limited to breakdown of skin: Secondary | ICD-10-CM

## 2022-05-22 DIAGNOSIS — N1832 Chronic kidney disease, stage 3b: Secondary | ICD-10-CM

## 2022-05-22 NOTE — Assessment & Plan Note (Signed)
It is better so will continue to monitor and she will use elastic stocking.

## 2022-05-22 NOTE — Assessment & Plan Note (Signed)
Her gfr was 29, I have suggested to decrease the dose of lasix to 20 mg daily and use elastic stocking.

## 2022-05-22 NOTE — Progress Notes (Signed)
Office Visit  Subjective   Patient ID: April Hodges   DOB: 13-Oct-1925   Age: 89 y.o.   MRN: [id number]   Chief Complaint Chief Complaint  Patient presents with   Follow-up    1 month follow up     History of Present Illness 89 years old female is here for follow up. Her left leg skin breakdown is heeling. She says it is better in the morning but in the evening leg swell up. She has elastic stocking at home but does not use it. She has used [place] boat.  she take lasix 40 mg daily in the morning, she does not drink more water, she walk all day with her dog and swelling get worse as the day goes on.  She has hypertension and BP is stable.  SI have reviewed her labs with her that was drawn 04/17/22 and gfr was 29 but over all been stable. She take farxiga that I will increase to 10 mg daily. She has CKD 3a/b and farxiga was added 1 month ago.  She has venous stasis dermatitis and veous ulcer to left leg.   Past Medical History Past Medical History:  Diagnosis Date   Acute on chronic renal failure ([place]) 06/03/2015   Arthritis    Cancer ([place])    skin cancer   CKD (chronic kidney disease), stage III ([place])    Demand ischemia    a. 05/2015 in setting of sepsis w/ troponin peak of 8.47;  b. 05/2015 Echo: EF 60-65%, no rwma, Gr1 DD-->No ischemic eval undertaken.   Essential hypertension 06/03/2015   GERD (gastroesophageal reflux disease)    Headache    in younger years   Hypothyroidism    Pneumonia    as a child   PONV (postoperative nausea and vomiting)    Sinus bradycardia    a. states rates in 40's and low 50's are normal for her-->No AVN blocking agents.   Systolic murmur    a. [id number] No sigificant valvular dzs on echo.   Urosepsis 06/03/2015     Allergies Allergies  Allergen Reactions   Bystolic [Nebivolol Hcl] Other (See Comments)    Bradycardia - HR in the 40s   Amlodipine Swelling    Coughing as well     Review of Systems Review of Systems  Constitutional: Negative.    HENT: Negative.    Respiratory: Negative.    Cardiovascular: Negative.   Gastrointestinal: Negative.   Neurological: Negative.        Objective:    Vitals BP 132/78 (BP Location: Left Arm, Patient Position: Sitting, Cuff Size: Normal)   Pulse (!) 55   Temp 97.7 F (36.5 C)   Resp 18   Ht '5\' 2"'$  (1.575 m)   Wt 150 lb 6 oz (68.2 kg)   SpO2 97%   BMI 27.50 kg/m    Physical Examination Physical Exam Constitutional:      Appearance: Normal appearance.  HENT:     Head: Normocephalic and atraumatic.  Cardiovascular:     Rate and Rhythm: Normal rate and regular rhythm.     Heart sounds: Normal heart sounds.  Pulmonary:     Effort: Pulmonary effort is normal.     Breath sounds: Normal breath sounds.  Neurological:     General: No focal deficit present.     Mental Status: She is alert and oriented to person, place, and time.        Assessment & Plan:   CKD (chronic kidney disease),  stage III (HCC) Her gfr was 29, I have suggested to decrease the dose of lasix to 20 mg daily and use elastic stocking.   Venous stasis ulcer of left ankle with varicose veins (HCC) It is better so will continue to monitor and she will use elastic stocking.    Return in about 3 months (around 08/22/2022).   Garwin Brothers, MD

## 2022-07-31 ENCOUNTER — Other Ambulatory Visit: Payer: Self-pay | Admitting: Internal Medicine

## 2022-08-28 ENCOUNTER — Ambulatory Visit: Payer: HMO | Admitting: Internal Medicine

## 2022-08-28 ENCOUNTER — Encounter: Payer: Self-pay | Admitting: Internal Medicine

## 2022-08-28 VITALS — BP 122/70 | HR 47 | Temp 97.7°F | Resp 18 | Ht 62.0 in | Wt 155.1 lb

## 2022-08-28 DIAGNOSIS — N184 Chronic kidney disease, stage 4 (severe): Secondary | ICD-10-CM | POA: Insufficient documentation

## 2022-08-28 DIAGNOSIS — S81801A Unspecified open wound, right lower leg, initial encounter: Secondary | ICD-10-CM | POA: Insufficient documentation

## 2022-08-28 DIAGNOSIS — L97929 Non-pressure chronic ulcer of unspecified part of left lower leg with unspecified severity: Secondary | ICD-10-CM

## 2022-08-28 DIAGNOSIS — S81832A Puncture wound without foreign body, left lower leg, initial encounter: Secondary | ICD-10-CM | POA: Diagnosis not present

## 2022-08-28 DIAGNOSIS — I83029 Varicose veins of left lower extremity with ulcer of unspecified site: Secondary | ICD-10-CM

## 2022-08-28 DIAGNOSIS — R600 Localized edema: Secondary | ICD-10-CM | POA: Diagnosis not present

## 2022-08-28 MED ORDER — UNNA-FLEX ELASTIC UNNA BOOT EX MISC: CUTANEOUS | 0 refills | Status: AC

## 2022-08-28 MED ORDER — FUROSEMIDE 40 MG PO TABS: 80.0000 mg | ORAL_TABLET | Freq: Every morning | ORAL | 2 refills | Status: DC

## 2022-08-28 NOTE — Progress Notes (Signed)
   Acute Office Visit  Subjective:     Patient ID: April Hodges, female    DOB: 1925/08/25, 89 y.o.   MRN: [id number]  Chief Complaint  Patient presents with   Follow-up    Stage 3 chronic kidney disease    HPI Patient is in today for worsening of swelling in both legs, she has wound on lateral side of right leg for almost 6 weeks, she also has swelling of of left leg and noticed increase urinary frequency. She also has CKD 4 on last visit draw, She denies any pain she says that wounds are not heeling.  Review of Systems  Respiratory: Negative.          Objective:    BP 122/70 (BP Location: Right Leg, Patient Position: Sitting, Cuff Size: Normal)   Pulse (!) 47   Temp 97.7 F (36.5 C)   Resp 18   Ht 5\' 2"  (1.575 m)   Wt 155 lb 2 oz (70.4 kg)   SpO2 98%   BMI 28.37 kg/m    Physical Exam Cardiovascular:     Rate and Rhythm: Normal rate and regular rhythm.     No results found for any visits on 08/28/22.      Assessment & Plan:   Problem List Items Addressed This Visit       Musculoskeletal and Integument   Venous ulcer of left leg (HCC)    She has significant edema and superficial ulcer around ankle. I will apply unna boat, she will increase lasix also.        Genitourinary   CKD (chronic kidney disease), stage IV (HCC)    I will         Other   Bilateral leg edema - Primary   Relevant Orders   Ambulatory referral to Home Health   Leg wound, right, initial encounter   Relevant Orders   CMP14 + Anion Gap   Ambulatory referral to Home Health    No orders of the defined types were placed in this encounter.   Return in about 1 week (around 09/04/2022).  Eloisa Northern, MD

## 2022-08-28 NOTE — Assessment & Plan Note (Signed)
I will.

## 2022-08-29 DIAGNOSIS — L97929 Non-pressure chronic ulcer of unspecified part of left lower leg with unspecified severity: Secondary | ICD-10-CM | POA: Insufficient documentation

## 2022-08-29 DIAGNOSIS — I83029 Varicose veins of left lower extremity with ulcer of unspecified site: Secondary | ICD-10-CM | POA: Insufficient documentation

## 2022-08-29 NOTE — Assessment & Plan Note (Signed)
She has significant edema and superficial ulcer around ankle. I will apply unna boat, she will increase lasix also.

## 2022-09-04 ENCOUNTER — Ambulatory Visit: Payer: HMO | Admitting: Internal Medicine

## 2022-09-04 ENCOUNTER — Encounter: Payer: Self-pay | Admitting: Internal Medicine

## 2022-09-04 VITALS — BP 124/74 | HR 57 | Temp 98.1°F | Resp 18 | Ht 62.0 in | Wt 153.1 lb

## 2022-09-04 DIAGNOSIS — L97929 Non-pressure chronic ulcer of unspecified part of left lower leg with unspecified severity: Secondary | ICD-10-CM

## 2022-09-04 DIAGNOSIS — I83029 Varicose veins of left lower extremity with ulcer of unspecified site: Secondary | ICD-10-CM | POA: Diagnosis not present

## 2022-09-04 DIAGNOSIS — N184 Chronic kidney disease, stage 4 (severe): Secondary | ICD-10-CM

## 2022-09-04 DIAGNOSIS — R600 Localized edema: Secondary | ICD-10-CM | POA: Diagnosis not present

## 2022-09-17 LAB — CMP14 + ANION GAP
ALT: 10 IU/L (ref 0–32)
AST: 17 IU/L (ref 0–40)
Albumin: 4 g/dL (ref 3.6–4.6)
Alkaline Phosphatase: 79 IU/L (ref 44–121)
Anion Gap: 17 mmol/L (ref 10.0–18.0)
BUN/Creatinine Ratio: 23 (ref 12–28)
BUN: 35 mg/dL (ref 10–36)
Bilirubin Total: 0.3 mg/dL (ref 0.0–1.2)
CO2: 21 mmol/L (ref 20–29)
Calcium: 9.3 mg/dL (ref 8.7–10.3)
Chloride: 99 mmol/L (ref 96–106)
Creatinine, Ser: 1.55 mg/dL — ABNORMAL HIGH (ref 0.57–1.00)
Globulin, Total: 3.7 g/dL (ref 1.5–4.5)
Glucose: 100 mg/dL — ABNORMAL HIGH (ref 70–99)
Potassium: 5.4 mmol/L — ABNORMAL HIGH (ref 3.5–5.2)
Sodium: 137 mmol/L (ref 134–144)
Total Protein: 7.7 g/dL (ref 6.0–8.5)
eGFR: 31 mL/min/{1.73_m2} — ABNORMAL LOW (ref >59)

## 2022-09-20 ENCOUNTER — Ambulatory Visit: Payer: HMO | Admitting: Internal Medicine

## 2022-09-20 ENCOUNTER — Encounter: Payer: Self-pay | Admitting: Internal Medicine

## 2022-09-20 VITALS — BP 122/78 | HR 56 | Temp 97.9°F | Resp 18 | Ht 62.0 in | Wt 152.4 lb

## 2022-09-20 DIAGNOSIS — I872 Venous insufficiency (chronic) (peripheral): Secondary | ICD-10-CM | POA: Diagnosis not present

## 2022-09-20 NOTE — Progress Notes (Signed)
Office Visit  Subjective   Patient ID: April Hodges   DOB: [birth year]   Age: 89 y.o.   MRN: [id number]   Chief Complaint Chief Complaint  Patient presents with   Office visit    Bilateral leg edema     History of Present Illness The patient is a 89 yo female who comes in today with complaints of chornic venous insufficiency.  She states this started about a year ago.  She states her swelling in both legs worsen as the day goes on.  She saw Dr. Nelson Chimes one month ago where she has venous stasis ulcers of both legs with right leg worse then left.  He wanted her to use an UNNA boot but she wrapped her legs instead.   Today, these ulcers are now healed.  She does have some redness and welling of feet up to her knees.  There is no more weeping of her skin in her legs.  She does have stage IV CKD.     Past Medical History Past Medical History:  Diagnosis Date   Acute on chronic renal failure (HCC) 06/03/2015   Arthritis    Cancer (HCC)    skin cancer   CKD (chronic kidney disease), stage III (HCC)    Demand ischemia    a. 05/2015 in setting of sepsis w/ troponin peak of 8.47;  b. 05/2015 Echo: EF 60-65%, no rwma, Gr1 DD-->No ischemic eval undertaken.   Essential hypertension 06/03/2015   GERD (gastroesophageal reflux disease)    Headache    in younger years   Hypothyroidism    Pneumonia    as a child   PONV (postoperative nausea and vomiting)    Sinus bradycardia    a. states rates in 40's and low 50's are normal for her-->No AVN blocking agents.   Systolic murmur    a. 05/2015 No sigificant valvular dzs on echo.   Urosepsis 06/03/2015     Allergies Allergies  Allergen Reactions   Bystolic [Nebivolol Hcl] Other (See Comments)    Bradycardia - HR in the 40s   Amlodipine Swelling    Coughing as well     Medications  Current Outpatient Medications:    acetaminophen (TYLENOL) 500 MG tablet, Take 500 mg by mouth See admin instructions. 500mg  by mouth every morning and 500 mg as  needed for pain, Disp: , Rfl:    diltiazem (CARDIZEM CD) 240 MG 24 hr capsule, Take 1 capsule (240 mg total) by mouth every morning., Disp: 90 capsule, Rfl: 2   FARXIGA 5 MG TABS tablet, Take 1 tablet (5 mg total) by mouth every morning., Disp: 90 tablet, Rfl: 2   furosemide (LASIX) 40 MG tablet, Take 2 tablets (80 mg total) by mouth every morning., Disp: 180 tablet, Rfl: 2   levothyroxine (SYNTHROID) 125 MCG tablet, TAKE 1 TABLET BY MOUTH ONCE DAILY, Disp: 7 tablet, Rfl: 10   losartan (COZAAR) 25 MG tablet, Take 1 tablet (25 mg total) by mouth daily., Disp: 90 tablet, Rfl: 2   Multiple Vitamins-Minerals (MULTIVITAMIN PO), Take 1 tablet by mouth daily., Disp: , Rfl:    omeprazole (PRILOSEC) 10 MG capsule, Take 1 capsule (10 mg total) by mouth daily., Disp: 90 capsule, Rfl: 2   Wound Dressings (UNNA-FLEX ELASTIC UNNA BOOT) MISC, Apply to both legs three time a week to both, Disp: 4 each, Rfl: 0   Review of Systems Review of Systems  Constitutional:  Negative for chills and fever.  Respiratory:  Negative for shortness  of breath.   Cardiovascular:  Positive for leg swelling. Negative for chest pain and palpitations.  Gastrointestinal:  Negative for constipation, diarrhea, nausea and vomiting.  Neurological:  Negative for dizziness, weakness and headaches.       Objective:    Vitals BP 122/78 (BP Location: Left Arm, Patient Position: Sitting, Cuff Size: Normal)   Pulse (!) 56   Temp 97.9 F (36.6 C)   Resp 18   Ht 5\' 2"  (1.575 m)   Wt 152 lb 6 oz (69.1 kg)   SpO2 96%   BMI 27.87 kg/m    Physical Examination Physical Exam Constitutional:      Appearance: Normal appearance. She is not ill-appearing.  Cardiovascular:     Rate and Rhythm: Normal rate and regular rhythm.     Pulses: Normal pulses.     Heart sounds: No murmur heard.    No friction rub. No gallop.  Pulmonary:     Effort: Pulmonary effort is normal. No respiratory distress.     Breath sounds: No wheezing, rhonchi or  rales.  Abdominal:     General: Bowel sounds are normal. There is no distension.     Palpations: Abdomen is soft.     Tenderness: There is no abdominal tenderness.  Musculoskeletal:     Right lower leg: Edema present.     Left lower leg: Edema present.  Skin:    General: Skin is warm and dry.     Findings: No rash.     Comments: She has multiple lesions on her shin and lower legs but these are healing with no open skin wounds/ulcers.  She has swelling 2+ to her bilateral feet and legs with redness over these lesions but no drainage and no increased warmth or pain  Neurological:     Mental Status: She is alert.        Assessment & Plan:   Chronic venous insufficiency Her venous stasis ulcers are now healed.  I want her to avoid salt as she can, elevated her legs and use either compression or ace bandages on for 12 hrs and off 12 hrs at night.    No follow-ups on file.   Crist Fat, MD

## 2022-09-20 NOTE — Assessment & Plan Note (Signed)
Her venous stasis ulcers are now healed.  I want her to avoid salt as she can, elevated her legs and use either compression or ace bandages on for 12 hrs and off 12 hrs at night.

## 2022-09-27 NOTE — Assessment & Plan Note (Signed)
Better  

## 2022-09-27 NOTE — Assessment & Plan Note (Signed)
I will decrease the dose of lasix and will monitor renal function, she is not interested in taking SGL2 for kidney protection.

## 2022-09-27 NOTE — Progress Notes (Signed)
   Office Visit  Subjective   Patient ID: April Hodges   DOB: 28-Sep-1925   Age: 89 y.o.   MRN: [id number]   Chief Complaint Chief Complaint  Patient presents with   Follow-up    1 week follow up     History of Present Illness 89 years old female is here for follow up. Staff here applied unna boot and her is much better and venous ulcer is healing nicely. She has been taking lasix and her CKD is worse with gfr 29 that actually make her CKD4. I have decrease the dose of lasix and will repeat renal function in 2 weeks.  She otherwise is feeling better.  Past Medical History Past Medical History:  Diagnosis Date   Acute on chronic renal failure (HCC) 06/03/2015   Arthritis    Cancer (HCC)    skin cancer   CKD (chronic kidney disease), stage III (HCC)    Demand ischemia    a. 05/2015 in setting of sepsis w/ troponin peak of 8.47;  b. 05/2015 Echo: EF 60-65%, no rwma, Gr1 DD-->No ischemic eval undertaken.   Essential hypertension 06/03/2015   GERD (gastroesophageal reflux disease)    Headache    in younger years   Hypothyroidism    Pneumonia    as a child   PONV (postoperative nausea and vomiting)    Sinus bradycardia    a. states rates in 40's and low 50's are normal for her-->No AVN blocking agents.   Systolic murmur    a. 05/2015 No sigificant valvular dzs on echo.   Urosepsis 06/03/2015     Allergies Allergies  Allergen Reactions   Bystolic [Nebivolol Hcl] Other (See Comments)    Bradycardia - HR in the 40s   Amlodipine Swelling    Coughing as well     Review of Systems Review of Systems  Constitutional: Negative.   Respiratory: Negative.    Cardiovascular:  Positive for leg swelling.       Objective:    Vitals BP 124/74 (BP Location: Left Arm, Patient Position: Sitting, Cuff Size: Normal)   Pulse (!) 57   Temp 98.1 F (36.7 C)   Resp 18   Ht 5\' 2"  (1.575 m)   Wt 153 lb 2 oz (69.5 kg)   SpO2 97%   BMI 28.01 kg/m    Physical Examination Physical  Exam Constitutional:      Appearance: Normal appearance.  Cardiovascular:     Rate and Rhythm: Normal rate and regular rhythm.  Pulmonary:     Effort: Pulmonary effort is normal.     Breath sounds: Normal breath sounds.  Musculoskeletal:     Right lower leg: Edema present.     Left lower leg: Edema present.  Neurological:     General: No focal deficit present.     Mental Status: She is alert and oriented to person, place, and time.        Assessment & Plan:   CKD (chronic kidney disease), stage IV (HCC) I will decrease the dose of lasix and will monitor renal function, she is not interested in taking SGL2 for kidney protection.  Bilateral leg edema Better  Venous ulcer of left leg (HCC) Better    No follow-ups on file.   Eloisa Northern, MD

## 2022-10-01 ENCOUNTER — Encounter (INDEPENDENT_AMBULATORY_CARE_PROVIDER_SITE_OTHER): Payer: HMO | Admitting: Ophthalmology

## 2022-10-01 DIAGNOSIS — I10 Essential (primary) hypertension: Secondary | ICD-10-CM

## 2022-10-01 DIAGNOSIS — H35033 Hypertensive retinopathy, bilateral: Secondary | ICD-10-CM | POA: Diagnosis not present

## 2022-10-01 DIAGNOSIS — H43813 Vitreous degeneration, bilateral: Secondary | ICD-10-CM

## 2022-10-01 DIAGNOSIS — H3500 Unspecified background retinopathy: Secondary | ICD-10-CM | POA: Diagnosis not present

## 2022-10-31 NOTE — Assessment & Plan Note (Signed)
Will repeat renal function today.

## 2022-11-27 ENCOUNTER — Encounter: Payer: Self-pay | Admitting: Internal Medicine

## 2022-11-27 ENCOUNTER — Ambulatory Visit: Payer: HMO | Admitting: Internal Medicine

## 2022-11-27 VITALS — BP 118/72 | HR 60 | Resp 18 | Ht 62.0 in | Wt 149.0 lb

## 2022-11-27 DIAGNOSIS — R600 Localized edema: Secondary | ICD-10-CM

## 2022-11-27 DIAGNOSIS — L97929 Non-pressure chronic ulcer of unspecified part of left lower leg with unspecified severity: Secondary | ICD-10-CM

## 2022-11-27 DIAGNOSIS — L97919 Non-pressure chronic ulcer of unspecified part of right lower leg with unspecified severity: Secondary | ICD-10-CM | POA: Diagnosis not present

## 2022-11-27 DIAGNOSIS — I83029 Varicose veins of left lower extremity with ulcer of unspecified site: Secondary | ICD-10-CM

## 2022-11-27 DIAGNOSIS — I83019 Varicose veins of right lower extremity with ulcer of unspecified site: Secondary | ICD-10-CM | POA: Diagnosis not present

## 2022-11-27 DIAGNOSIS — S81801A Unspecified open wound, right lower leg, initial encounter: Secondary | ICD-10-CM

## 2022-11-27 DIAGNOSIS — S81802A Unspecified open wound, left lower leg, initial encounter: Secondary | ICD-10-CM

## 2022-11-27 NOTE — Progress Notes (Unsigned)
Office Visit  Subjective   Patient ID: April Hodges   DOB: 06/26/1925   Age: 89 y.o.   MRN: [id number]   Chief Complaint Chief Complaint  Patient presents with   office visit    Leg pain     History of Present Illness HPI   Past Medical History Past Medical History:  Diagnosis Date   Acute on chronic renal failure (HCC) 06/03/2015   Arthritis    Cancer (HCC)    skin cancer   CKD (chronic kidney disease), stage III (HCC)    Demand ischemia    a. 05/2015 in setting of sepsis w/ troponin peak of 8.47;  b. 05/2015 Echo: EF 60-65%, no rwma, Gr1 DD-->No ischemic eval undertaken.   Essential hypertension 06/03/2015   GERD (gastroesophageal reflux disease)    Headache    in younger years   Hypothyroidism    Pneumonia    as a child   PONV (postoperative nausea and vomiting)    Sinus bradycardia    a. states rates in 40's and low 50's are normal for her-->No AVN blocking agents.   Systolic murmur    a. 05/2015 No sigificant valvular dzs on echo.   Urosepsis 06/03/2015     Allergies Allergies  Allergen Reactions   Bystolic [Nebivolol Hcl] Other (See Comments)    Bradycardia - HR in the 40s   Amlodipine Swelling    Coughing as well     Review of Systems ROS     Objective:    Vitals BP 118/72 (BP Location: Left Arm, Patient Position: Sitting, Cuff Size: Normal)   Pulse 60   Resp 18   Ht 5\' 2"  (1.575 m)   Wt 149 lb (67.6 kg)   SpO2 97%   BMI 27.25 kg/m    Physical Examination Physical Exam     Assessment & Plan:   No problem-specific Assessment & Plan notes found for this encounter.    No follow-ups on file.   Eloisa Northern, MD

## 2022-11-28 MED ORDER — CEPHALEXIN 500 MG PO CAPS: 500.0000 mg | ORAL_CAPSULE | Freq: Four times a day (QID) | ORAL | 0 refills | Status: AC

## 2022-11-28 NOTE — Assessment & Plan Note (Signed)
I will send home health nurse to apply to apply unna boat to both legs.

## 2022-11-28 NOTE — Assessment & Plan Note (Signed)
I will start her on cephalexin and will send home health nurse to apply Unna boot to both legs to changed twice a week.

## 2022-11-30 DIAGNOSIS — L97911 Non-pressure chronic ulcer of unspecified part of right lower leg limited to breakdown of skin: Secondary | ICD-10-CM | POA: Diagnosis not present

## 2022-11-30 DIAGNOSIS — I83018 Varicose veins of right lower extremity with ulcer other part of lower leg: Secondary | ICD-10-CM | POA: Diagnosis not present

## 2022-11-30 DIAGNOSIS — L97921 Non-pressure chronic ulcer of unspecified part of left lower leg limited to breakdown of skin: Secondary | ICD-10-CM | POA: Diagnosis not present

## 2022-11-30 DIAGNOSIS — I83028 Varicose veins of left lower extremity with ulcer other part of lower leg: Secondary | ICD-10-CM | POA: Diagnosis not present

## 2022-12-04 ENCOUNTER — Other Ambulatory Visit: Payer: Self-pay

## 2022-12-04 ENCOUNTER — Other Ambulatory Visit: Payer: Self-pay | Admitting: Internal Medicine

## 2022-12-04 MED ORDER — FARXIGA 5 MG PO TABS: 5.0000 mg | ORAL_TABLET | Freq: Every morning | ORAL | 2 refills | Status: DC

## 2023-01-01 ENCOUNTER — Ambulatory Visit: Payer: HMO | Admitting: Internal Medicine

## 2023-01-01 VITALS — BP 120/60 | HR 53 | Temp 98.3°F | Resp 18 | Ht 62.0 in | Wt 149.0 lb

## 2023-01-01 DIAGNOSIS — I83019 Varicose veins of right lower extremity with ulcer of unspecified site: Secondary | ICD-10-CM

## 2023-01-01 DIAGNOSIS — E039 Hypothyroidism, unspecified: Secondary | ICD-10-CM

## 2023-01-01 DIAGNOSIS — I872 Venous insufficiency (chronic) (peripheral): Secondary | ICD-10-CM

## 2023-01-01 DIAGNOSIS — I83029 Varicose veins of left lower extremity with ulcer of unspecified site: Secondary | ICD-10-CM

## 2023-01-01 DIAGNOSIS — L97929 Non-pressure chronic ulcer of unspecified part of left lower leg with unspecified severity: Secondary | ICD-10-CM

## 2023-01-01 DIAGNOSIS — N184 Chronic kidney disease, stage 4 (severe): Secondary | ICD-10-CM

## 2023-01-01 DIAGNOSIS — L97919 Non-pressure chronic ulcer of unspecified part of right lower leg with unspecified severity: Secondary | ICD-10-CM

## 2023-01-01 MED ORDER — TRIAMCINOLONE ACETONIDE 0.1 % EX CREA: TOPICAL_CREAM | Freq: Two times a day (BID) | CUTANEOUS | Status: DC

## 2023-01-01 MED ORDER — FUROSEMIDE 40 MG PO TABS: 80.0000 mg | ORAL_TABLET | Freq: Every morning | ORAL | 2 refills | Status: DC

## 2023-01-01 NOTE — Assessment & Plan Note (Signed)
I will start her on triamcinolone and Eucerin cream to reduce itching.  She will call if there is any signs of infection.

## 2023-01-01 NOTE — Assessment & Plan Note (Signed)
She will continue to use furosemide as needed.

## 2023-01-01 NOTE — Assessment & Plan Note (Signed)
Her CKD 3B, she will come on next Tuesday for CMP.

## 2023-01-01 NOTE — Assessment & Plan Note (Signed)
I will do a TSH next week.

## 2023-01-01 NOTE — Progress Notes (Signed)
Office Visit  Subjective   Patient ID: April Hodges   DOB: [birth year]   Age: 89 y.o.   MRN: [id number]   Chief Complaint Chief Complaint  Patient presents with   office visit    Checked for edema     History of Present Illness 89 years old female who is here for follow-up for rash in ulcers in her legs.  I have started her on antibiotic because there was drainage from the wound and started her on Unna boot.  She used that for 2 months.  She says that home health nurse stopped coming last week.  She is using homemade elastic stocking.  She denies any pain.  She said that wound have healed except there are few areas of bleeding.  She says that it itch a lot. She has chronic kidney disease stage IIIb and her GFR is was 31 in June.  She is currently on Farxiga 5 mg daily. She has hypertension and her blood pressure is well-controlled.  Past Medical History Past Medical History:  Diagnosis Date   Acute on chronic renal failure (HCC) 06/03/2015   Arthritis    Cancer (HCC)    skin cancer   CKD (chronic kidney disease), stage III (HCC)    Demand ischemia    a. 05/2015 in setting of sepsis w/ troponin peak of 8.47;  b. 05/2015 Echo: EF 60-65%, no rwma, Gr1 DD-->No ischemic eval undertaken.   Essential hypertension 06/03/2015   GERD (gastroesophageal reflux disease)    Headache    in younger years   Hypothyroidism    Pneumonia    as a child   PONV (postoperative nausea and vomiting)    Sinus bradycardia    a. states rates in 40's and low 50's are normal for her-->No AVN blocking agents.   Systolic murmur    a. 05/2015 No sigificant valvular dzs on echo.   Urosepsis 06/03/2015     Allergies Allergies  Allergen Reactions   Bystolic [Nebivolol Hcl] Other (See Comments)    Bradycardia - HR in the 40s   Amlodipine Swelling    Coughing as well     Review of Systems Review of Systems  Constitutional: Negative.   Respiratory: Negative.    Cardiovascular: Negative.    Gastrointestinal: Negative.        Objective:    Vitals BP 120/60 (BP Location: Left Arm, Patient Position: Sitting, Cuff Size: Normal)   Pulse (!) 53   Temp 98.3 F (36.8 C)   Resp 18   Ht 5\' 2"  (1.575 m)   Wt 149 lb (67.6 kg)   SpO2 97%   BMI 27.25 kg/m    Physical Examination Physical Exam Constitutional:      Appearance: Normal appearance.  HENT:     Head: Normocephalic and atraumatic.  Cardiovascular:     Rate and Rhythm: Normal rate and regular rhythm.     Heart sounds: Normal heart sounds.  Pulmonary:     Effort: Pulmonary effort is normal.     Breath sounds: Normal breath sounds.  Musculoskeletal:     Right lower leg: Edema present.     Comments: Dermatitis is much better looking.  Skin:    Findings: Rash present.  Neurological:     General: No focal deficit present.     Mental Status: She is alert and oriented to person, place, and time.        Assessment & Plan:   Chronic venous insufficiency She will continue to use  furosemide as needed.  Venous ulcer of both lower extremities with varicose veins (HCC) I will start her on triamcinolone and Eucerin cream to reduce itching.  She will call if there is any signs of infection.  CKD (chronic kidney disease), stage IV (HCC) Her CKD 3B, she will come on next Tuesday for CMP.  Hypothyroidism (acquired) I will do a TSH next week.    Return in about 3 months (around 04/03/2023).   Eloisa Northern, MD

## 2023-01-07 ENCOUNTER — Other Ambulatory Visit: Payer: Self-pay

## 2023-01-07 MED ORDER — TRIAMCINOLONE ACETONIDE 0.1 % EX CREA: TOPICAL_CREAM | Freq: Two times a day (BID) | CUTANEOUS | 0 refills | Status: DC

## 2023-01-07 MED ORDER — TRIAMCINOLONE ACETONIDE 0.1 % EX CREA: TOPICAL_CREAM | Freq: Two times a day (BID) | CUTANEOUS | Status: DC

## 2023-01-08 ENCOUNTER — Ambulatory Visit: Payer: HMO | Admitting: Internal Medicine

## 2023-01-08 ENCOUNTER — Other Ambulatory Visit: Payer: Self-pay | Admitting: Internal Medicine

## 2023-01-08 DIAGNOSIS — E039 Hypothyroidism, unspecified: Secondary | ICD-10-CM | POA: Diagnosis not present

## 2023-01-08 NOTE — Progress Notes (Signed)
Nurse visit Blood draw  CMP, TSH

## 2023-01-09 LAB — COMPREHENSIVE METABOLIC PANEL
ALT: 10 [IU]/L (ref 0–32)
AST: 14 [IU]/L (ref 0–40)
Albumin: 4 g/dL (ref 3.6–4.6)
Alkaline Phosphatase: 74 [IU]/L (ref 44–121)
BUN/Creatinine Ratio: 23 (ref 12–28)
BUN: 44 mg/dL — ABNORMAL HIGH (ref 10–36)
Bilirubin Total: 0.4 mg/dL (ref 0.0–1.2)
CO2: 20 mmol/L (ref 20–29)
Calcium: 9.6 mg/dL (ref 8.7–10.3)
Chloride: 99 mmol/L (ref 96–106)
Creatinine, Ser: 1.88 mg/dL — ABNORMAL HIGH (ref 0.57–1.00)
Globulin, Total: 3.9 g/dL (ref 1.5–4.5)
Glucose: 156 mg/dL — ABNORMAL HIGH (ref 70–99)
Potassium: 4.7 mmol/L (ref 3.5–5.2)
Sodium: 140 mmol/L (ref 134–144)
Total Protein: 7.9 g/dL (ref 6.0–8.5)
eGFR: 24 mL/min/{1.73_m2} — ABNORMAL LOW (ref >59)

## 2023-01-09 LAB — TSH: TSH: 2.08 u[IU]/mL (ref 0.450–4.500)

## 2023-01-17 ENCOUNTER — Other Ambulatory Visit: Payer: Self-pay

## 2023-04-02 ENCOUNTER — Encounter: Payer: Self-pay | Admitting: Internal Medicine

## 2023-04-02 ENCOUNTER — Ambulatory Visit: Payer: HMO | Admitting: Internal Medicine

## 2023-04-02 VITALS — BP 122/60 | HR 55 | Temp 97.7°F | Resp 18 | Ht 63.0 in | Wt 151.5 lb

## 2023-04-02 DIAGNOSIS — E039 Hypothyroidism, unspecified: Secondary | ICD-10-CM | POA: Diagnosis not present

## 2023-04-02 DIAGNOSIS — I872 Venous insufficiency (chronic) (peripheral): Secondary | ICD-10-CM

## 2023-04-02 DIAGNOSIS — N184 Chronic kidney disease, stage 4 (severe): Secondary | ICD-10-CM | POA: Diagnosis not present

## 2023-04-02 DIAGNOSIS — I10 Essential (primary) hypertension: Secondary | ICD-10-CM

## 2023-04-02 DIAGNOSIS — R600 Localized edema: Secondary | ICD-10-CM

## 2023-04-02 NOTE — Addendum Note (Signed)
Addended byEloisa Northern on: 04/02/2023 02:48 PM   Modules accepted: Orders

## 2023-04-02 NOTE — Progress Notes (Addendum)
Office Visit  Subjective   Patient ID: April Hodges   DOB: 10-Aug-1925   Age: 89 y.o.   MRN: [id number]   Chief Complaint Chief Complaint  Patient presents with   Follow-up    3 month     History of Present Illness 89 years old female who is here for follow-up for follow up. She has not used Radio broadcast assistant for 2 months. Her swelling is back to normal except right leg is more swollen than left. She still take 2 lasix daily. I have reviewed her labs with her. Her GFR was 28, that make her CKD4. She walk with dog and denies any SOB. No chest pain. She is currently on Farxiga 5 mg daily.  She has hypertension and her blood pressure is well-controlled.  She has hypothyroidism and she is clinically does not have any symptoms of hypo or hyperthyroidism.   Past Medical History Past Medical History:  Diagnosis Date   Acute on chronic renal failure (HCC) 06/03/2015   Arthritis    Cancer (HCC)    skin cancer   CKD (chronic kidney disease), stage III (HCC)    Demand ischemia (HCC)    a. 05/2015 in setting of sepsis w/ troponin peak of 8.47;  b. 05/2015 Echo: EF 60-65%, no rwma, Gr1 DD-->No ischemic eval undertaken.   Essential hypertension 06/03/2015   GERD (gastroesophageal reflux disease)    Headache    in younger years   Hypothyroidism    Pneumonia    as a child   PONV (postoperative nausea and vomiting)    Sinus bradycardia    a. states rates in 40's and low 50's are normal for her-->No AVN blocking agents.   Systolic murmur    a. 05/2015 No sigificant valvular dzs on echo.   Urosepsis 06/03/2015     Allergies Allergies  Allergen Reactions   Bystolic [Nebivolol Hcl] Other (See Comments)    Bradycardia - HR in the 40s   Amlodipine Swelling    Coughing as well     Review of Systems Review of Systems  Constitutional: Negative.   HENT: Negative.    Respiratory: Negative.    Cardiovascular: Negative.   Gastrointestinal: Negative.   Neurological: Negative.        Objective:     Vitals BP 122/60 (BP Location: Left Arm, Patient Position: Sitting, Cuff Size: Normal)   Pulse (!) 55   Temp 97.7 F (36.5 C)   Resp 18   Ht 5\' 3"  (1.6 m)   Wt 151 lb 8 oz (68.7 kg)   SpO2 97%   BMI 26.84 kg/m    Physical Examination Physical Exam Constitutional:      Appearance: Normal appearance.  HENT:     Head: Normocephalic and atraumatic.  Cardiovascular:     Rate and Rhythm: Normal rate and regular rhythm.     Heart sounds: Normal heart sounds.  Pulmonary:     Effort: Pulmonary effort is normal.     Breath sounds: Normal breath sounds.  Abdominal:     General: Bowel sounds are normal.     Palpations: Abdomen is soft.  Neurological:     General: No focal deficit present.     Mental Status: She is alert and oriented to person, place, and time.        Assessment & Plan:   Chronic venous insufficiency stable  Essential hypertension Well controlled.  Hypothyroidism (acquired) Her TSH was therapeutic.   CKD (chronic kidney disease), stage IV (HCC) I  will repeat labs on Thursday.  Bilateral leg edema She does not have swelling  she will cut back to 1 tablet of lasix daily.     Return in about 2 months (around 05/31/2023).   Eloisa Northern, MD

## 2023-04-02 NOTE — Assessment & Plan Note (Signed)
Her TSH was therapeutic.

## 2023-04-02 NOTE — Assessment & Plan Note (Signed)
Well controlled 

## 2023-04-02 NOTE — Assessment & Plan Note (Signed)
stable °

## 2023-04-02 NOTE — Assessment & Plan Note (Addendum)
She does not have swelling  she will cut back to 1 tablet of lasix daily.

## 2023-04-02 NOTE — Assessment & Plan Note (Signed)
I will repeat labs on Thursday.

## 2023-04-04 ENCOUNTER — Ambulatory Visit: Payer: HMO

## 2023-04-09 ENCOUNTER — Ambulatory Visit: Payer: HMO

## 2023-06-04 ENCOUNTER — Encounter: Payer: Self-pay | Admitting: Internal Medicine

## 2023-06-04 ENCOUNTER — Ambulatory Visit: Payer: HMO | Admitting: Internal Medicine

## 2023-06-04 ENCOUNTER — Other Ambulatory Visit: Payer: Self-pay | Admitting: Internal Medicine

## 2023-06-04 VITALS — BP 122/68 | HR 56 | Temp 97.2°F | Resp 18 | Ht 63.0 in | Wt 146.0 lb

## 2023-06-04 DIAGNOSIS — I83029 Varicose veins of left lower extremity with ulcer of unspecified site: Secondary | ICD-10-CM

## 2023-06-04 DIAGNOSIS — N184 Chronic kidney disease, stage 4 (severe): Secondary | ICD-10-CM

## 2023-06-04 DIAGNOSIS — I10 Essential (primary) hypertension: Secondary | ICD-10-CM

## 2023-06-04 DIAGNOSIS — L97929 Non-pressure chronic ulcer of unspecified part of left lower leg with unspecified severity: Secondary | ICD-10-CM

## 2023-06-04 DIAGNOSIS — I83019 Varicose veins of right lower extremity with ulcer of unspecified site: Secondary | ICD-10-CM

## 2023-06-04 DIAGNOSIS — E039 Hypothyroidism, unspecified: Secondary | ICD-10-CM

## 2023-06-04 DIAGNOSIS — L97919 Non-pressure chronic ulcer of unspecified part of right lower leg with unspecified severity: Secondary | ICD-10-CM

## 2023-06-04 NOTE — Assessment & Plan Note (Signed)
 controlled

## 2023-06-04 NOTE — Assessment & Plan Note (Signed)
 Her thyroid function was therapeutic.

## 2023-06-04 NOTE — Assessment & Plan Note (Signed)
 I have suggested unna boat but she does not want, she says that she is feeling fine and I have suggested to think over it and if she change her mind then she can tell

## 2023-06-04 NOTE — Progress Notes (Signed)
 Office Visit  Subjective   Patient ID: April Hodges   DOB: 1926-01-19   Age: 89 y.o.   MRN: [id number]   Chief Complaint Chief Complaint  Patient presents with   Follow-up    2 month follow up     History of Present Illness 89 years old female who is here for follow-up for follow up. She says that she walk every day, she does not hurt. She still have multiple superficial open areas to both legs, I have suggested  Unna boot but she does not want to do that. She says that she is feeling fine and it does not bother her. I have discussed with her that open areas can get infected. She says that her leg swell at the end of day, but it does not hurt. She takes lasix 40 mg daily. She has stage IV CKD. She does not want to see kidney doctor. She walk with dog and denies any SOB. No chest pain. She is currently on Farxiga 5 mg daily.   She has hypertension and her blood pressure is well-controlled.   She has hypothyroidism and her labs were drawn 4 months ago showed, therapeutic TSH. level.  she is on 125 mcg daily levothyroxine.    Past Medical History Past Medical History:  Diagnosis Date   Acute on chronic renal failure (HCC) 06/03/2015   Arthritis    Cancer (HCC)    skin cancer   CKD (chronic kidney disease), stage III (HCC)    Demand ischemia (HCC)    a. 05/2015 in setting of sepsis w/ troponin peak of 8.47;  b. 05/2015 Echo: EF 60-65%, no rwma, Gr1 DD-->No ischemic eval undertaken.   Essential hypertension 06/03/2015   GERD (gastroesophageal reflux disease)    Headache    in younger years   Hypothyroidism    Pneumonia    as a child   PONV (postoperative nausea and vomiting)    Sinus bradycardia    a. states rates in 40's and low 50's are normal for her-->No AVN blocking agents.   Systolic murmur    a. 05/2015 No sigificant valvular dzs on echo.   Urosepsis 06/03/2015     Allergies Allergies  Allergen Reactions   Bystolic [Nebivolol Hcl] Other (See Comments)    Bradycardia -  HR in the 40s   Amlodipine Swelling    Coughing as well     Review of Systems Review of Systems  Constitutional: Negative.   HENT: Negative.    Respiratory: Negative.    Cardiovascular: Negative.   Gastrointestinal: Negative.   Neurological: Negative.        Objective:    Vitals BP 122/68 (BP Location: Left Arm, Patient Position: Sitting)   Pulse (!) 56   Temp (!) 97.2 F (36.2 C)   Resp 18   Ht 5\' 3"  (1.6 m)   Wt 146 lb (66.2 kg)   SpO2 96%   BMI 25.86 kg/m    Physical Examination Physical Exam Constitutional:      Appearance: Normal appearance.  HENT:     Head: Normocephalic and atraumatic.  Cardiovascular:     Rate and Rhythm: Normal rate and regular rhythm.     Heart sounds: Normal heart sounds.  Pulmonary:     Effort: Pulmonary effort is normal.     Breath sounds: Normal breath sounds.  Abdominal:     General: Bowel sounds are normal.     Palpations: Abdomen is soft.  Neurological:     General:  No focal deficit present.     Mental Status: She is alert and oriented to person, place, and time.        Assessment & Plan:   Essential hypertension controlled  Venous ulcer of both lower extremities with varicose veins (HCC) I have suggested unna boat but she does not want, she says that she is feeling fine and I have suggested to think over it and if she change her mind then she can tell  Hypothyroidism (acquired) Her thyroid function was therapeutic.  CKD (chronic kidney disease), stage IV (HCC) I will repeat renal function, she does not want to see nephrologist.    Return in about 2 months (around 08/04/2023).   Eloisa Northern, MD

## 2023-06-04 NOTE — Assessment & Plan Note (Signed)
 I will repeat renal function, she does not want to see nephrologist.

## 2023-06-05 LAB — COMPREHENSIVE METABOLIC PANEL
ALT: 10 IU/L (ref 0–32)
AST: 18 IU/L (ref 0–40)
Albumin: 4.3 g/dL (ref 3.6–4.6)
Alkaline Phosphatase: 74 IU/L (ref 44–121)
BUN/Creatinine Ratio: 22 (ref 12–28)
BUN: 42 mg/dL — ABNORMAL HIGH (ref 10–36)
Bilirubin Total: 0.4 mg/dL (ref 0.0–1.2)
CO2: 24 mmol/L (ref 20–29)
Calcium: 9.7 mg/dL (ref 8.7–10.3)
Chloride: 96 mmol/L (ref 96–106)
Creatinine, Ser: 1.95 mg/dL — ABNORMAL HIGH (ref 0.57–1.00)
Globulin, Total: 3.4 g/dL (ref 1.5–4.5)
Glucose: 80 mg/dL (ref 70–99)
Potassium: 5.1 mmol/L (ref 3.5–5.2)
Sodium: 136 mmol/L (ref 134–144)
Total Protein: 7.7 g/dL (ref 6.0–8.5)
eGFR: 23 mL/min/{1.73_m2} — ABNORMAL LOW (ref >59)

## 2023-06-05 NOTE — Progress Notes (Signed)
 Patient called.  Patient aware. Pliease tell her that renal function is same as before so will monitor.

## 2023-07-17 ENCOUNTER — Other Ambulatory Visit: Payer: Self-pay

## 2023-07-17 MED ORDER — LEVOTHYROXINE SODIUM 125 MCG PO TABS: 125.0000 ug | ORAL_TABLET | Freq: Every day | ORAL | 10 refills | Status: AC

## 2023-08-06 ENCOUNTER — Other Ambulatory Visit: Payer: Self-pay | Admitting: Internal Medicine

## 2023-08-06 ENCOUNTER — Ambulatory Visit: Admitting: Internal Medicine

## 2023-08-06 ENCOUNTER — Encounter: Payer: Self-pay | Admitting: Internal Medicine

## 2023-08-06 VITALS — BP 126/60 | HR 54 | Temp 97.6°F | Resp 18 | Ht 63.0 in | Wt 148.2 lb

## 2023-08-06 DIAGNOSIS — E039 Hypothyroidism, unspecified: Secondary | ICD-10-CM

## 2023-08-06 DIAGNOSIS — F01B0 Vascular dementia, moderate, without behavioral disturbance, psychotic disturbance, mood disturbance, and anxiety: Secondary | ICD-10-CM | POA: Insufficient documentation

## 2023-08-06 DIAGNOSIS — N184 Chronic kidney disease, stage 4 (severe): Secondary | ICD-10-CM

## 2023-08-06 DIAGNOSIS — E782 Mixed hyperlipidemia: Secondary | ICD-10-CM

## 2023-08-06 DIAGNOSIS — L97919 Non-pressure chronic ulcer of unspecified part of right lower leg with unspecified severity: Secondary | ICD-10-CM

## 2023-08-06 DIAGNOSIS — R600 Localized edema: Secondary | ICD-10-CM

## 2023-08-06 DIAGNOSIS — I83029 Varicose veins of left lower extremity with ulcer of unspecified site: Secondary | ICD-10-CM

## 2023-08-06 DIAGNOSIS — I83019 Varicose veins of right lower extremity with ulcer of unspecified site: Secondary | ICD-10-CM

## 2023-08-06 DIAGNOSIS — I10 Essential (primary) hypertension: Secondary | ICD-10-CM | POA: Diagnosis not present

## 2023-08-06 DIAGNOSIS — L97929 Non-pressure chronic ulcer of unspecified part of left lower leg with unspecified severity: Secondary | ICD-10-CM

## 2023-08-06 NOTE — Assessment & Plan Note (Signed)
 She will continue to take Farxiga  5 mg daily and I will repeat lipid panel today.

## 2023-08-06 NOTE — Progress Notes (Signed)
 Office Visit  Subjective   Patient ID: April Hodges   DOB: 1925/09/02   Age: 89 y.o.   MRN: [id number]   Chief Complaint Chief Complaint  Patient presents with   Follow-up    2 month follow up     History of Present Illness 89 years old female who is here for follow-up.  She denies any complaint.  She has a swelling in her legs that is better in the morning but get worse as the day goes along.  She has a venous ulcer on her right leg that has almost healed.  She takes Lasix  40 mg in the morning she also has elastic stocking that she used to take but she said that as swelling is not bad bad so she has not been using it.   She has chronic kidney disease and her GFR was 27 on last blood draw that make keep chronic kidney disease stage IV.  She did not wanted to see a nephrologist as she feels that she has a good life and she is happy with the way things are.  He takes Farxiga  5 mg daily.    She is able to carry out all activities of daily living without any problem.  She comes ou t to the dining room in the evening for dinner. She has hypertension and her blood pressure is controlled.  She also has hypothyroidism.  She takes levothyroxine  125 mcg daily.  She is due for TSH today.  Past Medical History Past Medical History:  Diagnosis Date   Acute on chronic renal failure (HCC) 06/03/2015   Arthritis    Cancer (HCC)    skin cancer   CKD (chronic kidney disease), stage III (HCC)    Demand ischemia (HCC)    a. 05/2015 in setting of sepsis w/ troponin peak of 8.47;  b. 05/2015 Echo: EF 60-65%, no rwma, Gr1 DD-->No ischemic eval undertaken.   Essential hypertension 06/03/2015   GERD (gastroesophageal reflux disease)    Headache    in younger years   Hypothyroidism    Pneumonia    as a child   PONV (postoperative nausea and vomiting)    Sinus bradycardia    a. states rates in 40's and low 50's are normal for her-->No AVN blocking agents.   Systolic murmur    a. 05/2015 No sigificant  valvular dzs on echo.   Urosepsis 06/03/2015     Allergies Allergies  Allergen Reactions   Bystolic  [Nebivolol  Hcl] Other (See Comments)    Bradycardia - HR in the 40s   Amlodipine  Swelling    Coughing as well     Review of Systems Review of Systems  Constitutional: Negative.   HENT: Negative.    Respiratory: Negative.    Cardiovascular: Negative.   Gastrointestinal: Negative.        Objective:    Vitals BP 126/60   Pulse (!) 54   Temp 97.6 F (36.4 C)   Resp 18   Ht 5\' 3"  (1.6 m)   Wt 148 lb 4 oz (67.2 kg)   SpO2 95%   BMI 26.26 kg/m    Physical Examination Physical Exam Constitutional:      Appearance: Normal appearance.  HENT:     Head: Normocephalic and atraumatic.  Cardiovascular:     Rate and Rhythm: Normal rate.     Heart sounds: Normal heart sounds.  Pulmonary:     Effort: Pulmonary effort is normal.     Breath sounds: Normal breath  sounds.  Abdominal:     General: Bowel sounds are normal.     Palpations: Abdomen is soft.  Neurological:     General: No focal deficit present.     Mental Status: She is alert and oriented to person, place, and time.        Assessment & Plan:   Venous ulcer of both lower extremities with varicose veins (HCC) Her wound in both lower legs have almost healed.  Hypothyroidism (acquired) Clinically she does not have any symptoms of hypo or hyperthyroidism.  She is on levothyroxine  125 mcg daily.  I will repeat TSH today.  CKD (chronic kidney disease), stage IV (HCC) She will continue to take Farxiga  5 mg daily and I will repeat lipid panel today.  Essential hypertension Her blood pressure is well-controlled.    Return in about 2 months (around 10/06/2023).   Tita Form, MD

## 2023-08-06 NOTE — Assessment & Plan Note (Signed)
 Her wound in both lower legs have almost healed.

## 2023-08-06 NOTE — Assessment & Plan Note (Signed)
 Clinically she does not have any symptoms of hypo or hyperthyroidism.  She is on levothyroxine  125 mcg daily.  I will repeat TSH today.

## 2023-08-06 NOTE — Assessment & Plan Note (Signed)
 Her blood pressure is well controlled.

## 2023-08-07 LAB — COMPREHENSIVE METABOLIC PANEL WITH GFR
ALT: 12 IU/L (ref 0–32)
AST: 17 IU/L (ref 0–40)
Albumin: 4.1 g/dL (ref 3.6–4.6)
Alkaline Phosphatase: 76 IU/L (ref 44–121)
BUN/Creatinine Ratio: 31 — ABNORMAL HIGH (ref 12–28)
BUN: 55 mg/dL — ABNORMAL HIGH (ref 10–36)
Bilirubin Total: 0.4 mg/dL (ref 0.0–1.2)
CO2: 21 mmol/L (ref 20–29)
Calcium: 9.4 mg/dL (ref 8.7–10.3)
Chloride: 100 mmol/L (ref 96–106)
Creatinine, Ser: 1.76 mg/dL — ABNORMAL HIGH (ref 0.57–1.00)
Globulin, Total: 3.8 g/dL (ref 1.5–4.5)
Glucose: 86 mg/dL (ref 70–99)
Potassium: 5 mmol/L (ref 3.5–5.2)
Sodium: 138 mmol/L (ref 134–144)
Total Protein: 7.9 g/dL (ref 6.0–8.5)
eGFR: 26 mL/min/{1.73_m2} — ABNORMAL LOW (ref >59)

## 2023-08-07 LAB — TSH: TSH: 1.67 u[IU]/mL (ref 0.450–4.500)

## 2023-08-15 ENCOUNTER — Ambulatory Visit: Payer: Self-pay

## 2023-08-15 NOTE — Progress Notes (Signed)
 Patient called.  Patient aware. Please let her know that her labs are stable

## 2023-08-21 IMAGING — CT CT CHEST W/O CM
1 of 2 series · 15 of 32 positions shown, 19 images · non-contrast
Comparison: 02/02/2020, 07/12/2018

CLINICAL DATA: Lung nodule, asymptomatic, follow-up



[Series 6: super d · axial · 0.67mm/px · z∈[-367,-93]mm · 15 of 384 slices shown, 19 images]
[im 21/384  mediastinal]
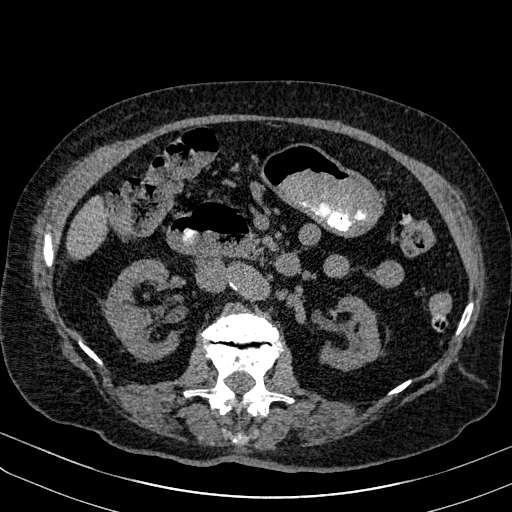
[im 21/384  lung]
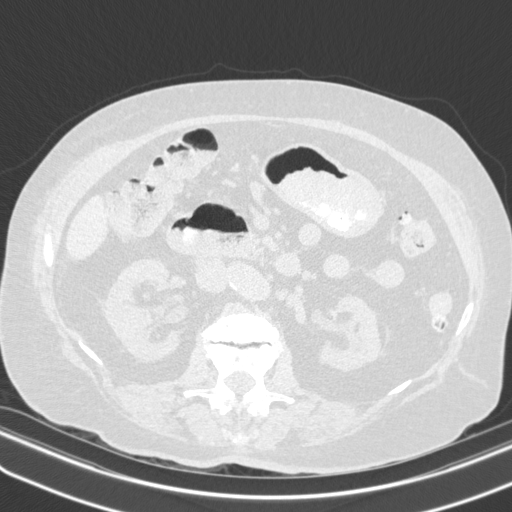
[im 61/384  lung]
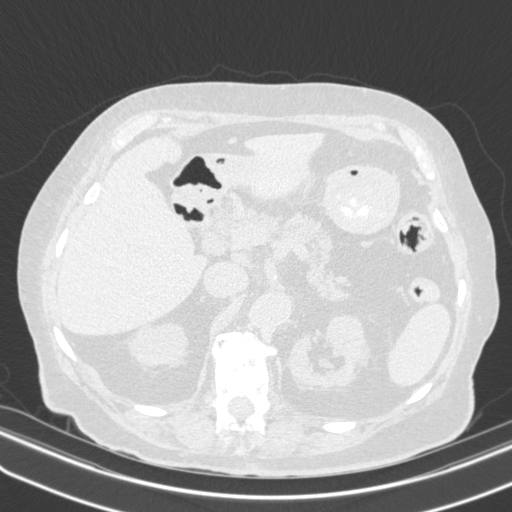
[im 81/384  lung]
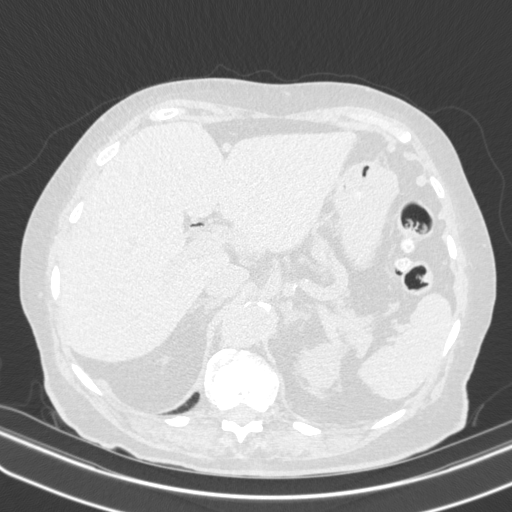
[im 101/384  lung]
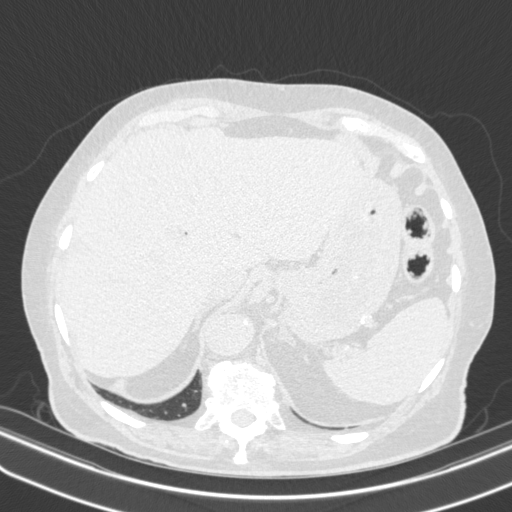
[im 128/384  mediastinal]
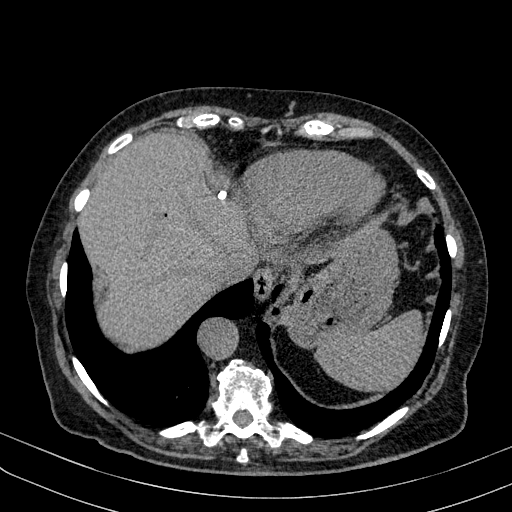
[im 128/384  lung]
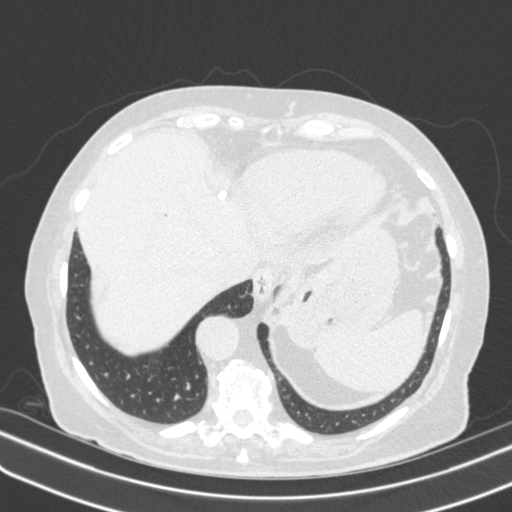
[im 142/384  lung]
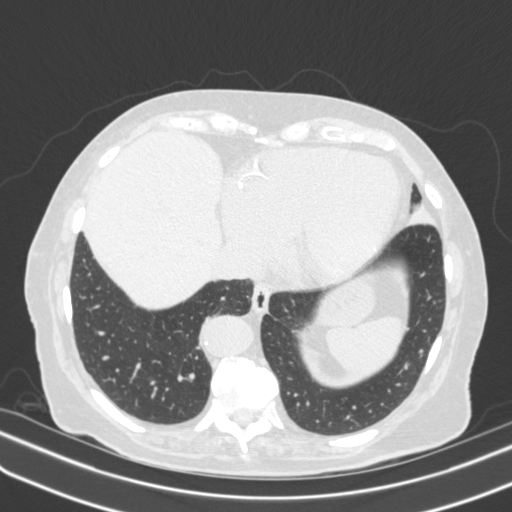
[im 180/384  lung]
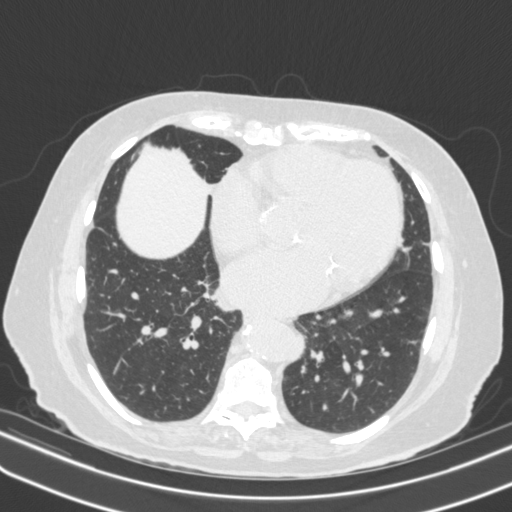
[im 182/384  lung]
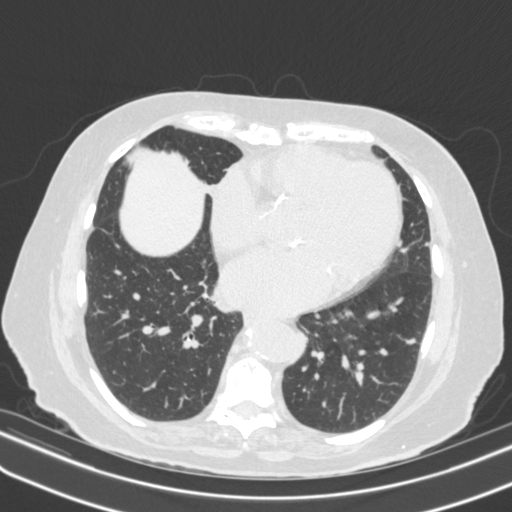
[im 202/384  mediastinal]
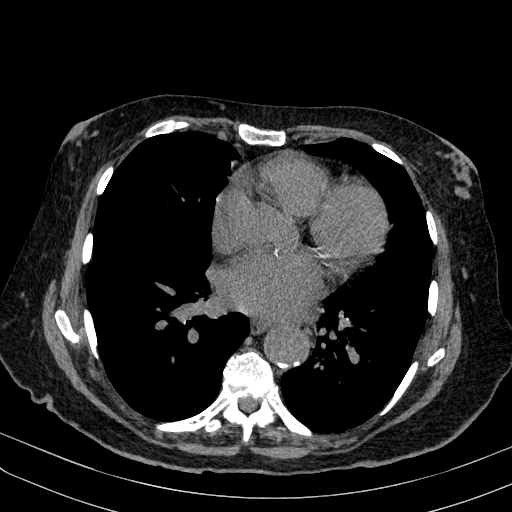
[im 202/384  lung]
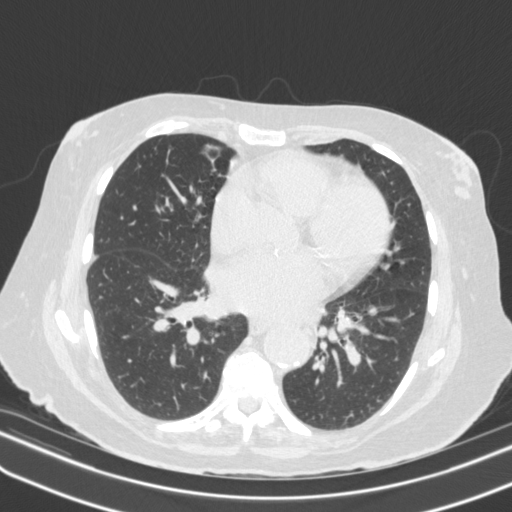
[im 242/384  lung]
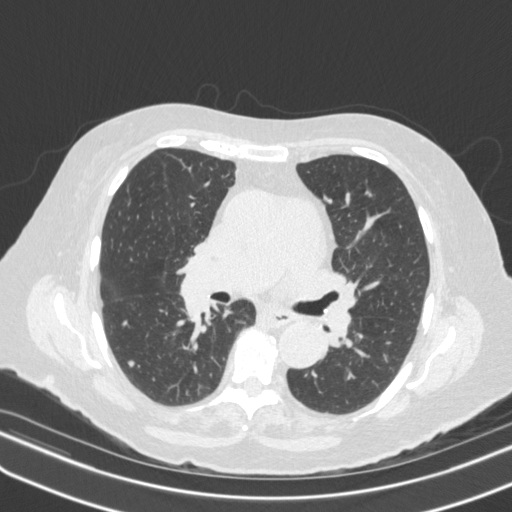
[im 256/384  lung]
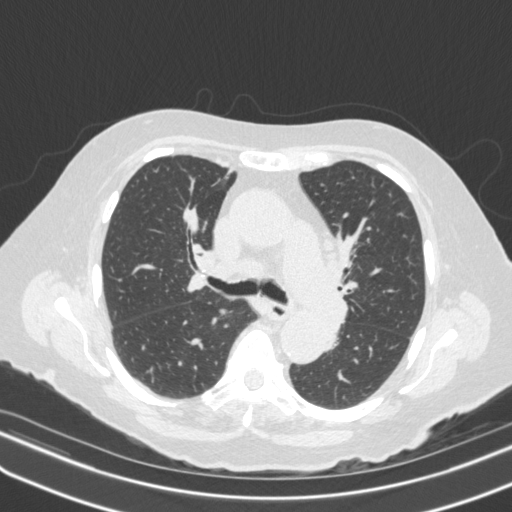
[im 283/384  lung]
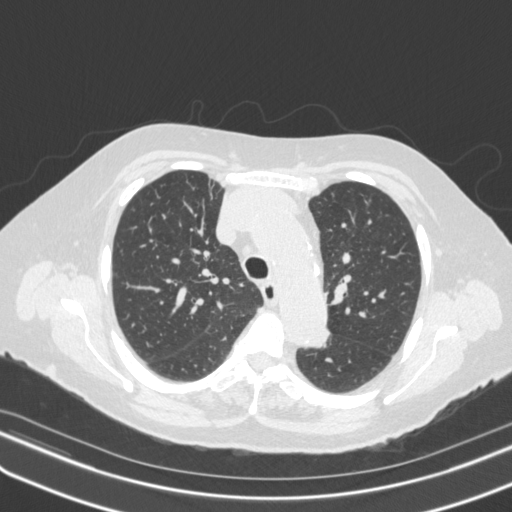
[im 303/384  mediastinal]
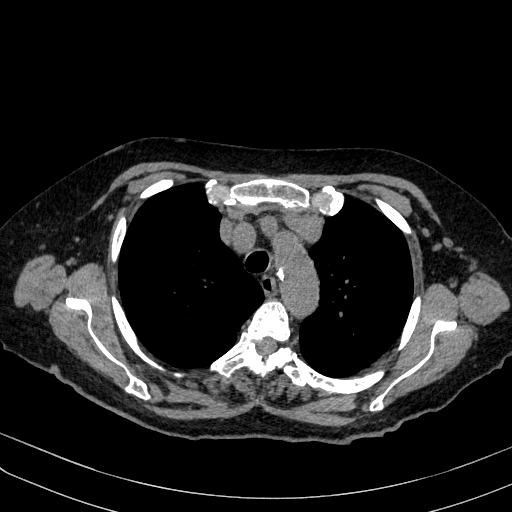
[im 303/384  lung]
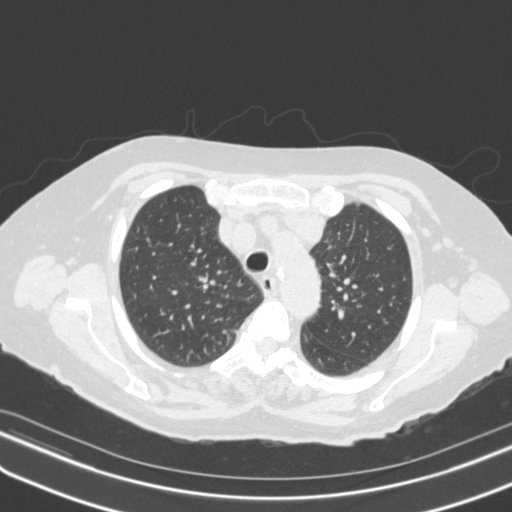
[im 323/384  lung]
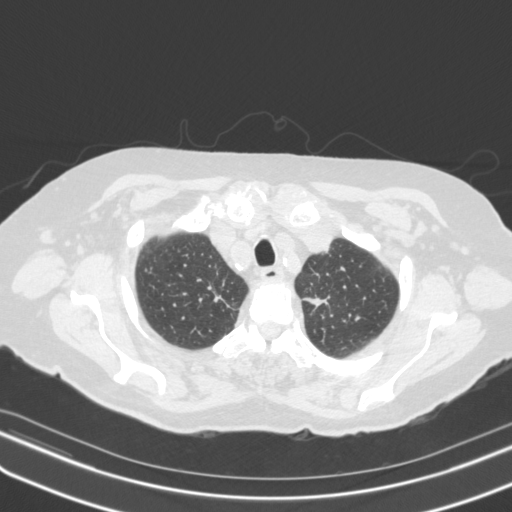
[im 363/384  lung]
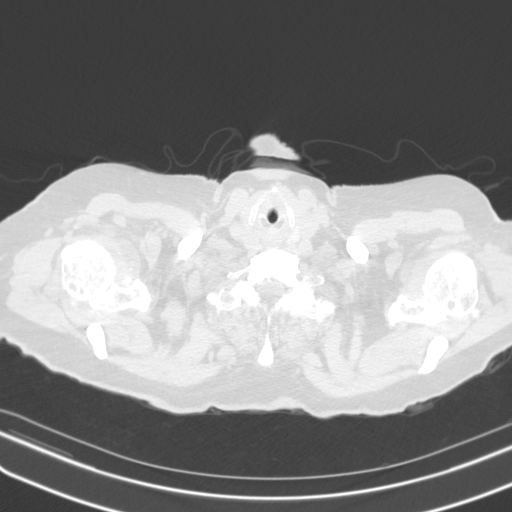

[15 of 32 positions shown; findings below may reference images not displayed]

FINDINGS: Cardiovascular: Atherosclerotic calcifications aorta, proximal great
vessels, and coronary arteries. Aorta normal caliber. Cardiac
chambers enlarged. Mitral annular calcification. No pericardial
effusion.

Mediastinum/Nodes: Esophagus unremarkable. Base of cervical region
normal appearance. Normal sized axillary and mediastinal lymph
nodes. No thoracic adenopathy.

Lungs/Pleura: Calcified granulomata RIGHT lung. Subsegmental
atelectasis medial base of RIGHT middle lobe and at base of lingula.
3 mm RIGHT lower lobe nodule image 94 unchanged since 2020. RIGHT
lower lobe nodule 8 mm image 78 unchanged. Biapical scarring. LEFT
lower lobe nodule 6 mm image 80 unchanged since 2020. LEFT lower
lobe nodule 3 mm image 59 not significantly changed. No pulmonary
infiltrate, pleural effusion, pneumothorax, or new mass/nodule. No
endobronchial lesions seen.

Upper Abdomen: Pneumobilia unchanged question prior ERCP with
sphincterotomy. Large duodenal diverticulum. Colonic diverticulosis.
Post cholecystectomy.

Musculoskeletal: Degenerative disc disease changes thoracic spine.
IMPRESSION: Multiple pulmonary nodules unchanged since 2020; this is indicative
of benign behavior and no follow-up imaging recommended.

Scattered atherosclerotic calcifications including coronary
arteries.

Pneumobilia unchanged likely prior ERCP with sphincterotomy.

Colonic diverticulosis.

Aortic Atherosclerosis ([id number]-[id number].R).

## 2023-08-23 ENCOUNTER — Other Ambulatory Visit: Payer: Self-pay

## 2023-08-23 MED ORDER — FARXIGA 5 MG PO TABS: 5.0000 mg | ORAL_TABLET | Freq: Every morning | ORAL | 2 refills | Status: AC

## 2023-09-10 ENCOUNTER — Ambulatory Visit: Admitting: Internal Medicine

## 2023-09-10 ENCOUNTER — Encounter: Payer: Self-pay | Admitting: Internal Medicine

## 2023-09-10 VITALS — BP 122/70 | HR 54 | Temp 98.3°F | Resp 18 | Wt 148.0 lb

## 2023-09-10 DIAGNOSIS — R600 Localized edema: Secondary | ICD-10-CM

## 2023-09-10 DIAGNOSIS — I83019 Varicose veins of right lower extremity with ulcer of unspecified site: Secondary | ICD-10-CM | POA: Diagnosis not present

## 2023-09-10 DIAGNOSIS — L97919 Non-pressure chronic ulcer of unspecified part of right lower leg with unspecified severity: Secondary | ICD-10-CM | POA: Diagnosis not present

## 2023-09-10 NOTE — Progress Notes (Unsigned)
   Acute Office Visit  Subjective:     Patient ID: April Hodges, female    DOB: March 14, 1925, 89 y.o.   MRN: [id number]  Chief Complaint  Patient presents with  . office visit    Right legs starting to split , leaving open wounds    HPI Patient is in today for ***  ROS      Objective:    BP 122/70   Pulse (!) 54   Temp 98.3 F (36.8 C)   Resp 18   Wt 148 lb (67.1 kg)   SpO2 95%   BMI 26.22 kg/m  {Vitals History (Optional):23777}  Physical Exam  No results found for any visits on 09/10/23.      Assessment & Plan:   Problem List Items Addressed This Visit   None   No orders of the defined types were placed in this encounter.   No follow-ups on file.  [name], MD

## 2023-09-11 NOTE — Assessment & Plan Note (Signed)
 I have cleaned the wound and Ace bandage was applied.  I will get home health nurse to apply Unna boot twice a week.

## 2023-09-15 DIAGNOSIS — L97819 Non-pressure chronic ulcer of other part of right lower leg with unspecified severity: Secondary | ICD-10-CM | POA: Diagnosis not present

## 2023-09-15 DIAGNOSIS — R21 Rash and other nonspecific skin eruption: Secondary | ICD-10-CM | POA: Diagnosis not present

## 2023-09-15 DIAGNOSIS — R609 Edema, unspecified: Secondary | ICD-10-CM | POA: Diagnosis not present

## 2023-09-15 DIAGNOSIS — I872 Venous insufficiency (chronic) (peripheral): Secondary | ICD-10-CM | POA: Diagnosis not present

## 2023-09-16 ENCOUNTER — Encounter (HOSPITAL_BASED_OUTPATIENT_CLINIC_OR_DEPARTMENT_OTHER): Payer: Self-pay

## 2023-09-16 ENCOUNTER — Other Ambulatory Visit: Payer: Self-pay

## 2023-09-16 ENCOUNTER — Emergency Department (HOSPITAL_BASED_OUTPATIENT_CLINIC_OR_DEPARTMENT_OTHER)
Admission: EM | Admit: 2023-09-16 | Discharge: 2023-09-16 | Disposition: A | Discharge status: Home / Self Care | Attending: Student | Admitting: Student

## 2023-09-16 ENCOUNTER — Emergency Department (HOSPITAL_BASED_OUTPATIENT_CLINIC_OR_DEPARTMENT_OTHER)

## 2023-09-16 DIAGNOSIS — I129 Hypertensive chronic kidney disease with stage 1 through stage 4 chronic kidney disease, or unspecified chronic kidney disease: Secondary | ICD-10-CM | POA: Insufficient documentation

## 2023-09-16 DIAGNOSIS — N390 Urinary tract infection, site not specified: Secondary | ICD-10-CM | POA: Diagnosis not present

## 2023-09-16 DIAGNOSIS — R1031 Right lower quadrant pain: Secondary | ICD-10-CM | POA: Diagnosis present

## 2023-09-16 DIAGNOSIS — N179 Acute kidney failure, unspecified: Secondary | ICD-10-CM | POA: Insufficient documentation

## 2023-09-16 DIAGNOSIS — N189 Chronic kidney disease, unspecified: Secondary | ICD-10-CM | POA: Insufficient documentation

## 2023-09-16 DIAGNOSIS — R1030 Lower abdominal pain, unspecified: Secondary | ICD-10-CM

## 2023-09-16 DIAGNOSIS — R001 Bradycardia, unspecified: Secondary | ICD-10-CM | POA: Diagnosis not present

## 2023-09-16 DIAGNOSIS — Z79899 Other long term (current) drug therapy: Secondary | ICD-10-CM | POA: Diagnosis not present

## 2023-09-16 LAB — URINALYSIS, ROUTINE W REFLEX MICROSCOPIC
Bilirubin Urine: NEGATIVE
Glucose, UA: 100 mg/dL — AB
Hgb urine dipstick: NEGATIVE
Ketones, ur: NEGATIVE mg/dL
Nitrite: NEGATIVE
Protein, ur: NEGATIVE mg/dL
Specific Gravity, Urine: 1.013 (ref 1.005–1.030)
pH: 5 (ref 5.0–8.0)

## 2023-09-16 LAB — COMPREHENSIVE METABOLIC PANEL WITH GFR
ALT: 42 U/L (ref 0–44)
AST: 51 U/L — ABNORMAL HIGH (ref 15–41)
Albumin: 4.1 g/dL (ref 3.5–5.0)
Alkaline Phosphatase: 95 U/L (ref 38–126)
Anion gap: 18 — ABNORMAL HIGH (ref 5–15)
BUN: 44 mg/dL — ABNORMAL HIGH (ref 8–23)
CO2: 19 mmol/L — ABNORMAL LOW (ref 22–32)
Calcium: 9.7 mg/dL (ref 8.9–10.3)
Chloride: 98 mmol/L (ref 98–111)
Creatinine, Ser: 2.29 mg/dL — ABNORMAL HIGH (ref 0.44–1.00)
GFR, Estimated: 19 mL/min — ABNORMAL LOW
Glucose, Bld: 141 mg/dL — ABNORMAL HIGH (ref 70–99)
Potassium: 4.5 mmol/L (ref 3.5–5.1)
Sodium: 135 mmol/L (ref 135–145)
Total Bilirubin: 0.4 mg/dL (ref 0.0–1.2)
Total Protein: 8.6 g/dL — ABNORMAL HIGH (ref 6.5–8.1)

## 2023-09-16 LAB — CBC
HCT: 40.8 % (ref 36.0–46.0)
Hemoglobin: 13.5 g/dL (ref 12.0–15.0)
MCH: 31.4 pg (ref 26.0–34.0)
MCHC: 33.1 g/dL (ref 30.0–36.0)
MCV: 94.9 fL (ref 80.0–100.0)
Platelets: 270 K/uL (ref 150–400)
RBC: 4.3 MIL/uL (ref 3.87–5.11)
RDW: 13.4 % (ref 11.5–15.5)
WBC: 9.1 K/uL (ref 4.0–10.5)
nRBC: 0 % (ref 0.0–0.2)

## 2023-09-16 LAB — LIPASE, BLOOD: Lipase: 31 U/L (ref 11–51)

## 2023-09-16 MED ORDER — ONDANSETRON 4 MG PO TBDP
4.0000 mg | ORAL_TABLET | Freq: Once | ORAL | Status: DC | PRN
  Filled 2023-09-16: qty 1

## 2023-09-16 MED ORDER — SODIUM CHLORIDE 0.9 % IV SOLN
1.0000 g | Freq: Once | INTRAVENOUS | Status: AC
  Administered 2023-09-16: 1 g via INTRAVENOUS
  Filled 2023-09-16: qty 10

## 2023-09-16 MED ORDER — ONDANSETRON 4 MG PO TBDP
4.0000 mg | ORAL_TABLET | Freq: Three times a day (TID) | ORAL | 0 refills | Status: AC | PRN
Start: 2023-09-16 — End: ?

## 2023-09-16 MED ORDER — SODIUM CHLORIDE 0.9 % IV BOLUS
500.0000 mL | Freq: Once | INTRAVENOUS | Status: AC
  Administered 2023-09-16: 500 mL via INTRAVENOUS

## 2023-09-16 MED ORDER — ONDANSETRON HCL 4 MG/2ML IJ SOLN
4.0000 mg | Freq: Once | INTRAMUSCULAR | Status: AC
  Administered 2023-09-16: 4 mg via INTRAVENOUS
  Filled 2023-09-16: qty 2

## 2023-09-16 MED ORDER — CEPHALEXIN 500 MG PO CAPS: 500.0000 mg | ORAL_CAPSULE | Freq: Two times a day (BID) | ORAL | 0 refills | Status: AC

## 2023-09-16 NOTE — ED Notes (Signed)
 Spoke with lab about urine culture add on.

## 2023-09-16 NOTE — ED Notes (Signed)
 Pt given saltine crackers and water to PO challenge at this time.

## 2023-09-16 NOTE — ED Notes (Signed)
 Pt d/c instructions, medications, and follow-up care reviewed with pt and pt's son. Pt and pt's son verbalized understanding and had no further questions at time of d/c. Pt CA&Ox4 in NAD at time of d/c

## 2023-09-16 NOTE — ED Provider Notes (Signed)
 [place] [facility] AT [facility] Provider Note   CSN: [id number] Arrival date & time: 09/16/23  1552     Patient presents with: Abdominal Pain and Nausea   April Hodges is a 89 y.o. female.   89 year old female presents with her son with concern for lower abdominal pain and severe nausea/dry heaves, unable to vomit. No changes in bowel or bladder habits, no fevers/chills. Prior abdominal surgeries include cholecystectomy, hysterectomy.        Prior to Admission medications   Medication Sig Start Date End Date Taking? Authorizing Provider  cephALEXin  (KEFLEX ) 500 MG capsule Take 1 capsule (500 mg total) by mouth 2 (two) times daily for 5 days. 09/16/23 09/21/23 Yes Beverley Leita LABOR, PA-C  ondansetron  (ZOFRAN -ODT) 4 MG disintegrating tablet Take 1 tablet (4 mg total) by mouth every 8 (eight) hours as needed for nausea or vomiting. 09/16/23  Yes Beverley Leita LABOR, PA-C  acetaminophen  (TYLENOL ) 500 MG tablet Take 500 mg by mouth See admin instructions. 500mg  by mouth every morning and 500 mg as needed for pain    [provider]  diltiazem  (CARDIZEM  CD) 240 MG 24 hr capsule TAKE 1 CAPSULE BY MOUTH EVERY MORNING *DO NOT CRUSH OR CHEW* 12/04/22   Caleen Dirks, MD  FARXIGA  5 MG TABS tablet Take 1 tablet (5 mg total) by mouth every morning. 08/23/23   Amin, Saad, MD  furosemide  (LASIX ) 40 MG tablet Take 2 tablets (80 mg total) by mouth every morning. 01/01/23   Amin, Saad, MD  levothyroxine  (SYNTHROID ) 125 MCG tablet Take 1 tablet (125 mcg total) by mouth daily. 07/17/23   Amin, Saad, MD  losartan  (COZAAR ) 25 MG tablet TAKE 1 TABLET BY MOUTH ONCE DAILY 12/04/22   Amin, Saad, MD  Multiple Vitamins-Minerals (MULTIVITAMIN PO) Take 1 tablet by mouth daily.    [provider]  omeprazole  (PRILOSEC) 10 MG capsule TAKE 1 CAPSULE BY MOUTH ONCE DAILY *TAKE ON AN EMPTY STOMACH* *DO NOT CRUSH OR CHEW* 12/04/22   Caleen Dirks, MD  triamcinolone  0.1%-Eucerin equivalent 1:1 cream  mixture Apply topically 2 (two) times daily. 01/07/23   Amin, Saad, MD  Wound Dressings (UNNA-FLEX ELASTIC NONIE SEAMAN) MISC Apply to both legs three time a week to both 08/28/22   Amin, Saad, MD    Allergies: Bystolic  [nebivolol  hcl] and Amlodipine     Review of Systems Negative except as per HPI  Updated Vital Signs BP 122/63   Pulse (!) 44   Temp 97.7 F (36.5 C) (Oral)   Resp 18   SpO2 93%   Physical Exam Vitals and nursing note reviewed.  Constitutional:      General: She is not in acute distress.    Appearance: She is well-developed. She is not diaphoretic.  HENT:     Head: Normocephalic and atraumatic.  Cardiovascular:     Rate and Rhythm: Regular rhythm. Bradycardia present.     Heart sounds: Normal heart sounds.  Pulmonary:     Effort: Pulmonary effort is normal.  Abdominal:     Palpations: Abdomen is soft.     Tenderness: There is abdominal tenderness in the right lower quadrant, suprapubic area and left lower quadrant.  Skin:    General: Skin is warm and dry.     Findings: No erythema or rash.  Neurological:     Mental Status: She is alert and oriented to person, place, and time.  Psychiatric:        Behavior: Behavior normal.     (  all labs ordered are listed, but only abnormal results are displayed) Labs Reviewed  COMPREHENSIVE METABOLIC PANEL WITH GFR - Abnormal; Notable for the following components:      Result Value   CO2 19 (*)    Glucose, Bld 141 (*)    BUN 44 (*)    Creatinine, Ser 2.29 (*)    Total Protein 8.6 (*)    AST 51 (*)    GFR, Estimated 19 (*)    Anion gap 18 (*)    All other components within normal limits  URINALYSIS, ROUTINE W REFLEX MICROSCOPIC - Abnormal; Notable for the following components:   Glucose, UA 100 (*)    Leukocytes,Ua MODERATE (*)    Bacteria, UA MANY (*)    All other components within normal limits  URINE CULTURE  LIPASE, BLOOD  CBC    EKG: None  Radiology: CT ABDOMEN PELVIS WO CONTRAST Result Date:  09/16/2023 CLINICAL DATA:  Abdominal pain. EXAM: CT ABDOMEN AND PELVIS WITHOUT CONTRAST TECHNIQUE: Multidetector CT imaging of the abdomen and pelvis was performed following the standard protocol without IV contrast. RADIATION DOSE REDUCTION: This exam was performed according to the departmental dose-optimization program which includes automated exposure control, adjustment of the mA and/or kV according to patient size and/or use of iterative reconstruction technique. COMPARISON:  CT abdomen pelvis dated 07/12/2018. FINDINGS: Evaluation of this exam is limited in the absence of intravenous contrast. Lower chest: Small bilateral pleural effusions. There is mild diffuse interstitial coarsening. There is a 4 mm nodule at the left lung base. Coronary vascular calcification. No intra-abdominal free air or free fluid. Hepatobiliary: The liver is unremarkable. No biliary dilatation. Cholecystectomy. Pancreas: Unremarkable. No pancreatic ductal dilatation or surrounding inflammatory changes. Spleen: Normal in size without focal abnormality. Adrenals/Urinary Tract: The adrenal glands unremarkable. Moderate bilateral renal parenchyma atrophy. There is no hydronephrosis or nephrolithiasis on either side. The visualized ureters and urinary bladder appear unremarkable. Stomach/Bowel: There is a 3.5 cm duodenal diverticulum. There is sigmoid diverticulosis and diffuse colonic diverticula. There is no bowel obstruction or active inflammation. The appendix is normal. Vascular/Lymphatic: Mild aortoiliac atherosclerotic disease the IVC is unremarkable. No portal venous gas. There is no adenopathy. Reproductive: Hysterectomy.  No suspicious adnexal masses. Other: None Musculoskeletal: Osteopenia with degenerative changes of the spine. No acute osseous pathology. IMPRESSION: 1. No acute intra-abdominal or pelvic pathology. 2. Colonic diverticulosis. No bowel obstruction. Normal appendix. 3. Small bilateral pleural effusions. 4.  Aortic  Atherosclerosis (ICD10-I70.0). Electronically Signed   By: Vanetta Chou M.D.   On: 09/16/2023 18:22     Procedures   Medications Ordered in the ED  ondansetron  (ZOFRAN ) injection 4 mg (4 mg Intravenous Given 09/16/23 1634)  sodium chloride  0.9 % bolus 500 mL (0 mLs Intravenous Stopped 09/16/23 1928)  cefTRIAXone  (ROCEPHIN ) 1 g in sodium chloride  0.9 % 100 mL IVPB (0 g Intravenous Stopped 09/16/23 2214)                                    Medical Decision Making Amount and/or Complexity of Data Reviewed Labs: ordered. Radiology: ordered.  Risk Prescription drug management.   This patient presents to the ED for concern of lower abdominal pain, nausea, this involves an extensive number of treatment options, and is a complaint that carries with it a high risk of complications and morbidity.  The differential diagnosis includes but not limited to diverticulitis, SBO, mass, volvulus, UTI  Co morbidities / Chronic conditions that complicate the patient evaluation  GERD, sinus bradycardia, acute on chronic renal failure, hypertension   Additional history obtained:  Additional history obtained from EMR Son at bedside who assists with history External records from outside source obtained and reviewed including prior labs on file   Lab Tests:  I Ordered, and personally interpreted labs.  The pertinent results include: CBC within normal meds.  CMP with increased creatinine to 2.29, previously 1.82-month ago.  Mild metabolic acidosis with bicarb of 19 and gap of 18.  Lipase within normal limits.  Urinalysis suggestive of infection with moderate leukocytes, 6-10 WBC many bacteria, will send for culture   Imaging Studies ordered:  I ordered imaging studies including CT abdomen pelvis without contrast I independently visualized and interpreted imaging which showed no acute process I agree with the radiologist interpretation   Problem List / ED Course / Critical interventions /  Medication management  89 year old female presents with son from independent living facility with complaint of lower abdominal pain and nausea with dry heaves.  On initial exam, appears very uncomfortable, dry heaving with lower abdominal tenderness to palpation.  Labs concerning for AKI, patient provided with IV fluids and ultimately tolerating p.o. fluids.  She is found to have a urinary tract infection.  CT without acute changes.  Offered admission however patient has declined and would like to be discharged.  Patient's son plans to stay with her tonight.  Will add on urine culture, provided with IV Rocephin  while in the ER and prescription for Keflex  sent to her local pharmacy.  Provided with strict return to ER precautions including development of fever, ongoing vomiting or any concerns for change in mental status or weakness.  Discussed how quickly urinary tract infection can impact the elderly.  Patient and son agree and verbalized understanding. I ordered medication including Zofran , Rocephin , morphine  Reevaluation of the patient after these medicines showed that the patient symptoms improved I have reviewed the patients home medicines and have made adjustments as needed   Consultations Obtained:  I requested consultation with the ER attending, Dr. Albertina,  and discussed lab and imaging findings as well as pertinent plan - they recommend: agrees with plan of care   Social Determinants of Health:  Lives at independent living facility   Test / Admission - Considered:  Offered admission, patient would like to be dc      Final diagnoses:  Lower abdominal pain  AKI (acute kidney injury) (HCC)  Urinary tract infection in female    ED Discharge Orders          Ordered    cephALEXin  (KEFLEX ) 500 MG capsule  2 times daily        09/16/23 2105    ondansetron  (ZOFRAN -ODT) 4 MG disintegrating tablet  Every 8 hours PRN        09/16/23 2306               Beverley Leita LABOR,  PA-C 09/16/23 2307    Albertina Dixon, MD 09/17/23 1726

## 2023-09-16 NOTE — ED Notes (Signed)
 Pt tolerating PO challenge appropriately at this time. Denies nausea, reports stomach is 'sore'

## 2023-09-16 NOTE — ED Triage Notes (Signed)
 Pt c/o lower abd pain, nausea, dizziness, HA onset yesterday afternoon. States she has been able to retain PO today, but nausea's no better.

## 2023-09-16 NOTE — Discharge Instructions (Signed)
 Home to rest and hydrate. Antibiotics as prescribed and complete the full course. Recheck with your primary care provider this week. Return to the ER for any worsening or concerning symptoms.

## 2023-09-16 NOTE — ED Notes (Signed)
Spoke with lab about urine.

## 2023-09-19 ENCOUNTER — Other Ambulatory Visit: Payer: Self-pay

## 2023-09-19 LAB — URINE CULTURE: Culture: 80000 — AB

## 2023-09-19 MED ORDER — FUROSEMIDE 40 MG PO TABS: 80.0000 mg | ORAL_TABLET | Freq: Every morning | ORAL | 2 refills | Status: AC

## 2023-09-20 ENCOUNTER — Telehealth (HOSPITAL_BASED_OUTPATIENT_CLINIC_OR_DEPARTMENT_OTHER): Payer: Self-pay

## 2023-09-20 NOTE — Telephone Encounter (Signed)
 Post ED Visit - Positive Culture Follow-up  Culture report reviewed by antimicrobial stewardship pharmacist: [facility] [x]  Leonor Bash, [place].D. []  Venetia Gully, Pharm.D., BCPS AQ-ID []  Garrel Crews, Pharm.D., BCPS []  Almarie Lunger, Pharm.D., BCPS []  [place], [address].D., BCPS, AAHIVP []  Rosaline Bihari, Pharm.D., BCPS, AAHIVP []  Vernell Meier, PharmD, BCPS []  Latanya Hint, PharmD, BCPS []  Donald Medley, PharmD, BCPS []  Rocky Bold, PharmD []  Dorothyann Alert, PharmD, BCPS []  Morene Babe, PharmD  [facility] []  Rosaline Edison, PharmD []  Romona Bliss, PharmD []  Dolphus Roller, PharmD []  Veva Seip, Rph []  Vernell Daunt) Leonce, PharmD []  Eva Allis, PharmD []  Rosaline Millet, PharmD []  Iantha Batch, PharmD []  Arvin Gauss, PharmD []  Wanda Hasting, PharmD []  Ronal Rav, PharmD []  Rocky Slade, PharmD []  Bard Jeans, PharmD   Positive urine culture Treated with Cephalexin , organism sensitive to the same and no further patient follow-up is required at this time.  Ruth Camelia Elbe 09/20/2023, 9:16 AM

## 2023-09-24 ENCOUNTER — Ambulatory Visit: Admitting: Internal Medicine

## 2023-09-26 ENCOUNTER — Ambulatory Visit: Admitting: Internal Medicine

## 2023-09-26 VITALS — BP 120/64 | HR 54 | Temp 97.0°F | Resp 18 | Ht 63.0 in | Wt 148.5 lb

## 2023-09-26 DIAGNOSIS — B9620 Unspecified Escherichia coli [E. coli] as the cause of diseases classified elsewhere: Secondary | ICD-10-CM | POA: Diagnosis not present

## 2023-09-26 DIAGNOSIS — N390 Urinary tract infection, site not specified: Secondary | ICD-10-CM

## 2023-09-26 LAB — POCT URINALYSIS DIPSTICK
Bilirubin, UA: NEGATIVE
Blood, UA: NEGATIVE
Glucose, UA: NEGATIVE
Ketones, UA: POSITIVE
Nitrite, UA: NEGATIVE
Protein, UA: POSITIVE — AB
Spec Grav, UA: 1.01 (ref 1.010–1.025)
Urobilinogen, UA: 0.2 U/dL
pH, UA: 5 (ref 5.0–8.0)

## 2023-09-26 MED ORDER — AMOXICILLIN 500 MG PO CAPS: 500.0000 mg | ORAL_CAPSULE | Freq: Three times a day (TID) | ORAL | 0 refills | Status: AC

## 2023-09-26 NOTE — Progress Notes (Unsigned)
   Acute Office Visit  Subjective:     Patient ID: April Hodges, female    DOB: [birth year], 89 y.o.   MRN: [id number]  Chief Complaint  Patient presents with   office visit    Patient here for possible uti       HPI Patient is in today for follow up of uti.   She was seen in the emergency room on July 7 for lower abdominal pain.  She has CT scan abdomen and pelvis that was stable.  She was started on Keflex  500 twice a day after 1 g Rocephin  was given for urinary tract infection.  She says that she tube cephalexin  500 mg twice a day she is feeling better but her symptoms have not resolved.  Her culture grew E coli.  She tells me that she does not feel back to normal.  Her urine analysis today shows large leukocyte esterase and nitrite positive.    Her legs are also drying up.  Review of Systems  Constitutional: Negative.   Gastrointestinal:  Positive for abdominal pain.  Genitourinary:  Positive for urgency.        Objective:    BP 120/64   Pulse (!) 54   Temp (!) 97 F (36.1 C)   Resp 18   Ht 5' 3 (1.6 m)   Wt 148 lb 8 oz (67.4 kg)   SpO2 96%   BMI 26.31 kg/m    Physical Exam Constitutional:      Appearance: Normal appearance.  HENT:     Head: Atraumatic.  Abdominal:     General: Bowel sounds are normal.     Palpations: Abdomen is soft.  Neurological:     General: No focal deficit present.     Mental Status: She is alert and oriented to person, place, and time.     Results for orders placed or performed in visit on 09/26/23  POCT urinalysis dipstick  Result Value Ref Range   Color, UA yellow    Clarity, UA clear    Glucose, UA Negative Negative   Bilirubin, UA neg    Ketones, UA pos    Spec Grav, UA 1.010 1.010 - 1.025   Blood, UA neg    pH, UA 5.0 5.0 - 8.0   Protein, UA Positive (A) Negative   Urobilinogen, UA 0.2 0.2 or 1.0 E.U./dL   Nitrite, UA neg    Leukocytes, UA Moderate (2+) (A) Negative   Appearance clear    Odor none          Assessment & Plan:   Problem List Items Addressed This Visit       Genitourinary   E. coli urinary tract infection - Primary     She is partially treated for urinary tract infection.  I will start her on amoxicillin  500 mg 3 times a day for 7 days.  She does not like yogurt but she will use some probiotic.       No orders of the defined types were placed in this encounter.   No follow-ups on file.  [name], MD

## 2023-09-27 ENCOUNTER — Other Ambulatory Visit: Payer: Self-pay

## 2023-09-27 MED ORDER — TRIAMCINOLONE ACETONIDE 0.1 % EX CREA: TOPICAL_CREAM | Freq: Two times a day (BID) | CUTANEOUS | 0 refills | Status: DC

## 2023-09-27 NOTE — Assessment & Plan Note (Signed)
 She is partially treated for urinary tract infection.  I will start her on amoxicillin  500 mg 3 times a day for 7 days.  She does not like yogurt but she will use some probiotic.

## 2023-09-30 ENCOUNTER — Other Ambulatory Visit: Payer: Self-pay | Admitting: Internal Medicine

## 2023-10-01 ENCOUNTER — Ambulatory Visit: Admitting: Internal Medicine

## 2023-10-08 ENCOUNTER — Ambulatory Visit: Admitting: Internal Medicine

## 2023-10-08 ENCOUNTER — Encounter: Payer: Self-pay | Admitting: Internal Medicine

## 2023-10-08 VITALS — BP 122/60 | HR 67 | Temp 98.0°F | Resp 18 | Wt 149.0 lb

## 2023-10-08 DIAGNOSIS — L97919 Non-pressure chronic ulcer of unspecified part of right lower leg with unspecified severity: Secondary | ICD-10-CM

## 2023-10-08 DIAGNOSIS — I83029 Varicose veins of left lower extremity with ulcer of unspecified site: Secondary | ICD-10-CM

## 2023-10-08 DIAGNOSIS — I83019 Varicose veins of right lower extremity with ulcer of unspecified site: Secondary | ICD-10-CM | POA: Diagnosis not present

## 2023-10-08 DIAGNOSIS — L97929 Non-pressure chronic ulcer of unspecified part of left lower leg with unspecified severity: Secondary | ICD-10-CM

## 2023-10-08 DIAGNOSIS — R600 Localized edema: Secondary | ICD-10-CM

## 2023-10-08 DIAGNOSIS — E039 Hypothyroidism, unspecified: Secondary | ICD-10-CM

## 2023-10-08 DIAGNOSIS — I10 Essential (primary) hypertension: Secondary | ICD-10-CM

## 2023-10-08 DIAGNOSIS — N184 Chronic kidney disease, stage 4 (severe): Secondary | ICD-10-CM

## 2023-10-08 NOTE — Progress Notes (Unsigned)
   Office Visit  Subjective   Patient ID: April Hodges   DOB: [birth year]   Age: 89 y.o.   MRN: [id number]   Chief Complaint Chief Complaint  Patient presents with  . Follow-up    2 month follow up     History of Present Illness HPI   Past Medical History Past Medical History:  Diagnosis Date  . Acute on chronic renal failure (HCC) 06/03/2015  . Arthritis   . Cancer (HCC)    skin cancer  . CKD (chronic kidney disease), stage III (HCC)   . Demand ischemia (HCC)    a. 05/2015 in setting of sepsis w/ troponin peak of 8.47;  b. 05/2015 Echo: EF 60-65%, no rwma, Gr1 DD-->No ischemic eval undertaken.  . Essential hypertension 06/03/2015  . GERD (gastroesophageal reflux disease)   . Headache    in younger years  . Hypothyroidism   . Pneumonia    as a child  . PONV (postoperative nausea and vomiting)   . Sinus bradycardia    a. states rates in 40's and low 50's are normal for her-->No AVN blocking agents.  . Systolic murmur    a. 05/2015 No sigificant valvular dzs on echo.  . Urosepsis 06/03/2015     Allergies Allergies  Allergen Reactions  . Bystolic  [Nebivolol  Hcl] Other (See Comments)    Bradycardia - HR in the 40s  . Amlodipine  Swelling    Coughing as well     Review of Systems ROS     Objective:    Vitals BP 122/60   Pulse 67   Temp 98 F (36.7 C)   Resp 18   Wt 149 lb (67.6 kg)   SpO2 97%   BMI 26.39 kg/m    Physical Examination Physical Exam     Assessment & Plan:   No problem-specific Assessment & Plan notes found for this encounter.    No follow-ups on file.   [name], MD

## 2023-10-09 NOTE — Assessment & Plan Note (Signed)
 She stop using Radio broadcast assistant.  She says that she can apply elastic bandage by herself.  She will clean that wound and apply elastic bandage during daytime.  If wound started getting worse then she will come back.

## 2023-10-09 NOTE — Assessment & Plan Note (Signed)
 I have discussed with her to double the dose of furosemide  for few days and then decrease it to once a day.

## 2023-10-09 NOTE — Assessment & Plan Note (Signed)
 Will continue to monitor renal function.  I will do labs on next visit.

## 2023-10-09 NOTE — Assessment & Plan Note (Signed)
 She will continue taking levothyroxine  125 mcg daily and her TSH was normal.

## 2023-10-09 NOTE — Assessment & Plan Note (Signed)
 Her blood pressure is well controlled.

## 2023-10-10 ENCOUNTER — Ambulatory Visit: Admitting: Internal Medicine

## 2023-10-10 VITALS — BP 122/64 | HR 46 | Temp 97.8°F | Resp 18 | Ht 63.0 in | Wt 148.0 lb

## 2023-10-10 DIAGNOSIS — N390 Urinary tract infection, site not specified: Secondary | ICD-10-CM | POA: Diagnosis not present

## 2023-10-10 LAB — POCT URINALYSIS DIPSTICK
Bilirubin, UA: NEGATIVE
Blood, UA: NEGATIVE
Glucose, UA: NEGATIVE
Nitrite, UA: NEGATIVE
Protein, UA: NEGATIVE
Spec Grav, UA: 1.01 (ref 1.010–1.025)
Urobilinogen, UA: 0.2 U/dL
pH, UA: 5 (ref 5.0–8.0)

## 2023-10-10 NOTE — Progress Notes (Signed)
 Office Visit  Subjective   Patient ID: April Hodges   DOB: December 11, 1925   Age: 89 y.o.   MRN: [id number]   Chief Complaint Chief Complaint  Patient presents with   office visit    Possible uti      History of Present Illness April Hodges comes in today by request of her family for verification of treatment of her UTI.  April Hodges began having symptoms with abdominal pain with nausea and vomiting where April Hodges was taken to the ER on 09/16/2023.  They did labs and a CT scan of her abd/pelvis which showed no acute inra-abdominal or pelvic pathology.  There was colonic diverticulosis.  Her WBC was normal.  They treated her with IV rocephin  x1 and send her out with a prescription with keflex  500mg  BID x 5 days.  Her urine culture grew out E. Coli that was sensitive to keflex .  April Hodges saw Dr. Caleen on 09/26/2023 where it was followup.  His notes states that April Hodges felt better but her symptoms were not resolved.  April Hodges tells me at that time though April Hodges was not having any symptoms.  Dr. Caleen did a repeat UA at that time that was positive.  He did not do a culture but he did write her for amoxicillin  500mg  TID x 7 days.  April Hodges has completed her course of amoxicillin  and today April Hodges denies any urinary symptoms or other problems and April Hodges is asymptomatic.     Past Medical History Past Medical History:  Diagnosis Date   Acute on chronic renal failure (HCC) 06/03/2015   Arthritis    Cancer (HCC)    skin cancer   CKD (chronic kidney disease), stage III (HCC)    Demand ischemia (HCC)    a. 05/2015 in setting of sepsis w/ troponin peak of 8.47;  b. 05/2015 Echo: EF 60-65%, no rwma, Gr1 DD-->No ischemic eval undertaken.   Essential hypertension 06/03/2015   GERD (gastroesophageal reflux disease)    Headache    in younger years   Hypothyroidism    Pneumonia    as a child   PONV (postoperative nausea and vomiting)    Sinus bradycardia    a. states rates in 40's and low 50's are normal for her-->No AVN blocking agents.   Systolic  murmur    a. 05/2015 No sigificant valvular dzs on echo.   Urosepsis 06/03/2015     Allergies Allergies  Allergen Reactions   Bystolic  [Nebivolol  Hcl] Other (See Comments)    Bradycardia - HR in the 40s   Amlodipine  Swelling    Coughing as well     Medications  Current Outpatient Medications:    acetaminophen  (TYLENOL ) 500 MG tablet, Take 500 mg by mouth See admin instructions. 500mg  by mouth every morning and 500 mg as needed for pain, Disp: , Rfl:    amoxicillin  (AMOXIL ) 500 MG capsule, Take 1 capsule (500 mg total) by mouth 3 (three) times daily., Disp: 21 capsule, Rfl: 0   diltiazem  (CARDIZEM  CD) 240 MG 24 hr capsule, TAKE 1 CAPSULE BY MOUTH EVERY MORNING *DO NOT CRUSH OR CHEW*, Disp: 90 capsule, Rfl: 10   FARXIGA  5 MG TABS tablet, Take 1 tablet (5 mg total) by mouth every morning., Disp: 90 tablet, Rfl: 2   furosemide  (LASIX ) 40 MG tablet, Take 2 tablets (80 mg total) by mouth every morning., Disp: 180 tablet, Rfl: 2   levothyroxine  (SYNTHROID ) 125 MCG tablet, Take 1 tablet (125 mcg total) by mouth daily., Disp: 7 tablet, Rfl:  10   losartan  (COZAAR ) 25 MG tablet, TAKE 1 TABLET BY MOUTH ONCE DAILY, Disp: 30 tablet, Rfl: 10   Multiple Vitamins-Minerals (MULTIVITAMIN PO), Take 1 tablet by mouth daily., Disp: , Rfl:    omeprazole  (PRILOSEC) 10 MG capsule, TAKE 1 CAPSULE BY MOUTH ONCE DAILY *TAKE ON AN EMPTY STOMACH* *DO NOT CRUSH OR CHEW*, Disp: 30 capsule, Rfl: 10   ondansetron  (ZOFRAN -ODT) 4 MG disintegrating tablet, Take 1 tablet (4 mg total) by mouth every 8 (eight) hours as needed for nausea or vomiting., Disp: 12 tablet, Rfl: 0   triamcinolone  0.1%-Eucerin equivalent 1:1 cream mixture, APPLY TOPICALLY AS DIRECTED TWICE DAILY, Disp: 480 g, Rfl: 0   Wound Dressings (UNNA-FLEX ELASTIC UNNA BOOT) MISC, Apply to both legs three time a week to both, Disp: 4 each, Rfl: 0   Review of Systems Review of Systems  Constitutional:  Negative for chills and fever.  Respiratory:  Negative for  shortness of breath.   Cardiovascular:  Negative for chest pain.  Gastrointestinal:  Negative for abdominal pain, diarrhea, nausea and vomiting.  Musculoskeletal:  Negative for myalgias.  Skin:  Negative for itching and rash.  Neurological:  Negative for dizziness, weakness and headaches.       Objective:    Vitals BP 122/64   Pulse (!) 46   Temp 97.8 F (36.6 C)   Resp 18   Ht 5' 3 (1.6 m)   Wt 148 lb (67.1 kg)   SpO2 98%   BMI 26.22 kg/m    Physical Examination Physical Exam Constitutional:      Appearance: Normal appearance. April Hodges is not ill-appearing.  Cardiovascular:     Rate and Rhythm: Normal rate and regular rhythm.     Pulses: Normal pulses.     Heart sounds: No murmur heard.    No friction rub. No gallop.  Pulmonary:     Effort: Pulmonary effort is normal. No respiratory distress.     Breath sounds: No wheezing, rhonchi or rales.  Abdominal:     General: Bowel sounds are normal. There is no distension.     Palpations: Abdomen is soft.     Tenderness: There is no abdominal tenderness.  Musculoskeletal:     Right lower leg: No edema.     Left lower leg: No edema.  Skin:    General: Skin is warm and dry.     Findings: No rash.  Neurological:     Mental Status: April Hodges is alert.        Assessment & Plan:   No problem-specific Assessment & Plan notes found for this encounter.    No follow-ups on file.   Selinda Fleeta Finger, MD

## 2023-10-10 NOTE — Assessment & Plan Note (Signed)
 She does not need a test of cure as she is not havng symptoms.  We did do a UA though because her son asked and this was near normal.

## 2023-10-18 ENCOUNTER — Telehealth: Payer: Self-pay | Admitting: Radiation Oncology

## 2023-10-18 NOTE — Telephone Encounter (Signed)
 8/8 @ 2:15 pm Left voicemail for patient to call our office to be schedule for consult.

## 2023-10-21 ENCOUNTER — Telehealth: Payer: Self-pay | Admitting: Radiation Oncology

## 2023-10-21 NOTE — Telephone Encounter (Signed)
 8/11 @ 10:30 am Left voicemail with pt's son, also spoke to patient's daughter.  Patient's son sch pt's appts/transportation.  Waiting on call to sch consult.

## 2023-10-22 NOTE — Progress Notes (Signed)
 Radiation Oncology         [phone] ________________________________  Initial outpatient Consultation  Name: April Hodges MRN: [id number]  Date: 10/23/2023  DOB: 06/15/1925  RR:Jfpw, Roetta, MD  Lynnell Nottingham, MD   REFERRING PHYSICIAN: Lynnell Nottingham, MD  DIAGNOSIS:    ICD-10-CM   1. Malignant neoplasm of skin of forehead  C44.309       Basel cell carcinoma of forehead  [id number]    CHIEF COMPLAINT: Here to discuss management of skin cancer  HISTORY OF PRESENT ILLNESS::April Hodges is a 89 y.o. 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 She was first diagnosed in 2012 and had numerous recurrences since then.   Patient presented for a follow up with Dr. Lynnell on 10/10/23 with continued complains of lesion located on her forehead. Lesion was previously excised by Dr. Lynnell the previous month but continues to regrow. During her visit, a shave biopsy was performed confirming ulcerated basal cell carcinoma with nodular patterns.    She was advised by Dr Lynnell  to be seen for consideration of radiation therapy to this lesion.  She presents today with her son.  She is an independent living in a senior community and her son lives nearby and [place].  He is very supportive and active in her life.  The patient is a retired Engineer, civil (consulting).  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  She walks with a cane and is quite functional for her age.  PHOTO FROM DERMATOLOGY:   PREVIOUS RADIATION THERAPY: No  PAST MEDICAL HISTORY:  has a past medical history of Acute on chronic renal failure (HCC) (06/03/2015), Arthritis, Cancer (HCC), CKD (chronic kidney disease), stage III (HCC), Demand ischemia (HCC), Essential hypertension (06/03/2015), GERD (gastroesophageal reflux disease), Headache, Hypothyroidism, Pneumonia, PONV  (postoperative nausea and vomiting), Sinus bradycardia, Skin cancer, Systolic murmur, and Urosepsis (06/03/2015).    PAST SURGICAL HISTORY: Past Surgical History:  Procedure Laterality Date   BILATERAL CARPAL TUNNEL RELEASE     BILIARY STENT PLACEMENT  07/14/2018   Procedure: BILIARY STENT PLACEMENT;  Surgeon: Saintclair Jasper, MD;  Location: [facility] ENDOSCOPY;  Service: Gastroenterology;;   CHOLECYSTECTOMY N/A 07/16/2018   Procedure: LAPAROSCOPIC CHOLECYSTECTOMY;  Surgeon: Vanderbilt Ned, MD;  Location: [facility] OR;  Service: General;  Laterality: N/A;   COLONOSCOPY     ERCP N/A 07/14/2018   Procedure: ENDOSCOPIC RETROGRADE CHOLANGIOPANCREATOGRAPHY (ERCP);  Surgeon: Saintclair Jasper, MD;  Location: [facility] ENDOSCOPY;  Service: Gastroenterology;  Laterality: N/A;   ESOPHAGOGASTRODUODENOSCOPY (EGD) WITH PROPOFOL  N/A 08/18/2018   Procedure: ESOPHAGOGASTRODUODENOSCOPY (EGD) WITH PROPOFOL ;  Surgeon: Saintclair Jasper, MD;  Location: [facility] ENDOSCOPY;  Service: Gastroenterology;  Laterality: N/A;  stent removal   EYE SURGERY Bilateral    cataracts removed and lens implant   JOINT REPLACEMENT Bilateral    knees   LUMBAR LAMINECTOMY/DECOMPRESSION MICRODISCECTOMY N/A 06/10/2014   Procedure: LUMBAR DECOMPRESSION L3 - L5 2 LEVELS;  Surgeon: Donaciano Sprang, MD;  Location: [facility] OR;  Service: Orthopedics;  Laterality: N/A;   REMOVAL OF STONES  07/14/2018   Procedure: REMOVAL OF STONES;  Surgeon: Saintclair Jasper, MD;  Location: [facility] ENDOSCOPY;  Service: Gastroenterology;;   MATIAS  07/14/2018   Procedure: MATIAS;  Surgeon: Saintclair Jasper, MD;  Location: [facility] ENDOSCOPY;  Service: Gastroenterology;;   ANNETT  07/14/2018   Procedure: ANNETT;  Surgeon: Saintclair Jasper, MD;  Location: [facility] ENDOSCOPY;  Service: Gastroenterology;;  STENT REMOVAL  08/18/2018   Procedure: STENT REMOVAL;  Surgeon: Saintclair Jasper, MD;  Location: [facility] ENDOSCOPY;  Service: Gastroenterology;;   TONSILLECTOMY     VAGINAL HYSTERECTOMY      FAMILY HISTORY: family history  includes Congestive Heart Failure in her mother; Heart attack in her father.  SOCIAL HISTORY:  reports that she has never smoked. She has never used smokeless tobacco. She reports that she does not currently use alcohol. She reports that she does not use drugs.  ALLERGIES: Bystolic  [nebivolol  hcl] and Amlodipine   MEDICATIONS:  Current Outpatient Medications  Medication Sig Dispense Refill   acetaminophen  (TYLENOL ) 500 MG tablet Take 500 mg by mouth See admin instructions. 500mg  by mouth every morning and 500 mg as needed for pain     diltiazem  (CARDIZEM  CD) 240 MG 24 hr capsule TAKE 1 CAPSULE BY MOUTH EVERY MORNING *DO NOT CRUSH OR CHEW* 90 capsule 10   FARXIGA  5 MG TABS tablet Take 1 tablet (5 mg total) by mouth every morning. 90 tablet 2   furosemide  (LASIX ) 40 MG tablet Take 2 tablets (80 mg total) by mouth every morning. 180 tablet 2   levothyroxine  (SYNTHROID ) 125 MCG tablet Take 1 tablet (125 mcg total) by mouth daily. 7 tablet 10   losartan  (COZAAR ) 25 MG tablet TAKE 1 TABLET BY MOUTH ONCE DAILY 30 tablet 10   Multiple Vitamins-Minerals (MULTIVITAMIN PO) Take 1 tablet by mouth daily.     omeprazole  (PRILOSEC) 10 MG capsule TAKE 1 CAPSULE BY MOUTH ONCE DAILY *TAKE ON AN EMPTY STOMACH* *DO NOT CRUSH OR CHEW* 30 capsule 10   ondansetron  (ZOFRAN -ODT) 4 MG disintegrating tablet Take 1 tablet (4 mg total) by mouth every 8 (eight) hours as needed for nausea or vomiting. 12 tablet 0   triamcinolone  0.1%-Eucerin equivalent 1:1 cream mixture APPLY TOPICALLY AS DIRECTED TWICE DAILY 480 g 0   amoxicillin  (AMOXIL ) 500 MG capsule Take 1 capsule (500 mg total) by mouth 3 (three) times daily. (Patient not taking: Reported on 10/23/2023) 21 capsule 0   Wound Dressings (UNNA-FLEX ELASTIC UNNA BOOT) MISC Apply to both legs three time a week to both (Patient not taking: Reported on 10/23/2023) 4 each 0   No current facility-administered medications for this encounter.    REVIEW OF SYSTEMS:  Notable for  that above.   PHYSICAL EXAM:  height is 5' 1 (1.549 m) and weight is 146 lb 3.2 oz (66.3 kg). Her oral temperature is 97.6 F (36.4 C). Her blood pressure is 151/52 (abnormal) and her pulse is 54 (abnormal). Her respiration is 20 and oxygen saturation is 97%.   General: Alert and oriented, in no acute distress   HEENT: Head is normocephalic. Extraocular movements are intact.  There is a >2cm lesion on her left forehead consistent with the photograph above -it is flat Neck: Neck is supple, no palpable cervical or supraclavicular lymphadenopathy -no masses within the periauricular regions either. Lymphatics: see Neck Exam Skin: See HEENT regarding left forehead lesion Musculoskeletal: Well-nourished.  She ambulates with a cane independently. Neurologic: Cranial nerves II through XII are grossly intact.   Psychiatric: Judgment and insight are intact. Affect is appropriate.   ECOG = 1  0 - Asymptomatic (Fully active, able to carry on all predisease activities without restriction)  1 - Symptomatic but completely ambulatory (Restricted in physically strenuous activity but ambulatory and able to carry out work of a light or sedentary nature. For example, light housework, office work)  2 - Symptomatic, <50% in bed  during the day (Ambulatory and capable of all self care but unable to carry out any work activities. Up and about more than 50% of waking hours)  3 - Symptomatic, >50% in bed, but not bedbound (Capable of only limited self-care, confined to bed or chair 50% or more of waking hours)  4 - Bedbound (Completely disabled. Cannot carry on any self-care. Totally confined to bed or chair)  5 - Death   Raylene MM, Creech RH, Tormey DC, et al. [phone]). Toxicity and response criteria of the [facility]. Am. DOROTHA Bridges. Oncol. 5 (6): 649-55   LABORATORY DATA:  Lab Results  Component Value Date   WBC 9.1 09/16/2023   HGB 13.5 09/16/2023   HCT 40.8 09/16/2023   MCV 94.9  09/16/2023   PLT 270 09/16/2023   CMP     Component Value Date/Time   NA 135 09/16/2023 1644   NA 138 08/06/2023 1445   K 4.5 09/16/2023 1644   CL 98 09/16/2023 1644   CO2 19 (L) 09/16/2023 1644   GLUCOSE 141 (H) 09/16/2023 1644   BUN 44 (H) 09/16/2023 1644   BUN 55 (H) 08/06/2023 1445   CREATININE 2.29 (H) 09/16/2023 1644   CREATININE 1.39 (H) 10/17/2015 0953   CALCIUM  9.7 09/16/2023 1644   PROT 8.6 (H) 09/16/2023 1644   PROT 7.9 08/06/2023 1445   ALBUMIN 4.1 09/16/2023 1644   ALBUMIN 4.1 08/06/2023 1445   AST 51 (H) 09/16/2023 1644   ALT 42 09/16/2023 1644   ALKPHOS 95 09/16/2023 1644   BILITOT 0.4 09/16/2023 1644   BILITOT 0.4 08/06/2023 1445   EGFR 26 (L) 08/06/2023 1445   GFRNONAA 19 (L) 09/16/2023 1644         RADIOGRAPHY: No results found.    IMPRESSION/PLAN:    ICD-10-CM   1. Malignant neoplasm of skin of forehead  C44.309      This is a delightful patient with skin cancer of the left forehead.   It was a pleasure meeting the patient today. I recommend electron radiotherapy.   We discussed the nature of basal cell carcinoma and the role radiotherapy plays in treatment today. We reviewed the benefits, risks, and possible side effects from the treatment. Possible side effects include fatigue and skin irritation with scabbing/peeling.  Some permanent skin changes may occur, but cosmesis is generally favorable after radiation therapy.  Nonhealing wound to tissue in the irradiated field is rare.   The dose will be delivered superficially and therefore the risk to her brain is minimal. The patient and her son expressed understanding of the treatment which is of curative intent. All questions were answered to their stated satisfaction. No guarantees of treatment given. A consent form was signed and placed in their chart today.  I will ask my staff to contact her son soon so that her treatment planning can take place in the near future.  We discussed different  fractionation scheme's.  Given her age I would not recommend standard hypofractionation as it could be quite difficult for her to come in for the standard 20 treatments.  We talked about the option of 10 versus 5 treatments.  The 10 treatment option, which is given twice a week for 5 weeks, is a bit more commonly used than the 5 treatment regimen and may be slightly more effective.  However, I think both options are excellent for her circumstances.  The patient and her son would like to pursue 10 treatments given twice a week.  We will arrange accordingly.     On date of service, in total, I spent 45 minutes on this encounter. Patient was seen in person.   __________________________________________   Lauraine Golden, MD  This document serves as a record of services personally performed by Lauraine Golden, MD. It was created on her behalf by Reymundo Cartwright, a trained medical scribe. The creation of this record is based on the scribe's personal observations and the provider's statements to them. This document has been checked and approved by the attending provider.

## 2023-10-23 ENCOUNTER — Encounter: Payer: Self-pay | Admitting: Radiation Oncology

## 2023-10-23 ENCOUNTER — Ambulatory Visit
Admission: RE | Admit: 2023-10-23 | Discharge: 2023-10-23 | Disposition: A | Discharge status: Home / Self Care | Source: Ambulatory Visit | Attending: Radiation Oncology | Admitting: Radiation Oncology

## 2023-10-23 VITALS — BP 151/52 | HR 54 | Temp 97.6°F | Resp 20 | Ht 61.0 in | Wt 146.2 lb

## 2023-10-23 DIAGNOSIS — C44309 Unspecified malignant neoplasm of skin of other parts of face: Secondary | ICD-10-CM | POA: Insufficient documentation

## 2023-10-23 HISTORY — DX: Unspecified malignant neoplasm of skin, unspecified: C44.90

## 2023-10-23 NOTE — Progress Notes (Signed)
 Oncology Nurse Navigator Documentation   Met with patient during initial consult with Dr. Izell. She was accompanied by her son.  Further introduced myself as her/their Navigator, explained my role as a member of the Care Team. I provided them with my direct contact information.  They verbalized understanding of information provided. I encouraged them to call with questions/concerns moving forward.  Delon Jefferson, RN, BSN, OCN Head & Neck Oncology Nurse Navigator [facility] [phone]

## 2023-10-23 NOTE — Progress Notes (Signed)
 Histology and Location of Primary Skin Cancer:  Basal Cell Carcinoma on Left Superior Medial Forehead Shave  April Hodges presented with the following signs/symptoms, one month ago:  Lesion located on her forehead. The lesion was previously excised by Dr. Lynnell the previous month but continues to regrow. During her visit a shave biopsy was performed confirming ulcerated basel cell carcinoma with nodular patterns.  Past/Anticipated interventions by patient's surgeon/dermatologist for current problematic lesion, if any:  Radiation  Past skin cancers, if any:    History of Blistering sunburns, if any: Yes, patient has a lot of sun exposure worked in the garden a lot as an   SAFETY ISSUES: Prior radiation? None Pacemaker/ICD? None Possible current pregnancy? None Is the patient on methotrexate? None  Current Complaints / other details:  None  BP (!) 151/52 (BP Location: Left Arm, Patient Position: Sitting)   Pulse (!) 54   Temp 97.6 F (36.4 C) (Oral)   Resp 20   Ht 5' 1 (1.549 m)   Wt 146 lb 3.2 oz (66.3 kg)   SpO2 97%   BMI 27.62 kg/m   Wt Readings from Last 3 Encounters:  10/23/23 146 lb 3.2 oz (66.3 kg)  10/10/23 148 lb (67.1 kg)  10/08/23 149 lb (67.6 kg)

## 2023-10-25 ENCOUNTER — Ambulatory Visit
Admission: RE | Admit: 2023-10-25 | Discharge: 2023-10-25 | Disposition: A | Discharge status: Home / Self Care | Source: Ambulatory Visit | Attending: Radiation Oncology | Admitting: Radiation Oncology

## 2023-10-25 DIAGNOSIS — C44309 Unspecified malignant neoplasm of skin of other parts of face: Secondary | ICD-10-CM | POA: Diagnosis not present

## 2023-10-25 DIAGNOSIS — Z510 Encounter for antineoplastic radiation therapy: Secondary | ICD-10-CM | POA: Insufficient documentation

## 2023-11-01 DIAGNOSIS — Z510 Encounter for antineoplastic radiation therapy: Secondary | ICD-10-CM | POA: Diagnosis not present

## 2023-11-04 ENCOUNTER — Ambulatory Visit
Admission: RE | Admit: 2023-11-04 | Discharge: 2023-11-04 | Disposition: A | Discharge status: Home / Self Care | Source: Ambulatory Visit | Attending: Radiation Oncology | Admitting: Radiation Oncology

## 2023-11-04 ENCOUNTER — Other Ambulatory Visit: Payer: Self-pay

## 2023-11-04 ENCOUNTER — Ambulatory Visit
Admission: RE | Admit: 2023-11-04 | Discharge: 2023-11-04 | Disposition: A | Discharge status: Home / Self Care | Source: Ambulatory Visit | Attending: Radiation Oncology

## 2023-11-04 DIAGNOSIS — Z510 Encounter for antineoplastic radiation therapy: Secondary | ICD-10-CM | POA: Diagnosis not present

## 2023-11-04 LAB — RAD ONC ARIA SESSION SUMMARY
Course Elapsed Days: 0
Plan Fractions Treated to Date: 1
Plan Prescribed Dose Per Fraction: 4.4 Gy
Plan Total Fractions Prescribed: 10
Plan Total Prescribed Dose: 44 Gy
Reference Point Dosage Given to Date: 4.4 Gy
Reference Point Session Dosage Given: 4.4 Gy
Session Number: 1

## 2023-11-05 ENCOUNTER — Ambulatory Visit

## 2023-11-06 ENCOUNTER — Ambulatory Visit

## 2023-11-07 ENCOUNTER — Ambulatory Visit

## 2023-11-08 ENCOUNTER — Ambulatory Visit
Admission: RE | Admit: 2023-11-08 | Discharge: 2023-11-08 | Disposition: A | Discharge status: Home / Self Care | Source: Ambulatory Visit | Attending: Radiation Oncology | Admitting: Radiation Oncology

## 2023-11-08 ENCOUNTER — Other Ambulatory Visit: Payer: Self-pay

## 2023-11-08 DIAGNOSIS — Z510 Encounter for antineoplastic radiation therapy: Secondary | ICD-10-CM | POA: Diagnosis not present

## 2023-11-08 LAB — RAD ONC ARIA SESSION SUMMARY
Course Elapsed Days: 4
Plan Fractions Treated to Date: 2
Plan Prescribed Dose Per Fraction: 4.4 Gy
Plan Total Fractions Prescribed: 10
Plan Total Prescribed Dose: 44 Gy
Reference Point Dosage Given to Date: 8.8 Gy
Reference Point Session Dosage Given: 4.4 Gy
Session Number: 2

## 2023-11-12 ENCOUNTER — Ambulatory Visit
Admission: RE | Admit: 2023-11-12 | Discharge: 2023-11-12 | Disposition: A | Discharge status: Home / Self Care | Source: Ambulatory Visit | Attending: Radiation Oncology | Admitting: Radiation Oncology

## 2023-11-12 ENCOUNTER — Other Ambulatory Visit: Payer: Self-pay

## 2023-11-12 DIAGNOSIS — C44309 Unspecified malignant neoplasm of skin of other parts of face: Secondary | ICD-10-CM | POA: Insufficient documentation

## 2023-11-12 DIAGNOSIS — Z510 Encounter for antineoplastic radiation therapy: Secondary | ICD-10-CM | POA: Diagnosis present

## 2023-11-12 LAB — RAD ONC ARIA SESSION SUMMARY
Course Elapsed Days: 8
Plan Fractions Treated to Date: 3
Plan Prescribed Dose Per Fraction: 4.4 Gy
Plan Total Fractions Prescribed: 10
Plan Total Prescribed Dose: 44 Gy
Reference Point Dosage Given to Date: 13.2 Gy
Reference Point Session Dosage Given: 4.4 Gy
Session Number: 3

## 2023-11-13 ENCOUNTER — Ambulatory Visit

## 2023-11-14 ENCOUNTER — Ambulatory Visit

## 2023-11-15 ENCOUNTER — Ambulatory Visit
Admission: RE | Admit: 2023-11-15 | Discharge: 2023-11-15 | Disposition: A | Discharge status: Home / Self Care | Source: Ambulatory Visit | Attending: Radiation Oncology | Admitting: Radiation Oncology

## 2023-11-15 ENCOUNTER — Other Ambulatory Visit: Payer: Self-pay

## 2023-11-15 DIAGNOSIS — Z510 Encounter for antineoplastic radiation therapy: Secondary | ICD-10-CM | POA: Diagnosis not present

## 2023-11-15 LAB — RAD ONC ARIA SESSION SUMMARY
Course Elapsed Days: 11
Plan Fractions Treated to Date: 4
Plan Prescribed Dose Per Fraction: 4.4 Gy
Plan Total Fractions Prescribed: 10
Plan Total Prescribed Dose: 44 Gy
Reference Point Dosage Given to Date: 17.6 Gy
Reference Point Session Dosage Given: 4.4 Gy
Session Number: 4

## 2023-11-18 ENCOUNTER — Ambulatory Visit
Admission: RE | Admit: 2023-11-18 | Discharge: 2023-11-18 | Disposition: A | Discharge status: Home / Self Care | Source: Ambulatory Visit | Attending: Radiation Oncology

## 2023-11-18 ENCOUNTER — Other Ambulatory Visit: Payer: Self-pay

## 2023-11-18 ENCOUNTER — Ambulatory Visit
Admission: RE | Admit: 2023-11-18 | Discharge: 2023-11-18 | Disposition: A | Discharge status: Home / Self Care | Source: Ambulatory Visit | Attending: Radiation Oncology | Admitting: Radiation Oncology

## 2023-11-18 DIAGNOSIS — Z510 Encounter for antineoplastic radiation therapy: Secondary | ICD-10-CM | POA: Diagnosis not present

## 2023-11-18 LAB — RAD ONC ARIA SESSION SUMMARY
Course Elapsed Days: 14
Plan Fractions Treated to Date: 5
Plan Prescribed Dose Per Fraction: 4.4 Gy
Plan Total Fractions Prescribed: 10
Plan Total Prescribed Dose: 44 Gy
Reference Point Dosage Given to Date: 22 Gy
Reference Point Session Dosage Given: 4.4 Gy
Session Number: 5

## 2023-11-19 ENCOUNTER — Other Ambulatory Visit: Payer: Self-pay

## 2023-11-19 MED ORDER — LOSARTAN POTASSIUM 25 MG PO TABS: 25.0000 mg | ORAL_TABLET | Freq: Every day | ORAL | 10 refills | Status: AC

## 2023-11-19 MED ORDER — DILTIAZEM HCL ER COATED BEADS 240 MG PO CP24: 240.0000 mg | ORAL_CAPSULE | Freq: Every day | ORAL | 10 refills | Status: DC

## 2023-11-19 MED ORDER — OMEPRAZOLE 10 MG PO CPDR: 10.0000 mg | DELAYED_RELEASE_CAPSULE | Freq: Every day | ORAL | 10 refills | Status: AC

## 2023-11-22 ENCOUNTER — Other Ambulatory Visit: Payer: Self-pay

## 2023-11-22 ENCOUNTER — Ambulatory Visit
Admission: RE | Admit: 2023-11-22 | Discharge: 2023-11-22 | Disposition: A | Discharge status: Home / Self Care | Source: Ambulatory Visit | Attending: Radiation Oncology | Admitting: Radiation Oncology

## 2023-11-22 DIAGNOSIS — Z510 Encounter for antineoplastic radiation therapy: Secondary | ICD-10-CM | POA: Diagnosis not present

## 2023-11-22 LAB — RAD ONC ARIA SESSION SUMMARY
Course Elapsed Days: 18
Plan Fractions Treated to Date: 6
Plan Prescribed Dose Per Fraction: 4.4 Gy
Plan Total Fractions Prescribed: 10
Plan Total Prescribed Dose: 44 Gy
Reference Point Dosage Given to Date: 26.4 Gy
Reference Point Session Dosage Given: 4.4 Gy
Session Number: 6

## 2023-11-25 ENCOUNTER — Other Ambulatory Visit: Payer: Self-pay

## 2023-11-25 ENCOUNTER — Ambulatory Visit
Admission: RE | Admit: 2023-11-25 | Discharge: 2023-11-25 | Disposition: A | Discharge status: Home / Self Care | Source: Ambulatory Visit | Attending: Radiation Oncology

## 2023-11-25 DIAGNOSIS — Z510 Encounter for antineoplastic radiation therapy: Secondary | ICD-10-CM | POA: Diagnosis not present

## 2023-11-25 LAB — RAD ONC ARIA SESSION SUMMARY
Course Elapsed Days: 21
Plan Fractions Treated to Date: 7
Plan Prescribed Dose Per Fraction: 4.4 Gy
Plan Total Fractions Prescribed: 10
Plan Total Prescribed Dose: 44 Gy
Reference Point Dosage Given to Date: 30.8 Gy
Reference Point Session Dosage Given: 4.4 Gy
Session Number: 7

## 2023-11-29 ENCOUNTER — Other Ambulatory Visit: Payer: Self-pay

## 2023-11-29 ENCOUNTER — Ambulatory Visit
Admission: RE | Admit: 2023-11-29 | Discharge: 2023-11-29 | Disposition: A | Discharge status: Home / Self Care | Source: Ambulatory Visit | Attending: Radiation Oncology | Admitting: Radiation Oncology

## 2023-11-29 DIAGNOSIS — Z510 Encounter for antineoplastic radiation therapy: Secondary | ICD-10-CM | POA: Diagnosis not present

## 2023-11-29 LAB — RAD ONC ARIA SESSION SUMMARY
Course Elapsed Days: 25
Plan Fractions Treated to Date: 8
Plan Prescribed Dose Per Fraction: 4.4 Gy
Plan Total Fractions Prescribed: 10
Plan Total Prescribed Dose: 44 Gy
Reference Point Dosage Given to Date: 35.2 Gy
Reference Point Session Dosage Given: 4.4 Gy
Session Number: 8

## 2023-12-02 ENCOUNTER — Ambulatory Visit
Admission: RE | Admit: 2023-12-02 | Discharge: 2023-12-02 | Disposition: A | Discharge status: Home / Self Care | Source: Ambulatory Visit | Attending: Radiation Oncology

## 2023-12-02 ENCOUNTER — Other Ambulatory Visit: Payer: Self-pay

## 2023-12-02 DIAGNOSIS — Z510 Encounter for antineoplastic radiation therapy: Secondary | ICD-10-CM | POA: Diagnosis not present

## 2023-12-02 LAB — RAD ONC ARIA SESSION SUMMARY
Course Elapsed Days: 28
Plan Fractions Treated to Date: 9
Plan Prescribed Dose Per Fraction: 4.4 Gy
Plan Total Fractions Prescribed: 10
Plan Total Prescribed Dose: 44 Gy
Reference Point Dosage Given to Date: 39.6 Gy
Reference Point Session Dosage Given: 4.4 Gy
Session Number: 9

## 2023-12-06 ENCOUNTER — Other Ambulatory Visit: Payer: Self-pay

## 2023-12-06 ENCOUNTER — Ambulatory Visit
Admission: RE | Admit: 2023-12-06 | Discharge: 2023-12-06 | Disposition: A | Discharge status: Home / Self Care | Source: Ambulatory Visit | Attending: Radiation Oncology | Admitting: Radiation Oncology

## 2023-12-06 DIAGNOSIS — Z510 Encounter for antineoplastic radiation therapy: Secondary | ICD-10-CM | POA: Diagnosis not present

## 2023-12-06 LAB — RAD ONC ARIA SESSION SUMMARY
Course Elapsed Days: 32
Plan Fractions Treated to Date: 10
Plan Prescribed Dose Per Fraction: 4.4 Gy
Plan Total Fractions Prescribed: 10
Plan Total Prescribed Dose: 44 Gy
Reference Point Dosage Given to Date: 44 Gy
Reference Point Session Dosage Given: 4.4 Gy
Session Number: 10

## 2023-12-09 NOTE — Radiation Completion Notes (Signed)
 Patient Name: April Hodges, April Hodges MRN: [id number] Date of Birth: 04-Feb-1926 Referring Physician: RYAN GAMMON, M.D. Date of Service: 2023-12-09 Radiation Oncologist: Lauraine Golden, M.D. [facility]                             RADIATION ONCOLOGY END OF TREATMENT NOTE     Diagnosis: C44.309 Unspecified malignant neoplasm of skin of other parts of face Intent: Curative     ==========DELIVERED PLANS==========  First Treatment Date: 2023-11-04 Last Treatment Date: 2023-12-06   Plan Name: HN_L_foreh Site: Face Technique: Electron Mode: Electron Dose Per Fraction: 4.4 Gy Prescribed Dose (Delivered / Prescribed): 44 Gy / 44 Gy Prescribed Fxs (Delivered / Prescribed): 10 / 10     ==========ON TREATMENT VISIT DATES========== 2023-11-04, 2023-11-18, 2023-12-02     ==========UPCOMING VISITS========== 01/07/2024 [facility]-RADIATION ONC FOLLOW UP 20 Wyatt Czar M, PA-C        ==========APPENDIX - ON TREATMENT VISIT NOTES==========   See weekly On Treatment Notes in Epic for details in the Media tab (listed as Progress notes on the On Treatment Visit Dates listed above).

## 2024-01-07 ENCOUNTER — Encounter: Payer: Self-pay | Admitting: Radiology

## 2024-01-07 ENCOUNTER — Ambulatory Visit
Admission: RE | Admit: 2024-01-07 | Discharge: 2024-01-07 | Disposition: A | Discharge status: Home / Self Care | Source: Ambulatory Visit | Attending: Radiology | Admitting: Radiology

## 2024-01-07 DIAGNOSIS — C44309 Unspecified malignant neoplasm of skin of other parts of face: Secondary | ICD-10-CM | POA: Insufficient documentation

## 2024-01-07 DIAGNOSIS — Z923 Personal history of irradiation: Secondary | ICD-10-CM | POA: Insufficient documentation

## 2024-01-07 DIAGNOSIS — Z79899 Other long term (current) drug therapy: Secondary | ICD-10-CM | POA: Insufficient documentation

## 2024-01-07 DIAGNOSIS — Z7984 Long term (current) use of oral hypoglycemic drugs: Secondary | ICD-10-CM | POA: Insufficient documentation

## 2024-01-07 DIAGNOSIS — Z79890 Hormone replacement therapy (postmenopausal): Secondary | ICD-10-CM | POA: Diagnosis not present

## 2024-01-07 NOTE — Progress Notes (Signed)
 Radiation Oncology         [phone] ________________________________  Name: April Hodges MRN: [id number]  Date: 01/07/2024  DOB: Dec 10, 1925  Follow-Up Visit Note  Outpatient  CC: Amin, Saad, MD  Amin, Saad, MD  Diagnosis:  Malignant neoplasm of skin of forehead  ==========DELIVERED PLANS==========  First Treatment Date: 2023-11-04 Last Treatment Date: 2023-12-06   Plan Name: HN_L_foreh Site: Face Technique: Electron Mode: Electron Dose Per Fraction: 4.4 Gy Prescribed Dose (Delivered / Prescribed): 44 Gy / 44 Gy Prescribed Fxs (Delivered / Prescribed): 10 / 10  Basal cell carcinoma of the right forehead; s/p definitive radiation completed on 12/06/2023   CHIEF COMPLAINT: Here for follow-up and surveillance of basal cell carcinoma  Interval Since Last Radiation:  1 month  Narrative:  The patient returns today for routine follow-up.    Patient reports to be doing well overall today.  She states that she tolerated the treatment well, denying any fatigue.  She is pleased with how her skin has healed since completing the treatment.  ALLERGIES:  is allergic to bystolic  [nebivolol  hcl] and amlodipine .  Meds: Current Outpatient Medications  Medication Sig Dispense Refill   acetaminophen  (TYLENOL ) 500 MG tablet Take 500 mg by mouth See admin instructions. 500mg  by mouth every morning and 500 mg as needed for pain     amoxicillin  (AMOXIL ) 500 MG capsule Take 1 capsule (500 mg total) by mouth 3 (three) times daily. (Patient not taking: Reported on 10/23/2023) 21 capsule 0   diltiazem  (CARDIZEM  CD) 240 MG 24 hr capsule Take 1 capsule (240 mg total) by mouth daily. 90 capsule 10   FARXIGA  5 MG TABS tablet Take 1 tablet (5 mg total) by mouth every morning. 90 tablet 2   furosemide  (LASIX ) 40 MG tablet Take 2 tablets (80 mg total) by mouth every morning. 180 tablet 2   levothyroxine  (SYNTHROID ) 125 MCG tablet Take 1 tablet (125 mcg total) by mouth daily. 7 tablet 10   losartan   (COZAAR ) 25 MG tablet Take 1 tablet (25 mg total) by mouth daily. 30 tablet 10   Multiple Vitamins-Minerals (MULTIVITAMIN PO) Take 1 tablet by mouth daily.     omeprazole  (PRILOSEC) 10 MG capsule Take 1 capsule (10 mg total) by mouth daily. 30 capsule 10   ondansetron  (ZOFRAN -ODT) 4 MG disintegrating tablet Take 1 tablet (4 mg total) by mouth every 8 (eight) hours as needed for nausea or vomiting. 12 tablet 0   triamcinolone  0.1%-Eucerin equivalent 1:1 cream mixture APPLY TOPICALLY AS DIRECTED TWICE DAILY 480 g 0   Wound Dressings (UNNA-FLEX ELASTIC UNNA BOOT) MISC Apply to both legs three time a week to both (Patient not taking: Reported on 10/23/2023) 4 each 0   No current facility-administered medications for this encounter.    Physical Findings: The patient is in no acute distress. Patient is alert and oriented.  height is 5' 1 (1.549 m) (pended). Her temporal temperature is 97.7 F (36.5 C) (pended). Her blood pressure is 159/54 (abnormal, pended) and her pulse is 52 (abnormal, pended). Her respiration is 18 (pended). .    Skin in the radiation field shows mild erythema, consistent with satisfactory healing.    Lab Findings: Lab Results  Component Value Date   WBC 9.1 09/16/2023   HGB 13.5 09/16/2023   HCT 40.8 09/16/2023   MCV 94.9 09/16/2023   PLT 270 09/16/2023    @LASTCHEMISTRY @  Radiographic Findings: No results found.  Impression/Plan:  Basal cell carcinoma of the right forehead; s/p definitive radiation  completed on 12/06/2023  Patient has healed very well from the effects of her radiation treatment.  She is fortunately not experiencing any residual side effects.  She will continue to see her dermatologist every 6 months.  Radiation follow-up as needed.  She was encouraged to call back with any questions or concerns.  We appreciate the opportunity to take part in this patient's care.  On date of service, in total, I spent 20 minutes on this encounter. Patient was  seen in person.  _____________________________________    Leeroy Due, PA-C

## 2024-01-07 NOTE — Progress Notes (Signed)
 Patient identity verified x2 Diagnosis: C44.309 Unspecified malignant neoplasm of skin    Treatment Completion Date: 12/06/23 Pain issues, if any: Denies Skin irritation/ Hair loss: Denies Fatigue: Denies Smoking or chewing tobacco? Denies  Using fluoride toothpaste daily? Yes  Other notable issues, if any: None  BP (!) (P) 159/54 (BP Location: Left Arm, Patient Position: Sitting)   Pulse (!) (P) 52   Temp (P) 97.7 F (36.5 C) (Temporal)   Resp (P) 18   Ht (P) 5' 1 (1.549 m)   BMI (P) 27.62 kg/m

## 2024-01-24 ENCOUNTER — Other Ambulatory Visit: Payer: Self-pay

## 2024-01-28 ENCOUNTER — Ambulatory Visit: Admitting: Internal Medicine

## 2024-01-28 ENCOUNTER — Encounter: Payer: Self-pay | Admitting: Internal Medicine

## 2024-01-28 VITALS — BP 118/60 | HR 48 | Temp 97.8°F | Resp 18 | Ht 63.0 in | Wt 147.0 lb

## 2024-01-28 DIAGNOSIS — L97929 Non-pressure chronic ulcer of unspecified part of left lower leg with unspecified severity: Secondary | ICD-10-CM

## 2024-01-28 DIAGNOSIS — R001 Bradycardia, unspecified: Secondary | ICD-10-CM

## 2024-01-28 DIAGNOSIS — I10 Essential (primary) hypertension: Secondary | ICD-10-CM | POA: Diagnosis not present

## 2024-01-28 DIAGNOSIS — I83019 Varicose veins of right lower extremity with ulcer of unspecified site: Secondary | ICD-10-CM

## 2024-01-28 DIAGNOSIS — I83029 Varicose veins of left lower extremity with ulcer of unspecified site: Secondary | ICD-10-CM

## 2024-01-28 DIAGNOSIS — N184 Chronic kidney disease, stage 4 (severe): Secondary | ICD-10-CM | POA: Diagnosis not present

## 2024-01-28 DIAGNOSIS — L97919 Non-pressure chronic ulcer of unspecified part of right lower leg with unspecified severity: Secondary | ICD-10-CM

## 2024-01-28 DIAGNOSIS — E039 Hypothyroidism, unspecified: Secondary | ICD-10-CM

## 2024-01-28 DIAGNOSIS — E782 Mixed hyperlipidemia: Secondary | ICD-10-CM

## 2024-01-28 MED ORDER — DILTIAZEM HCL ER COATED BEADS 120 MG PO CP24: 120.0000 mg | ORAL_CAPSULE | Freq: Every day | ORAL | 11 refills | Status: AC

## 2024-01-28 NOTE — Progress Notes (Signed)
 Office Visit  Subjective   Patient ID: April Hodges   DOB: 10/07/1925   Age: 89 y.o.   MRN: [id number]   Chief Complaint Chief Complaint  Patient presents with   office visit    Patient here for follow up     History of Present Illness 89 years old female who is here for follow-up with her son. She says that her wounds are healing , she still has a few spots of skin breakdown in her both legs but she says that it does not bother her.  She has chronic venous stasis with ulcers and not both were applied before.  She is able to ambulate.  She is 89 years old who take care of herself but she gets tired easily and per son she skipped her meal because she cannot go to dining room.  They have a private sitter who comes during daytime to help her out in case if you need anything at home.  They have applied through her life insurance to see if they will cover.    She has swelling in her leg and when the swelling was worse she has increased the dose of Lasix  to 80 mg in the morning but currently she has cut back to 40 mg daily.  She also takes Farxiga  5 mg daily.  She has a chronic kidney disease stage IV and on her last blood draw that was done in July her GFR was 19.  I have suggested to see nephrologist for medical management of chronic kidney disease but she told me that she is very satisfied with her life and she do not wanted to do that.  Her son is here and he suggested that maybe she need to go to see the kidney doctor for medical management of chronic kidney disease stage IV.    She also has bradycardia with a heart rate of 48.  She takes Cardizem  to 40 mg daily.  She has hypertension and her blood pressure is well-controlled.  She also takes losartan  25 mg daily.     She also has hypothyroidism and she takes levothyroxine  125 mcg daily.  She does not have any symptoms of hypo or hyperthyroidism.  Her TSH in May was 1.6 that was therapeutic.    Past Medical History Past Medical History:   Diagnosis Date   Acute on chronic renal failure 06/03/2015   Arthritis    Cancer (HCC)    skin cancer   CKD (chronic kidney disease), stage III (HCC)    Demand ischemia (HCC)    a. 05/2015 in setting of sepsis w/ troponin peak of 8.47;  b. 05/2015 Echo: EF 60-65%, no rwma, Gr1 DD-->No ischemic eval undertaken.   Essential hypertension 06/03/2015   GERD (gastroesophageal reflux disease)    Headache    in younger years   Hypothyroidism    Pneumonia    as a child   PONV (postoperative nausea and vomiting)    Sinus bradycardia    a. states rates in 40's and low 50's are normal for her-->No AVN blocking agents.   Skin cancer    Systolic murmur    a. 05/2015 No sigificant valvular dzs on echo.   Urosepsis 06/03/2015     Allergies Allergies  Allergen Reactions   Bystolic  [Nebivolol  Hcl] Other (See Comments)    Bradycardia - HR in the 40s   Amlodipine  Swelling    Coughing as well     Review of Systems Review of Systems  Constitutional: Negative.   HENT: Negative.    Respiratory: Negative.    Cardiovascular:  Positive for leg swelling.  Gastrointestinal: Negative.   Skin:        Venous ulcer both legs  Neurological: Negative.        Objective:    Vitals BP 118/60   Pulse (!) 48   Temp 97.8 F (36.6 C)   Resp 18   Ht 5' 3 (1.6 m)   Wt 147 lb (66.7 kg)   SpO2 98%   BMI 26.04 kg/m    Physical Examination Physical Exam Constitutional:      Appearance: Normal appearance.  HENT:     Head: Normocephalic and atraumatic.  Eyes:     Extraocular Movements: Extraocular movements intact.     Pupils: Pupils are equal, round, and reactive to light.  Cardiovascular:     Rate and Rhythm: Normal rate and regular rhythm.     Heart sounds: Normal heart sounds.  Pulmonary:     Effort: Pulmonary effort is normal.     Breath sounds: Normal breath sounds.  Abdominal:     General: Bowel sounds are normal.     Palpations: Abdomen is soft.  Musculoskeletal:     Right lower  leg: Edema present.     Left lower leg: Edema present.     Comments:   She has multiple spots of skin breakdown in both legs.  It is actually is getting better and she do not wanted to use unna boot.  Neurological:     General: No focal deficit present.     Mental Status: She is alert and oriented to person, place, and time.        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    She do not wanted to use Unna boot. Will continue to monitor.  Sinus bradycardia   Her heart rate is 48. I will decrease the dose of diltiazem  to 120 mg daily.  She will continue to monitor her blood pressure.  Essential hypertension   Her blood pressure is controlled.  CKD (chronic kidney disease), stage IV (HCC)   I have discussed with her and her son, her GFR was 19.  She is 21 years old is not a candidate for hemodialysis but I will refer her to see nephrologist for medical management of CKD.  She has decrease the dose of Lasix  to 40 mg daily.  She also takes Farxiga  5 mg daily.  Hypothyroidism (acquired)   Her TSH was therapeutic so will continue the current dose  Of levothyroxine  of 137 mcg daily.    No follow-ups on file.   [name], MD

## 2024-02-02 NOTE — Assessment & Plan Note (Signed)
 Her leg wounds her healing nicely.    She do not wanted to use Unna boot. Will continue to monitor.

## 2024-02-02 NOTE — Assessment & Plan Note (Signed)
 Her TSH was therapeutic so will continue the current dose  Of levothyroxine  of 137 mcg daily.

## 2024-02-02 NOTE — Assessment & Plan Note (Signed)
 Her blood pressure is controlled.

## 2024-02-02 NOTE — Assessment & Plan Note (Addendum)
 I have discussed with her and her son, her GFR was 19.  She is 89 years old is not a candidate for hemodialysis but I will refer her to see nephrologist for medical management of CKD.  She has decrease the dose of Lasix  to 40 mg daily.  She also takes Farxiga  5 mg daily.

## 2024-02-02 NOTE — Assessment & Plan Note (Signed)
 Her heart rate is 48. I will decrease the dose of diltiazem  to 120 mg daily.  She will continue to monitor her blood pressure.
